# Patient Record
Sex: Female | Born: 1935 | Race: White | Hispanic: No | Marital: Married | Smoking: Former smoker
Health system: Southern US, Community
[De-identification: ages and names within clinical notes are randomized; demographics above are authoritative.]

## PROBLEM LIST (undated history)

## (undated) DIAGNOSIS — E079 Disorder of thyroid, unspecified: Secondary | ICD-10-CM

## (undated) DIAGNOSIS — E785 Hyperlipidemia, unspecified: Secondary | ICD-10-CM

## (undated) DIAGNOSIS — F0280 Dementia in other diseases classified elsewhere without behavioral disturbance: Secondary | ICD-10-CM

## (undated) DIAGNOSIS — I10 Essential (primary) hypertension: Secondary | ICD-10-CM

## (undated) DIAGNOSIS — F0390 Unspecified dementia without behavioral disturbance: Secondary | ICD-10-CM

## (undated) DIAGNOSIS — C801 Malignant (primary) neoplasm, unspecified: Secondary | ICD-10-CM

## (undated) DIAGNOSIS — E46 Unspecified protein-calorie malnutrition: Secondary | ICD-10-CM

## (undated) DIAGNOSIS — G309 Alzheimer's disease, unspecified: Secondary | ICD-10-CM

## (undated) HISTORY — DX: Essential (primary) hypertension: I10

## (undated) HISTORY — DX: Malignant (primary) neoplasm, unspecified: C80.1

## (undated) HISTORY — DX: Unspecified dementia, unspecified severity, without behavioral disturbance, psychotic disturbance, mood disturbance, and anxiety: F03.90

---

## 2011-01-20 HISTORY — PX: MASTECTOMY: SHX3

## 2011-01-20 HISTORY — PX: LUNG REMOVAL, PARTIAL: SHX233

## 2012-06-15 HISTORY — PX: OTHER SURGICAL HISTORY: SHX169

## 2014-08-27 ENCOUNTER — Telehealth: Payer: Self-pay | Admitting: Hematology

## 2014-08-27 NOTE — Telephone Encounter (Signed)
New patient appt-s/w patient dtr [name] and gave np appt for 08/09 @ 1:30 w/Dr. Irene Limbo.

## 2014-08-28 ENCOUNTER — Ambulatory Visit: Payer: Medicare HMO

## 2014-08-28 ENCOUNTER — Encounter: Payer: Self-pay | Admitting: Hematology

## 2014-08-28 ENCOUNTER — Telehealth: Payer: Self-pay | Admitting: Hematology

## 2014-08-28 ENCOUNTER — Ambulatory Visit (HOSPITAL_BASED_OUTPATIENT_CLINIC_OR_DEPARTMENT_OTHER): Payer: Medicare HMO | Admitting: Hematology

## 2014-08-28 VITALS — BP 153/90 | HR 66 | Temp 98.1°F | Resp 18 | Ht 69.0 in | Wt 109.3 lb

## 2014-08-28 DIAGNOSIS — C7800 Secondary malignant neoplasm of unspecified lung: Secondary | ICD-10-CM | POA: Diagnosis not present

## 2014-08-28 DIAGNOSIS — C50911 Malignant neoplasm of unspecified site of right female breast: Secondary | ICD-10-CM

## 2014-08-28 DIAGNOSIS — D0502 Lobular carcinoma in situ of left breast: Secondary | ICD-10-CM

## 2014-08-28 DIAGNOSIS — D0501 Lobular carcinoma in situ of right breast: Secondary | ICD-10-CM

## 2014-08-28 DIAGNOSIS — C50912 Malignant neoplasm of unspecified site of left female breast: Secondary | ICD-10-CM | POA: Insufficient documentation

## 2014-08-28 DIAGNOSIS — C439 Malignant melanoma of skin, unspecified: Secondary | ICD-10-CM | POA: Insufficient documentation

## 2014-08-28 DIAGNOSIS — C799 Secondary malignant neoplasm of unspecified site: Secondary | ICD-10-CM

## 2014-08-28 NOTE — Telephone Encounter (Signed)
Pt confirmed labs/ov per 08/09 POF, gave pt avs and calendar.Cherylann Banas, sent msg to MD about chemo treatment plan and lab orders

## 2014-08-28 NOTE — Progress Notes (Signed)
[facility]  [phone] Fax:([phone]) [phone]  Clinic New Consult Note   Patient Care Team: Derinda Late, MD as PCP - General (Family Medicine) 08/28/2014  CHIEF COMPLAINTS/PURPOSE OF CONSULTATION:  Metastatic melanoma, history of bilateral breast cancer  Oncology History   Metastatic melanoma   Staging form: Melanoma of the Skin, AJCC 7th Edition     Clinical: Stage IV ([place], NX, M1b) - Unsigned          Metastatic melanoma   11/03/2011 Imaging PET CT scan showed hypermetabolic nodule in the lingular, 3 cm, consistent with known metastatic disease, hypermetabolic focus in the medial right thigh, without visible lesion on CT. Mild activity in anterior lateral chest wall favoring postsurgical    2013 Initial Diagnosis Metastatic melanoma   10/21/2011 Initial Biopsy Left lung mass biopsy, tumor is present suggests melanoma.   12/02/2011 Surgery Left alone mass which resection, metastatic melanoma, parenchymal margin negative. Lingular mass, negative margins. [place] staining consistent with melanoma.   01/15/2012 Miscellaneous BRAF and c-kit mutation and lysis were negative   06/29/2012 -  Chemotherapy Yervoy, followed by [facility]ock, and currently on Opdivo    11/21/2013 Surgery Resection of metastatic melanoma in right lateral back and right upper arm, past was consistent with melanoma. Margins were negative.   05/17/2014 Imaging Brain MRI showed diffuse cerebral atrophy with superimposed small vessel ischemia disease stable from prior exam. No evidence of intracranial metastasis.   05/17/2014 Imaging CT of the chest, abdomen and pelvis showed no findings to suggest metastatic disease. Decreased in size of perifascial fat of the right flank, previous right pelvic node no longer seen.    Breast cancer, right breast   09/26/1999 Initial Diagnosis Right breast lumpectomy showed infiltrating lobular carcinoma, grade 2, T2N1, surgical margins were negative. Laboratory  carcinoma in situ.   09/26/1999 Receptors her2 ER 70% positive, PR 10% positive, HER-2 negative.   2001 -  Adjuvant Chemotherapy Adjuvant Adriamycin, Cytoxan and Taxol.   2001 -  Radiation Therapy Adjuvant breast radiation   2002 - 2007 Anti-estrogen oral therapy Tamoxifen for one year, followed by Arimidex which was later on switched to Aromasin due to side effects, for total 5 years.    Breast cancer, left breast   05/28/2011 Initial Diagnosis Breast cancer, left breast   05/28/2011 Initial Biopsy Left breast at 12:00 position biopsy showed ductal carcinoma in situ high-grade, left breast 3 clock position biopsy showed infiltrating lobular carcinoma.   05/28/2011 Receptors her2 ER positive, PR positive, HER-2 negative. Oncotype recurrence score 13   06/19/2011 Surgery Right breast exertion, no cancer. Sentinel lymph node negative. Left breast exertion showed focal invasive lobular carcinoma 8 mm maximal dimension. Grade 1, DCIS present. Total 41 is negative.   HISTORY OF PRESENTING ILLNESS:  Alexa Hardin 81 y.o. female is here to transfer her oncological care to [place] due to the recent relocation. She is accompanied by her husband and daughter to the clinic today.  Please see the summary for her oncological issue above. In summary, she had a stage II of right breast lobular carcinoma in 2001, and stage I left breast lobular carcinoma in 2013, and currently on adjuvant Femara. She was diagnosed with metastatic melanoma to lung, subcutaneous and the lymph nodes in October 2013, status post surgical resection of the lung metastases, and to subcutaneous lesion. She has been on immunotherapy since then, including YervoyX4, followed by [facility], and currently on [name] Sep 2015. She has been tolerating treatment very well, with only mild  fatigue, no significant other side effects.  She used to live with her husband in [place], was recently moved to [place]: To be with her daughter and her  family. Her family members have noticed worsening memory loss, some cognitive dysfunction, and some personality change, such as anger control, in the past several months. One day a few weeks ago, she was found wandering in the parking lot outside of her hotel room at 4:00 in the morning. She needs supervision for her daily activities, such as showering, taking medication. Patient denies any of the above issues, but her conversation was disorganized, and had significant short-term memory issue when I tested her.  She has low to moderate appetite ,  on Megace, eats small portion of female, she lost about 20 lbs in the past two years, No significant weight loss lately. She denies any significant pain, dyspnea, abdominal discomfort, or change of her bowel habits. She is able to ambulatory independently without difficulty. She is not very physically active, but able to tolerate daily routine activity.   MEDICAL HISTORY:  Past Medical History  Diagnosis Date  . Hypertension     SURGICAL HISTORY: Past Surgical History  Procedure Laterality Date  . Lung removal, partial Left 2013    SOCIAL HISTORY: History   Social History  . Marital Status: Married    Spouse Name: N/A  . Number of Children: N/A  . Years of Education: N/A   Occupational History  . Not on file.   Social History Main Topics  . Smoking status: Former Smoker -- 1.50 packs/day for 30 years    Quit date: 01/20/1983  . Smokeless tobacco: Not on file  . Alcohol Use: 0.6 oz/week    1 Glasses of wine per week     Comment: every a few days   . Drug Use: No  . Sexual Activity: Not on file   Other Topics Concern  . Not on file   Social History Narrative  . No narrative on file    FAMILY HISTORY: Family History  Problem Relation Age of Onset  . Cancer Father     lung cancer    ALLERGIES:  is allergic to quinolones.  MEDICATIONS:  Current Outpatient Prescriptions  Medication Sig Dispense Refill  . aspirin 81 MG tablet  Take 81 mg by mouth daily.    Marland Kitchen atenolol (TENORMIN) 50 MG tablet Take 50 mg by mouth daily.    . Cholecalciferol (VITAMIN D3) 1000 UNITS CAPS Take 1 capsule by mouth daily.    Marland Kitchen letrozole (FEMARA) 2.5 MG tablet Take 2.5 mg by mouth daily.    Marland Kitchen levothyroxine (SYNTHROID, LEVOTHROID) 50 MCG tablet Take 50 mcg by mouth daily before breakfast.    . losartan (COZAAR) 100 MG tablet Take 100 mg by mouth daily.    . megestrol (MEGACE ES) 625 MG/5ML suspension Take 625 mg by mouth daily.    Marland Kitchen NIFEdipine (PROCARDIA-XL/ADALAT-CC/NIFEDICAL-XL) 30 MG 24 hr tablet Take 30 mg by mouth daily.    . simvastatin (ZOCOR) 40 MG tablet Take 40 mg by mouth daily.     No current facility-administered medications for this visit.    REVIEW OF SYSTEMS:   Constitutional: Denies fevers, chills or abnormal night sweats Eyes: Denies blurriness of vision, double vision or watery eyes Ears, nose, mouth, throat, and face: Denies mucositis or sore throat Respiratory: Denies cough, dyspnea or wheezes Cardiovascular: Denies palpitation, chest discomfort or lower extremity swelling Gastrointestinal:  Denies nausea, heartburn or change in bowel  habits Skin: Denies abnormal skin rashes Lymphatics: Denies new lymphadenopathy or easy bruising Neurological:Denies numbness, tingling or new weaknesses Behavioral/Psych: Mood is stable, no new changes  All other systems were reviewed with the patient and are negative.  PHYSICAL EXAMINATION: ECOG PERFORMANCE STATUS: 2 - Symptomatic, <50% confined to bed  Filed Vitals:   08/28/14 1539  BP: 153/90  Pulse: 66  Temp: 98.1 F (36.7 C)  Resp: 18   Filed Weights   08/28/14 1539  Weight: 109 lb 4.8 oz (49.578 kg)    GENERAL:alert, no distress and comfortable SKIN: skin color, texture, turgor are normal, no rashes or significant lesions EYES: normal, conjunctiva are pink and non-injected, sclera clear OROPHARYNX:no exudate, no erythema and lips, buccal mucosa, and tongue normal    NECK: supple, thyroid normal size, non-tender, without nodularity LYMPH:  no palpable lymphadenopathy in the cervical, axillary or inguinal LUNGS: clear to auscultation and percussion with normal breathing effort HEART: regular rate & rhythm and no murmurs and no lower extremity edema ABDOMEN:abdomen soft, non-tender and normal bowel sounds Musculoskeletal:no cyanosis of digits and no clubbing  PSYCH: alert & oriented x 3 with fluent speech NEURO: no focal motor/sensory deficits  LABORATORY DATA:  I have reviewed the data as listed  I have reviewed her outside lab from 08/07/2014 CMP within normal limits except BUN 31 TSH normal lipase and LDH normal CBC WBC 6.9, hemoglobin 11.6, platelet 221K  RADIOGRAPHIC STUDIES: I have personally reviewed the radiological images as listed and agreed with the findings in the report. No results found.  ASSESSMENT & PLAN: 81 year old Caucasian female  1. Metastatic melanoma to lung, abdominal wall, subcutaneous soft tissue and lymph nodes -We reviewed her melanoma is incurable at this stage, and the goal of therapy is disease control and prolong her life  -she has been doing very well on immunotherapy. Her last CT scan in April 2016 showed no evidence of disease -I recommend to continue to follow him every 2 weeks, we'll restart next week in our infusion center -The potential side effects, especially immune related reactions, will discuss with her family members again, they voiced good understanding -Giving the underlying dementia and advanced age, she would not be a good candidate for chemotherapy if her disease progress -I'll check her lab CBC, CMP before each treatment, monitor TSH monthly -Repeat CT scans in a month, and every 4-6 months afterwards if she is clinically stable  2. History of bilateral breast lobular carcinoma -She is still on adjuvant Femara, we'll continue, a total 5 years -She is status post bilateral mastectomy, no need  mammogram -I'll follow-up clinically   3. Dementia -She has never been diagnosed, but her clinical symptoms are suggestive of moderate dementia, her last brain MRI showed brain atrophy and ischemic change -I suggest her to get a neurologist referral from her primary care physician  3. Hypertension -Continue follow-up with primary care physician  Plan -lab and restart Nivo in one week -I will see her back in 3 weeks  -lab before each treatment through her port   Orders Placed This Encounter  Procedures  . TSH    Standing Status: Standing     Number of Occurrences: 30     Standing Expiration Date: 08/27/2017  . Comprehensive metabolic panel    Standing Status: Standing     Number of Occurrences: 30     Standing Expiration Date: 08/27/2017  . CBC with Differential/Platelet    Standing Status: Standing     Number of Occurrences:  30     Standing Expiration Date: 08/27/2017    All questions were answered. The patient knows to call the clinic with any problems, questions or concerns. I spent 55 minutes counseling the patient face to face. The total time spent in the appointment was 60 minutes and more than 50% was on counseling.     Truitt Merle, MD 08/28/2014 5:35 PM

## 2014-08-30 ENCOUNTER — Other Ambulatory Visit: Payer: Self-pay | Admitting: *Deleted

## 2014-08-30 ENCOUNTER — Telehealth: Payer: Self-pay | Admitting: *Deleted

## 2014-08-30 ENCOUNTER — Telehealth: Payer: Self-pay | Admitting: Hematology

## 2014-08-30 DIAGNOSIS — C439 Malignant melanoma of skin, unspecified: Secondary | ICD-10-CM

## 2014-08-30 DIAGNOSIS — C799 Secondary malignant neoplasm of unspecified site: Secondary | ICD-10-CM

## 2014-08-30 NOTE — Telephone Encounter (Signed)
Appointments made and daughter aware

## 2014-08-30 NOTE — Telephone Encounter (Signed)
Spoke with daughter [name] and gave her appts for pt to have chest xray done at 1130 am, lab and chemo at 1215 pm on Mon 09/03/14.   Spoke with husband Deidre Ala and gave him same information.  Both [name] and Deidre Ala voiced understanding. [facility]   Phone      [phone].

## 2014-09-03 ENCOUNTER — Other Ambulatory Visit: Payer: Self-pay | Admitting: *Deleted

## 2014-09-03 ENCOUNTER — Ambulatory Visit (HOSPITAL_BASED_OUTPATIENT_CLINIC_OR_DEPARTMENT_OTHER): Payer: Medicare HMO

## 2014-09-03 ENCOUNTER — Ambulatory Visit (HOSPITAL_COMMUNITY)
Admission: RE | Admit: 2014-09-03 | Discharge: 2014-09-03 | Disposition: A | Discharge status: Home / Self Care | Payer: Medicare HMO | Source: Ambulatory Visit | Attending: Hematology | Admitting: Hematology

## 2014-09-03 ENCOUNTER — Ambulatory Visit: Payer: Medicare HMO

## 2014-09-03 ENCOUNTER — Other Ambulatory Visit (HOSPITAL_BASED_OUTPATIENT_CLINIC_OR_DEPARTMENT_OTHER): Payer: Medicare HMO

## 2014-09-03 VITALS — BP 154/70 | HR 56 | Temp 97.5°F | Resp 20

## 2014-09-03 DIAGNOSIS — Z79899 Other long term (current) drug therapy: Secondary | ICD-10-CM

## 2014-09-03 DIAGNOSIS — C50911 Malignant neoplasm of unspecified site of right female breast: Secondary | ICD-10-CM

## 2014-09-03 DIAGNOSIS — C50919 Malignant neoplasm of unspecified site of unspecified female breast: Secondary | ICD-10-CM

## 2014-09-03 DIAGNOSIS — C3490 Malignant neoplasm of unspecified part of unspecified bronchus or lung: Secondary | ICD-10-CM

## 2014-09-03 DIAGNOSIS — C50912 Malignant neoplasm of unspecified site of left female breast: Secondary | ICD-10-CM

## 2014-09-03 DIAGNOSIS — C439 Malignant melanoma of skin, unspecified: Secondary | ICD-10-CM

## 2014-09-03 DIAGNOSIS — I517 Cardiomegaly: Secondary | ICD-10-CM | POA: Diagnosis not present

## 2014-09-03 DIAGNOSIS — C799 Secondary malignant neoplasm of unspecified site: Secondary | ICD-10-CM

## 2014-09-03 DIAGNOSIS — Z5112 Encounter for antineoplastic immunotherapy: Secondary | ICD-10-CM | POA: Diagnosis not present

## 2014-09-03 DIAGNOSIS — R918 Other nonspecific abnormal finding of lung field: Secondary | ICD-10-CM | POA: Diagnosis not present

## 2014-09-03 LAB — COMPREHENSIVE METABOLIC PANEL (CC13)
ALBUMIN: 3.4 g/dL — AB (ref 3.5–5.0)
ALT: 11 U/L (ref 0–55)
AST: 17 U/L (ref 5–34)
Alkaline Phosphatase: 41 U/L (ref 40–150)
Anion Gap: 8 mEq/L (ref 3–11)
BILIRUBIN TOTAL: 1.04 mg/dL (ref 0.20–1.20)
BUN: 22.3 mg/dL (ref 7.0–26.0)
CO2: 22 mEq/L (ref 22–29)
Calcium: 9 mg/dL (ref 8.4–10.4)
Chloride: 109 mEq/L (ref 98–109)
Creatinine: 1 mg/dL (ref 0.6–1.1)
EGFR: 53 mL/min/{1.73_m2} — AB (ref >90)
GLUCOSE: 170 mg/dL — AB (ref 70–140)
Potassium: 3.8 mEq/L (ref 3.5–5.1)
SODIUM: 139 meq/L (ref 136–145)
TOTAL PROTEIN: 6.5 g/dL (ref 6.4–8.3)

## 2014-09-03 LAB — CBC WITH DIFFERENTIAL/PLATELET
BASO%: 1 % (ref 0.0–2.0)
Basophils Absolute: 0.1 10*3/uL (ref 0.0–0.1)
EOS%: 0.2 % (ref 0.0–7.0)
Eosinophils Absolute: 0 10*3/uL (ref 0.0–0.5)
HEMATOCRIT: 38.2 % (ref 34.8–46.6)
HGB: 12.5 g/dL (ref 11.6–15.9)
LYMPH#: 1.4 10*3/uL (ref 0.9–3.3)
LYMPH%: 14.6 % (ref 14.0–49.7)
MCH: 26.8 pg (ref 25.1–34.0)
MCHC: 32.7 g/dL (ref 31.5–36.0)
MCV: 81.9 fL (ref 79.5–101.0)
MONO#: 0.7 10*3/uL (ref 0.1–0.9)
MONO%: 7.6 % (ref 0.0–14.0)
NEUT#: 7.3 10*3/uL — ABNORMAL HIGH (ref 1.5–6.5)
NEUT%: 76.6 % (ref 38.4–76.8)
Platelets: 260 10*3/uL (ref 145–400)
RBC: 4.66 10*6/uL (ref 3.70–5.45)
RDW: 16.2 % — ABNORMAL HIGH (ref 11.2–14.5)
WBC: 9.6 10*3/uL (ref 3.9–10.3)

## 2014-09-03 LAB — TSH CHCC: TSH: 5.43 m[IU]/L — AB (ref 0.308–3.960)

## 2014-09-03 MED ORDER — HEPARIN SOD (PORK) LOCK FLUSH 100 UNIT/ML IV SOLN
500.0000 [IU] | Freq: Once | INTRAVENOUS | Status: AC | PRN
  Administered 2014-09-03: 500 [IU]
  Filled 2014-09-03: qty 5

## 2014-09-03 MED ORDER — SODIUM CHLORIDE 0.9 % IJ SOLN
10.0000 mL | INTRAMUSCULAR | Status: DC | PRN
  Administered 2014-09-03: 10 mL via INTRAVENOUS
  Filled 2014-09-03: qty 10

## 2014-09-03 MED ORDER — MEGESTROL ACETATE 625 MG/5ML PO SUSP
625.0000 mg | Freq: Every day | ORAL | Status: AC
Start: 2014-09-03 — End: ?

## 2014-09-03 MED ORDER — SODIUM CHLORIDE 0.9 % IJ SOLN
10.0000 mL | INTRAMUSCULAR | Status: DC | PRN
  Administered 2014-09-03: 10 mL
  Filled 2014-09-03: qty 10

## 2014-09-03 MED ORDER — NIVOLUMAB CHEMO INJECTION 100 MG/10ML: 3.0000 mg/kg | Freq: Once | INTRAVENOUS | Status: DC

## 2014-09-03 MED ORDER — HEPARIN SOD (PORK) LOCK FLUSH 100 UNIT/ML IV SOLN
500.0000 [IU] | Freq: Once | INTRAVENOUS | Status: DC
  Filled 2014-09-03: qty 5

## 2014-09-03 MED ORDER — SODIUM CHLORIDE 0.9 % IV SOLN
3.2000 mg/kg | Freq: Once | INTRAVENOUS | Status: AC
  Administered 2014-09-03: 160 mg via INTRAVENOUS
  Filled 2014-09-03: qty 16

## 2014-09-03 MED ORDER — SODIUM CHLORIDE 0.9 % IV SOLN
Freq: Once | INTRAVENOUS | Status: AC
  Administered 2014-09-03: 13:00:00 via INTRAVENOUS

## 2014-09-03 MED ORDER — LETROZOLE 2.5 MG PO TABS: 2.5000 mg | ORAL_TABLET | Freq: Every day | ORAL | Status: DC

## 2014-09-03 NOTE — Patient Instructions (Signed)
[facility] Discharge Instructions for Patients Receiving Chemotherapy  Today you received the following chemotherapy agents:  [name].  To help prevent nausea and vomiting after your treatment, we encourage you to take your nausea medication as directed.   If you develop nausea and vomiting that is not controlled by your nausea medication, call the clinic.   BELOW ARE SYMPTOMS THAT SHOULD BE REPORTED IMMEDIATELY:  *FEVER GREATER THAN 100.5 F  *CHILLS WITH OR WITHOUT FEVER  NAUSEA AND VOMITING THAT IS NOT CONTROLLED WITH YOUR NAUSEA MEDICATION  *UNUSUAL SHORTNESS OF BREATH  *UNUSUAL BRUISING OR BLEEDING  TENDERNESS IN MOUTH AND THROAT WITH OR WITHOUT PRESENCE OF ULCERS  *URINARY PROBLEMS  *BOWEL PROBLEMS  UNUSUAL RASH Items with * indicate a potential emergency and should be followed up as soon as possible.  Feel free to call the clinic you have any questions or concerns. The clinic phone number is [phone].  Please show the CHEMO ALERT CARD at check-in to the Emergency Department and triage nurse.   

## 2014-09-03 NOTE — Patient Instructions (Signed)

## 2014-09-04 ENCOUNTER — Encounter: Payer: Self-pay | Admitting: Hematology and Oncology

## 2014-09-17 ENCOUNTER — Ambulatory Visit: Payer: Medicare HMO

## 2014-09-18 ENCOUNTER — Other Ambulatory Visit: Payer: Self-pay | Admitting: *Deleted

## 2014-09-18 ENCOUNTER — Ambulatory Visit: Payer: Medicare HMO

## 2014-09-18 ENCOUNTER — Telehealth: Payer: Self-pay | Admitting: Hematology

## 2014-09-18 ENCOUNTER — Encounter: Payer: Self-pay | Admitting: Hematology

## 2014-09-18 ENCOUNTER — Ambulatory Visit (HOSPITAL_BASED_OUTPATIENT_CLINIC_OR_DEPARTMENT_OTHER): Payer: Medicare HMO

## 2014-09-18 ENCOUNTER — Other Ambulatory Visit (HOSPITAL_BASED_OUTPATIENT_CLINIC_OR_DEPARTMENT_OTHER): Payer: Medicare HMO

## 2014-09-18 ENCOUNTER — Ambulatory Visit (HOSPITAL_BASED_OUTPATIENT_CLINIC_OR_DEPARTMENT_OTHER): Payer: Medicare HMO | Admitting: Hematology

## 2014-09-18 ENCOUNTER — Telehealth: Payer: Self-pay | Admitting: *Deleted

## 2014-09-18 VITALS — BP 122/68 | HR 60 | Resp 18 | Ht 69.0 in | Wt 111.0 lb

## 2014-09-18 DIAGNOSIS — C439 Malignant melanoma of skin, unspecified: Secondary | ICD-10-CM

## 2014-09-18 DIAGNOSIS — C50912 Malignant neoplasm of unspecified site of left female breast: Secondary | ICD-10-CM

## 2014-09-18 DIAGNOSIS — D0502 Lobular carcinoma in situ of left breast: Secondary | ICD-10-CM

## 2014-09-18 DIAGNOSIS — C50911 Malignant neoplasm of unspecified site of right female breast: Secondary | ICD-10-CM

## 2014-09-18 DIAGNOSIS — E039 Hypothyroidism, unspecified: Secondary | ICD-10-CM

## 2014-09-18 DIAGNOSIS — Z79811 Long term (current) use of aromatase inhibitors: Secondary | ICD-10-CM

## 2014-09-18 DIAGNOSIS — C799 Secondary malignant neoplasm of unspecified site: Secondary | ICD-10-CM

## 2014-09-18 DIAGNOSIS — C3490 Malignant neoplasm of unspecified part of unspecified bronchus or lung: Secondary | ICD-10-CM | POA: Diagnosis not present

## 2014-09-18 DIAGNOSIS — D0501 Lobular carcinoma in situ of right breast: Secondary | ICD-10-CM | POA: Diagnosis not present

## 2014-09-18 DIAGNOSIS — Z95828 Presence of other vascular implants and grafts: Secondary | ICD-10-CM

## 2014-09-18 DIAGNOSIS — Z5112 Encounter for antineoplastic immunotherapy: Secondary | ICD-10-CM | POA: Diagnosis not present

## 2014-09-18 LAB — COMPREHENSIVE METABOLIC PANEL (CC13)
ALT: 12 U/L (ref 0–55)
ANION GAP: 8 meq/L (ref 3–11)
AST: 19 U/L (ref 5–34)
Albumin: 3.5 g/dL (ref 3.5–5.0)
Alkaline Phosphatase: 38 U/L — ABNORMAL LOW (ref 40–150)
BILIRUBIN TOTAL: 0.86 mg/dL (ref 0.20–1.20)
BUN: 29.7 mg/dL — ABNORMAL HIGH (ref 7.0–26.0)
CALCIUM: 9 mg/dL (ref 8.4–10.4)
CHLORIDE: 109 meq/L (ref 98–109)
CO2: 24 meq/L (ref 22–29)
Creatinine: 1.3 mg/dL — ABNORMAL HIGH (ref 0.6–1.1)
EGFR: 41 mL/min/{1.73_m2} — AB (ref >90)
Glucose: 104 mg/dl (ref 70–140)
Potassium: 4.3 mEq/L (ref 3.5–5.1)
Sodium: 141 mEq/L (ref 136–145)
Total Protein: 6.6 g/dL (ref 6.4–8.3)

## 2014-09-18 LAB — CBC WITH DIFFERENTIAL/PLATELET
BASO%: 0.6 % (ref 0.0–2.0)
BASOS ABS: 0.1 10*3/uL (ref 0.0–0.1)
EOS ABS: 0 10*3/uL (ref 0.0–0.5)
EOS%: 0.1 % (ref 0.0–7.0)
HEMATOCRIT: 37.7 % (ref 34.8–46.6)
HEMOGLOBIN: 12.4 g/dL (ref 11.6–15.9)
LYMPH#: 1.6 10*3/uL (ref 0.9–3.3)
LYMPH%: 16.9 % (ref 14.0–49.7)
MCH: 26.9 pg (ref 25.1–34.0)
MCHC: 32.8 g/dL (ref 31.5–36.0)
MCV: 81.9 fL (ref 79.5–101.0)
MONO#: 0.8 10*3/uL (ref 0.1–0.9)
MONO%: 8.5 % (ref 0.0–14.0)
NEUT%: 73.9 % (ref 38.4–76.8)
NEUTROS ABS: 7.1 10*3/uL — AB (ref 1.5–6.5)
Platelets: 286 10*3/uL (ref 145–400)
RBC: 4.6 10*6/uL (ref 3.70–5.45)
RDW: 16.6 % — AB (ref 11.2–14.5)
WBC: 9.6 10*3/uL (ref 3.9–10.3)

## 2014-09-18 MED ORDER — SODIUM CHLORIDE 0.9 % IJ SOLN
10.0000 mL | INTRAMUSCULAR | Status: DC | PRN
  Administered 2014-09-18: 10 mL
  Filled 2014-09-18: qty 10

## 2014-09-18 MED ORDER — SODIUM CHLORIDE 0.9 % IJ SOLN
10.0000 mL | INTRAMUSCULAR | Status: DC | PRN
  Administered 2014-09-18: 10 mL via INTRAVENOUS
  Filled 2014-09-18: qty 10

## 2014-09-18 MED ORDER — HEPARIN SOD (PORK) LOCK FLUSH 100 UNIT/ML IV SOLN
500.0000 [IU] | Freq: Once | INTRAVENOUS | Status: AC | PRN
  Administered 2014-09-18: 500 [IU]
  Filled 2014-09-18: qty 5

## 2014-09-18 MED ORDER — SODIUM CHLORIDE 0.9 % IV SOLN
Freq: Once | INTRAVENOUS | Status: AC
  Administered 2014-09-18: 14:00:00 via INTRAVENOUS

## 2014-09-18 MED ORDER — SODIUM CHLORIDE 0.9 % IV SOLN
160.0000 mg | Freq: Once | INTRAVENOUS | Status: AC
  Administered 2014-09-18: 160 mg via INTRAVENOUS
  Filled 2014-09-18: qty 16

## 2014-09-18 NOTE — Patient Instructions (Signed)

## 2014-09-18 NOTE — Progress Notes (Signed)
Pt's last TSH 8/15 was elevated; per Dr. Burr Medico, okay to tx today; Dr. Burr Medico to discuss thyroid issue with pt.

## 2014-09-18 NOTE — Telephone Encounter (Signed)
Added appts...the patient will get printout in chemo

## 2014-09-18 NOTE — Telephone Encounter (Signed)
Per staff message and POF I have scheduled appts. Advised scheduler of appts. JMW  

## 2014-09-18 NOTE — Telephone Encounter (Signed)
per pt spouse to sch flush-sent MW email to sch coodinate trmt w/MD sch-adv pt will call after reply

## 2014-09-18 NOTE — Patient Instructions (Signed)
[facility] Discharge Instructions for Patients Receiving Chemotherapy  Today you received the following chemotherapy agents: [name]  To help prevent nausea and vomiting after your treatment, we encourage you to take your nausea medication as prescribed by your physician.    If you develop nausea and vomiting that is not controlled by your nausea medication, call the clinic.   BELOW ARE SYMPTOMS THAT SHOULD BE REPORTED IMMEDIATELY:  *FEVER GREATER THAN 100.5 F  *CHILLS WITH OR WITHOUT FEVER  NAUSEA AND VOMITING THAT IS NOT CONTROLLED WITH YOUR NAUSEA MEDICATION  *UNUSUAL SHORTNESS OF BREATH  *UNUSUAL BRUISING OR BLEEDING  TENDERNESS IN MOUTH AND THROAT WITH OR WITHOUT PRESENCE OF ULCERS  *URINARY PROBLEMS  *BOWEL PROBLEMS  UNUSUAL RASH Items with * indicate a potential emergency and should be followed up as soon as possible.  Feel free to call the clinic you have any questions or concerns. The clinic phone number is [phone].  Please show the CHEMO ALERT CARD at check-in to the Emergency Department and triage nurse.   

## 2014-09-18 NOTE — Progress Notes (Signed)
[place]  Telephone:([phone]) [phone] [phone]  Clinic follow up Note   Patient Care Team: Derinda Late, MD as PCP - General (Family Medicine) 09/18/2014  CHIEF COMPLAINTS/PURPOSE OF CONSULTATION:  Follow up metastatic melanoma  Oncology History   Metastatic melanoma   Staging form: Melanoma of the Skin, AJCC 7th Edition     Clinical: Stage IV ([place], NX, M1b) - Unsigned          Metastatic melanoma   11/03/2011 Imaging PET CT scan showed hypermetabolic nodule in the lingular, 3 cm, consistent with known metastatic disease, hypermetabolic focus in the medial right thigh, without visible lesion on CT. Mild activity in anterior lateral chest wall favoring postsurgical    2013 Initial Diagnosis Metastatic melanoma   10/21/2011 Initial Biopsy Left lung mass biopsy, tumor is present suggests melanoma.   12/02/2011 Surgery Left alone mass which resection, metastatic melanoma, parenchymal margin negative. Lingular mass, negative margins. [place] staining consistent with melanoma.   01/15/2012 Miscellaneous BRAF and c-kit mutation and lysis were negative   06/29/2012 -  Chemotherapy Yervoy, followed by [facility], and currently on Opdivo    11/21/2013 Surgery Resection of metastatic melanoma in right lateral back and right upper arm, past was consistent with melanoma. Margins were negative.   05/17/2014 Imaging Brain MRI showed diffuse cerebral atrophy with superimposed small vessel ischemia disease stable from prior exam. No evidence of intracranial metastasis.   05/17/2014 Imaging CT of the chest, abdomen and pelvis showed no findings to suggest metastatic disease. Decreased in size of perifascial fat of the right flank, previous right pelvic node no longer seen.    Breast cancer, right breast   09/26/1999 Initial Diagnosis Right breast lumpectomy showed infiltrating lobular carcinoma, grade 2, T2N1, surgical margins were negative. Laboratory carcinoma in situ.   09/26/1999 Receptors her2 ER 70% positive, PR 10% positive, HER-2 negative.   2001 -  Adjuvant Chemotherapy Adjuvant Adriamycin, Cytoxan and Taxol.   2001 -  Radiation Therapy Adjuvant breast radiation   2002 - 2007 Anti-estrogen oral therapy Tamoxifen for one year, followed by Arimidex which was later on switched to Aromasin due to side effects, for total 5 years.    Breast cancer, left breast   05/28/2011 Initial Diagnosis Breast cancer, left breast   05/28/2011 Initial Biopsy Left breast at 12:00 position biopsy showed ductal carcinoma in situ high-grade, left breast 3 clock position biopsy showed infiltrating lobular carcinoma.   05/28/2011 Receptors her2 ER positive, PR positive, HER-2 negative. Oncotype recurrence score 13   06/19/2011 Surgery Right breast exertion, no cancer. Sentinel lymph node negative. Left breast exertion showed focal invasive lobular carcinoma 8 mm maximal dimension. Grade 1, DCIS present. Total 41 is negative.   HISTORY OF PRESENTING ILLNESS:  Alexa Hardin 81 y.o. female is here to transfer her oncological care to [place] due to the recent relocation. She is accompanied by her husband and daughter to the clinic today.  Please see the summary for her oncological issue above. In summary, she had a stage II of right breast lobular carcinoma in 2001, and stage I left breast lobular carcinoma in 2013, and currently on adjuvant Femara. She was diagnosed with metastatic melanoma to lung, subcutaneous and the lymph nodes in October 2013, status post surgical resection of the lung metastases, and to subcutaneous lesion. She has been on immunotherapy since then, including YervoyX4, followed by [facility], and currently on [name] Sep 2015. She has been tolerating treatment very well, with only mild fatigue, no significant other  side effects.  She used to live with her husband in [place], was recently moved to [place] nose: To be with her daughter and her family. Her family  members have noticed worsening memory loss, some cognitive dysfunction, and some personality change, such as anger control, in the past several months. One day a few weeks ago, she was found wandering in the parking lot outside of her hotel room at 4:00 in the morning. She needs supervision for her daily activities, such as showering, taking medication. Patient denies any of the above issues, but her conversation was disorganized, and had significant short-term memory issue when I tested her.  She has low to moderate appetite ,  on Megace, eats small portion of female, she lost about 20 lbs in the past two years, No significant weight loss lately. She denies any significant pain, dyspnea, abdominal discomfort, or change of her bowel habits. She is able to ambulatory independently without difficulty. She is not very physically active, but able to tolerate daily routine activity.   INTERIM HISTORY  Avyn returns for follow-up and second dose Nivo in our cancer center. She is accompanied by her husband. She is doing well overall. She has no noticeable side effects from the Nivo infusion.  She denies any pain, he eats well.  Her memory issues and cognitive dysfunction is stable.  MEDICAL HISTORY:  Past Medical History  Diagnosis Date  . Hypertension     SURGICAL HISTORY: Past Surgical History  Procedure Laterality Date  . Lung removal, partial Left 2013  . Mastectomy Bilateral 2013  . Abdominal wall mass resection  06/15/2012    SOCIAL HISTORY: Social History   Social History  . Marital Status: Married    Spouse Name: N/A  . Number of Children: N/A  . Years of Education: N/A   Occupational History  . Not on file.   Social History Main Topics  . Smoking status: Former Smoker -- 1.50 packs/day for 30 years    Quit date: 01/20/1983  . Smokeless tobacco: Not on file  . Alcohol Use: 0.6 oz/week    1 Glasses of wine per week     Comment: every a few days   . Drug Use: No  . Sexual  Activity: Not on file   Other Topics Concern  . Not on file   Social History Narrative    FAMILY HISTORY: Family History  Problem Relation Age of Onset  . Cancer Father     lung cancer    ALLERGIES:  is allergic to quinolones.  MEDICATIONS:  Current Outpatient Prescriptions  Medication Sig Dispense Refill  . aspirin 81 MG tablet Take 81 mg by mouth daily.    Marland Kitchen atenolol (TENORMIN) 50 MG tablet Take 50 mg by mouth daily.    . Cholecalciferol (VITAMIN D3) 1000 UNITS CAPS Take 1 capsule by mouth daily.    Marland Kitchen letrozole (FEMARA) 2.5 MG tablet Take 1 tablet (2.5 mg total) by mouth daily. 30 tablet 5  . levothyroxine (SYNTHROID, LEVOTHROID) 50 MCG tablet Take 50 mcg by mouth daily before breakfast.    . losartan (COZAAR) 100 MG tablet Take 100 mg by mouth daily.    . megestrol (MEGACE ES) 625 MG/5ML suspension Take 5 mLs (625 mg total) by mouth daily. 150 mL 3  . NIFEdipine (PROCARDIA-XL/ADALAT-CC/NIFEDICAL-XL) 30 MG 24 hr tablet Take 30 mg by mouth daily.    . simvastatin (ZOCOR) 40 MG tablet Take 40 mg by mouth daily.     No current  facility-administered medications for this visit.   Facility-Administered Medications Ordered in Other Visits  Medication Dose Route Frequency Provider Last Rate Last Dose  . sodium chloride 0.9 % injection 10 mL  10 mL Intracatheter PRN Truitt Merle, MD   10 mL at 09/18/14 1507    REVIEW OF SYSTEMS:   Constitutional: Denies fevers, chills or abnormal night sweats Eyes: Denies blurriness of vision, double vision or watery eyes Ears, nose, mouth, throat, and face: Denies mucositis or sore throat Respiratory: Denies cough, dyspnea or wheezes Cardiovascular: Denies palpitation, chest discomfort or lower extremity swelling Gastrointestinal:  Denies nausea, heartburn or change in bowel habits Skin: Denies abnormal skin rashes Lymphatics: Denies new lymphadenopathy or easy bruising Neurological:Denies numbness, tingling or new weaknesses Behavioral/Psych:  Mood is stable, no new changes  All other systems were reviewed with the patient and are negative.  PHYSICAL EXAMINATION: ECOG PERFORMANCE STATUS: 2 - Symptomatic, <50% confined to bed  Filed Vitals:   09/18/14 1247  BP: 122/68  Pulse: 60  Resp: 18   Filed Weights   09/18/14 1247  Weight: 111 lb (50.349 kg)    GENERAL:alert, no distress and comfortable SKIN: skin color, texture, turgor are normal, no rashes or significant lesions EYES: normal, conjunctiva are pink and non-injected, sclera clear OROPHARYNX:no exudate, no erythema and lips, buccal mucosa, and tongue normal  NECK: supple, thyroid normal size, non-tender, without nodularity LYMPH:  no palpable lymphadenopathy in the cervical, axillary or inguinal LUNGS: clear to auscultation and percussion with normal breathing effort HEART: regular rate & rhythm and no murmurs and no lower extremity edema ABDOMEN:abdomen soft, non-tender and normal bowel sounds Musculoskeletal:no cyanosis of digits and no clubbing  PSYCH: alert & oriented x 3 with fluent speech NEURO: no focal motor/sensory deficits  LABORATORY DATA:  I have reviewed the data as listed  I have reviewed her outside lab from 08/07/2014 CMP within normal limits except BUN 31 TSH normal lipase and LDH normal CBC WBC 6.9, hemoglobin 11.6, platelet 221K  RADIOGRAPHIC STUDIES: I have personally reviewed the radiological images as listed and agreed with the findings in the report. Dg Chest 2 View  09/03/2014   CLINICAL DATA:  Metastatic melanoma.  EXAM: CHEST  2 VIEW  COMPARISON:  None.  FINDINGS: Port-A-Cath noted with lead tip at the cavoatrial junction. Mild cardiomegaly normal pulmonary vascularity. Left mid lung and right apical pleural parenchymal thickening is noted most consistent with scarring. Underlying infiltrate and/or mass lesion cannot be excluded. Chest CT can be obtained for further evaluation as clinically indicated. No prominent pleural effusion. No  pneumothorax. No acute bony abnormality  IMPRESSION: 1. Port-A-Cath in good anatomic position . 2. Left mid lung and right apical pleural parenchymal thickening. These changes may be related to scarring. Mass lesion and or infiltrate cannot be excluded. Chest CT can be obtained for further evaluation as clinically indicated. 3. Cardiomegaly.  No pulmonary venous congestion.   Electronically Signed   By: Marcello Moores  Register   On: 09/03/2014 12:05    ASSESSMENT & PLAN: 80 year old Caucasian female  1. Metastatic melanoma to lung, abdominal wall, subcutaneous soft tissue and lymph nodes -We reviewed her melanoma is incurable at this stage, and the goal of therapy is disease control and prolong her life  -she has been doing very well on immunotherapy. Her last CT scan in April 2016 showed no evidence of disease -I recommend to continue to follow him every 2 weeks, we'll restart next week in our infusion center -The potential side effects,  especially immune related reactions, will discuss with her family members again, they voiced good understanding -Giving the underlying dementia and advanced age, she would not be a good candidate for chemotherapy if her disease progress - laboratory reviewed, adequate for treatment, we'll continue Nivo - her TSH is slightly elevated, she has presented his existing hypothyroidism , we'll check her free T3 and T4  2. History of bilateral breast lobular carcinoma -She is still on adjuvant Femara, we'll continue, a total 5 years -She is status post bilateral mastectomy, no need mammogram -I'll follow-up clinically   3. Dementia -She has never been diagnosed, but her clinical symptoms are suggestive of moderate dementia, her last brain MRI showed brain atrophy and ischemic change -I suggest her to get a neurologist referral from her primary care physician,  Her husband is reluctant for her to see a neurologist.  3. Hypertension -Continue follow-up with primary care  physician  Plan -continue Nivo every 2 weeks, treatment today -PET in 3 weeks -I will see her back in 4 weeks    Orders Placed This Encounter  Procedures  . T3, free    Standing Status: Future     Number of Occurrences: 1     Standing Expiration Date: 09/18/2015  . T4, free    Standing Status: Future     Number of Occurrences: 1     Standing Expiration Date: 09/18/2015    All questions were answered. The patient knows to call the clinic with any problems, questions or concerns. I spent 25 minutes counseling the patient face to face. The total time spent in the appointment was 30 minutes and more than 50% was on counseling.     Truitt Merle, MD 09/18/2014 5:56 PM

## 2014-09-18 NOTE — Telephone Encounter (Signed)
per reply from MW they can draw labs/flush in chemo for 9/12-cld Alexa Hardin(spouse and left message ato advise of time & date of 9/12 appt.

## 2014-09-19 LAB — T4, FREE: Free T4: 1.54 ng/dL (ref 0.80–1.80)

## 2014-09-19 LAB — T3, FREE: T3, Free: 2 pg/mL — ABNORMAL LOW (ref 2.3–4.2)

## 2014-10-01 ENCOUNTER — Other Ambulatory Visit (HOSPITAL_BASED_OUTPATIENT_CLINIC_OR_DEPARTMENT_OTHER): Payer: Medicare HMO

## 2014-10-01 ENCOUNTER — Ambulatory Visit (HOSPITAL_BASED_OUTPATIENT_CLINIC_OR_DEPARTMENT_OTHER): Payer: Medicare HMO

## 2014-10-01 ENCOUNTER — Other Ambulatory Visit: Payer: Medicare HMO

## 2014-10-01 VITALS — BP 123/87 | HR 63 | Temp 98.2°F | Resp 20

## 2014-10-01 DIAGNOSIS — C439 Malignant melanoma of skin, unspecified: Secondary | ICD-10-CM

## 2014-10-01 DIAGNOSIS — Z5112 Encounter for antineoplastic immunotherapy: Secondary | ICD-10-CM | POA: Diagnosis not present

## 2014-10-01 DIAGNOSIS — C50911 Malignant neoplasm of unspecified site of right female breast: Secondary | ICD-10-CM

## 2014-10-01 DIAGNOSIS — C799 Secondary malignant neoplasm of unspecified site: Secondary | ICD-10-CM

## 2014-10-01 DIAGNOSIS — C3490 Malignant neoplasm of unspecified part of unspecified bronchus or lung: Secondary | ICD-10-CM

## 2014-10-01 DIAGNOSIS — C50912 Malignant neoplasm of unspecified site of left female breast: Secondary | ICD-10-CM

## 2014-10-01 DIAGNOSIS — Z79899 Other long term (current) drug therapy: Secondary | ICD-10-CM

## 2014-10-01 LAB — CBC WITH DIFFERENTIAL/PLATELET
BASO%: 0.3 % (ref 0.0–2.0)
Basophils Absolute: 0 10*3/uL (ref 0.0–0.1)
EOS%: 0.1 % (ref 0.0–7.0)
Eosinophils Absolute: 0 10*3/uL (ref 0.0–0.5)
HCT: 37 % (ref 34.8–46.6)
HGB: 12.1 g/dL (ref 11.6–15.9)
LYMPH%: 22 % (ref 14.0–49.7)
MCH: 26.9 pg (ref 25.1–34.0)
MCHC: 32.7 g/dL (ref 31.5–36.0)
MCV: 82.4 fL (ref 79.5–101.0)
MONO#: 0.6 10*3/uL (ref 0.1–0.9)
MONO%: 8.2 % (ref 0.0–14.0)
NEUT%: 69.4 % (ref 38.4–76.8)
NEUTROS ABS: 5.4 10*3/uL (ref 1.5–6.5)
PLATELETS: 235 10*3/uL (ref 145–400)
RBC: 4.49 10*6/uL (ref 3.70–5.45)
RDW: 15.7 % — ABNORMAL HIGH (ref 11.2–14.5)
WBC: 7.8 10*3/uL (ref 3.9–10.3)
lymph#: 1.7 10*3/uL (ref 0.9–3.3)

## 2014-10-01 LAB — COMPREHENSIVE METABOLIC PANEL (CC13)
ALBUMIN: 3.6 g/dL (ref 3.5–5.0)
ALK PHOS: 42 U/L (ref 40–150)
ALT: 15 U/L (ref 0–55)
AST: 21 U/L (ref 5–34)
Anion Gap: 7 mEq/L (ref 3–11)
BILIRUBIN TOTAL: 0.85 mg/dL (ref 0.20–1.20)
BUN: 27.2 mg/dL — AB (ref 7.0–26.0)
CALCIUM: 9.2 mg/dL (ref 8.4–10.4)
CO2: 25 mEq/L (ref 22–29)
Chloride: 110 mEq/L — ABNORMAL HIGH (ref 98–109)
Creatinine: 0.9 mg/dL (ref 0.6–1.1)
EGFR: 63 mL/min/{1.73_m2} — AB (ref >90)
GLUCOSE: 87 mg/dL (ref 70–140)
POTASSIUM: 3.7 meq/L (ref 3.5–5.1)
SODIUM: 142 meq/L (ref 136–145)
TOTAL PROTEIN: 6.7 g/dL (ref 6.4–8.3)

## 2014-10-01 LAB — TSH CHCC: TSH: 3.979 m(IU)/L — ABNORMAL HIGH (ref 0.308–3.960)

## 2014-10-01 MED ORDER — SODIUM CHLORIDE 0.9 % IV SOLN
Freq: Once | INTRAVENOUS | Status: AC
  Administered 2014-10-01: 13:00:00 via INTRAVENOUS

## 2014-10-01 MED ORDER — SODIUM CHLORIDE 0.9 % IJ SOLN
10.0000 mL | INTRAMUSCULAR | Status: DC | PRN
  Administered 2014-10-01: 10 mL
  Filled 2014-10-01: qty 10

## 2014-10-01 MED ORDER — HEPARIN SOD (PORK) LOCK FLUSH 100 UNIT/ML IV SOLN
500.0000 [IU] | Freq: Once | INTRAVENOUS | Status: AC | PRN
  Administered 2014-10-01: 500 [IU]
  Filled 2014-10-01: qty 5

## 2014-10-01 MED ORDER — NIVOLUMAB CHEMO INJECTION 100 MG/10ML
3.2000 mg/kg | Freq: Once | INTRAVENOUS | Status: AC
  Administered 2014-10-01: 160 mg via INTRAVENOUS
  Filled 2014-10-01: qty 16

## 2014-10-01 NOTE — Patient Instructions (Signed)
[facility] Discharge Instructions for Patients Receiving Chemotherapy  Today you received the following chemotherapy agents:  [name].  To help prevent nausea and vomiting after your treatment, we encourage you to take your nausea medication as directed.   If you develop nausea and vomiting that is not controlled by your nausea medication, call the clinic.   BELOW ARE SYMPTOMS THAT SHOULD BE REPORTED IMMEDIATELY:  *FEVER GREATER THAN 100.5 F  *CHILLS WITH OR WITHOUT FEVER  NAUSEA AND VOMITING THAT IS NOT CONTROLLED WITH YOUR NAUSEA MEDICATION  *UNUSUAL SHORTNESS OF BREATH  *UNUSUAL BRUISING OR BLEEDING  TENDERNESS IN MOUTH AND THROAT WITH OR WITHOUT PRESENCE OF ULCERS  *URINARY PROBLEMS  *BOWEL PROBLEMS  UNUSUAL RASH Items with * indicate a potential emergency and should be followed up as soon as possible.  Feel free to call the clinic you have any questions or concerns. The clinic phone number is [phone].  Please show the CHEMO ALERT CARD at check-in to the Emergency Department and triage nurse.   

## 2014-10-15 ENCOUNTER — Ambulatory Visit (HOSPITAL_BASED_OUTPATIENT_CLINIC_OR_DEPARTMENT_OTHER): Payer: Medicare HMO | Admitting: Hematology

## 2014-10-15 ENCOUNTER — Other Ambulatory Visit: Payer: Medicare HMO

## 2014-10-15 ENCOUNTER — Ambulatory Visit (HOSPITAL_BASED_OUTPATIENT_CLINIC_OR_DEPARTMENT_OTHER): Payer: Medicare HMO

## 2014-10-15 ENCOUNTER — Other Ambulatory Visit (HOSPITAL_BASED_OUTPATIENT_CLINIC_OR_DEPARTMENT_OTHER): Payer: Medicare HMO

## 2014-10-15 ENCOUNTER — Ambulatory Visit: Payer: Medicare HMO | Admitting: Hematology

## 2014-10-15 ENCOUNTER — Ambulatory Visit: Payer: Medicare HMO

## 2014-10-15 ENCOUNTER — Encounter: Payer: Self-pay | Admitting: Hematology

## 2014-10-15 VITALS — BP 139/54 | HR 77 | Temp 97.8°F | Resp 16 | Ht 69.0 in | Wt 109.0 lb

## 2014-10-15 DIAGNOSIS — C799 Secondary malignant neoplasm of unspecified site: Secondary | ICD-10-CM

## 2014-10-15 DIAGNOSIS — C50911 Malignant neoplasm of unspecified site of right female breast: Secondary | ICD-10-CM

## 2014-10-15 DIAGNOSIS — C439 Malignant melanoma of skin, unspecified: Secondary | ICD-10-CM

## 2014-10-15 DIAGNOSIS — C50919 Malignant neoplasm of unspecified site of unspecified female breast: Secondary | ICD-10-CM

## 2014-10-15 DIAGNOSIS — C50912 Malignant neoplasm of unspecified site of left female breast: Secondary | ICD-10-CM

## 2014-10-15 DIAGNOSIS — Z5112 Encounter for antineoplastic immunotherapy: Secondary | ICD-10-CM

## 2014-10-15 DIAGNOSIS — C3490 Malignant neoplasm of unspecified part of unspecified bronchus or lung: Secondary | ICD-10-CM

## 2014-10-15 LAB — CBC WITH DIFFERENTIAL/PLATELET
BASO%: 0.9 % (ref 0.0–2.0)
BASOS ABS: 0.1 10*3/uL (ref 0.0–0.1)
EOS%: 1.2 % (ref 0.0–7.0)
Eosinophils Absolute: 0.1 10*3/uL (ref 0.0–0.5)
HEMATOCRIT: 34.6 % — AB (ref 34.8–46.6)
HEMOGLOBIN: 11.5 g/dL — AB (ref 11.6–15.9)
LYMPH#: 0.7 10*3/uL — AB (ref 0.9–3.3)
LYMPH%: 12.2 % — ABNORMAL LOW (ref 14.0–49.7)
MCH: 27.2 pg (ref 25.1–34.0)
MCHC: 33.3 g/dL (ref 31.5–36.0)
MCV: 81.6 fL (ref 79.5–101.0)
MONO#: 0.7 10*3/uL (ref 0.1–0.9)
MONO%: 12.1 % (ref 0.0–14.0)
NEUT%: 73.6 % (ref 38.4–76.8)
NEUTROS ABS: 4.2 10*3/uL (ref 1.5–6.5)
Platelets: 228 10*3/uL (ref 145–400)
RBC: 4.24 10*6/uL (ref 3.70–5.45)
RDW: 16.1 % — AB (ref 11.2–14.5)
WBC: 5.8 10*3/uL (ref 3.9–10.3)

## 2014-10-15 LAB — COMPREHENSIVE METABOLIC PANEL (CC13)
ALT: 16 U/L (ref 0–55)
ANION GAP: 8 meq/L (ref 3–11)
AST: 22 U/L (ref 5–34)
Albumin: 3.4 g/dL — ABNORMAL LOW (ref 3.5–5.0)
Alkaline Phosphatase: 57 U/L (ref 40–150)
BILIRUBIN TOTAL: 0.62 mg/dL (ref 0.20–1.20)
BUN: 18.7 mg/dL (ref 7.0–26.0)
CHLORIDE: 107 meq/L (ref 98–109)
CO2: 26 meq/L (ref 22–29)
Calcium: 9.4 mg/dL (ref 8.4–10.4)
Creatinine: 1 mg/dL (ref 0.6–1.1)
EGFR: 56 mL/min/{1.73_m2} — AB (ref >90)
Glucose: 106 mg/dl (ref 70–140)
Potassium: 3.9 mEq/L (ref 3.5–5.1)
Sodium: 141 mEq/L (ref 136–145)
TOTAL PROTEIN: 6.9 g/dL (ref 6.4–8.3)

## 2014-10-15 MED ORDER — HEPARIN SOD (PORK) LOCK FLUSH 100 UNIT/ML IV SOLN
500.0000 [IU] | Freq: Once | INTRAVENOUS | Status: AC | PRN
  Administered 2014-10-15: 500 [IU]
  Filled 2014-10-15: qty 5

## 2014-10-15 MED ORDER — SODIUM CHLORIDE 0.9 % IV SOLN
3.2000 mg/kg | Freq: Once | INTRAVENOUS | Status: AC
  Administered 2014-10-15: 160 mg via INTRAVENOUS
  Filled 2014-10-15: qty 16

## 2014-10-15 MED ORDER — SODIUM CHLORIDE 0.9 % IJ SOLN
10.0000 mL | INTRAMUSCULAR | Status: DC | PRN
  Administered 2014-10-15: 10 mL via INTRAVENOUS
  Filled 2014-10-15: qty 10

## 2014-10-15 MED ORDER — SODIUM CHLORIDE 0.9 % IV SOLN
Freq: Once | INTRAVENOUS | Status: AC
  Administered 2014-10-15: 14:00:00 via INTRAVENOUS

## 2014-10-15 MED ORDER — SODIUM CHLORIDE 0.9 % IJ SOLN
10.0000 mL | INTRAMUSCULAR | Status: DC | PRN
  Administered 2014-10-15: 10 mL
  Filled 2014-10-15: qty 10

## 2014-10-15 NOTE — Progress Notes (Signed)
[place]  [phone] Fax:([phone]) [phone]  Clinic follow up Note   Patient Care Team: Derinda Late, MD as PCP - General (Family Medicine) 10/15/2014  CHIEF COMPLAINTS/PURPOSE OF CONSULTATION:  Follow up metastatic melanoma  Oncology History   Metastatic melanoma   Staging form: Melanoma of the Skin, AJCC 7th Edition     Clinical: Stage IV ([place], NX, M1b) - Unsigned          Metastatic melanoma   11/03/2011 Imaging PET CT scan showed hypermetabolic nodule in the lingular, 3 cm, consistent with known metastatic disease, hypermetabolic focus in the medial right thigh, without visible lesion on CT. Mild activity in anterior lateral chest wall favoring postsurgical    2013 Initial Diagnosis Metastatic melanoma   10/21/2011 Initial Biopsy Left lung mass biopsy, tumor is present suggests melanoma.   12/02/2011 Surgery Left alone mass which resection, metastatic melanoma, parenchymal margin negative. Lingular mass, negative margins. [place] staining consistent with melanoma.   01/15/2012 Miscellaneous BRAF and c-kit mutation and lysis were negative   06/29/2012 -  Chemotherapy Yervoy, followed by [facility]ery[facility] Flock, and currently on Opdivo    11/21/2013 Surgery Resection of metastatic melanoma in right lateral back and right upper arm, past was consistent with melanoma. Margins were negative.   05/17/2014 Imaging Brain MRI showed diffuse cerebral atrophy with superimposed small vessel ischemia disease stable from prior exam. No evidence of intracranial metastasis.   05/17/2014 Imaging CT of the chest, abdomen and pelvis showed no findings to suggest metastatic disease. Decreased in size of perifascial fat of the right flank, previous right pelvic node no longer seen.    Breast cancer, right breast   09/26/1999 Initial Diagnosis Right breast lumpectomy showed infiltrating lobular carcinoma, grade 2, T2N1, surgical margins were negative. Laboratory carcinoma in situ.   09/26/1999 Receptors her2 ER 70% positive, PR 10% positive, HER-2 negative.   2001 -  Adjuvant Chemotherapy Adjuvant Adriamycin, Cytoxan and Taxol.   2001 -  Radiation Therapy Adjuvant breast radiation   2002 - 2007 Anti-estrogen oral therapy Tamoxifen for one year, followed by Arimidex which was later on switched to Aromasin due to side effects, for total 5 years.    Breast cancer, left breast   05/28/2011 Initial Diagnosis Breast cancer, left breast   05/28/2011 Initial Biopsy Left breast at 12:00 position biopsy showed ductal carcinoma in situ high-grade, left breast 3 clock position biopsy showed infiltrating lobular carcinoma.   05/28/2011 Receptors her2 ER positive, PR positive, HER-2 negative. Oncotype recurrence score 13   06/19/2011 Surgery Right breast exertion, no cancer. Sentinel lymph node negative. Left breast exertion showed focal invasive lobular carcinoma 8 mm maximal dimension. Grade 1, DCIS present. Total 41 is negative.   HISTORY OF PRESENTING ILLNESS:  Alexa Hardin 81 y.o. female is here to transfer her oncological care to [place] due to the recent relocation. She is accompanied by her husband and daughter to the clinic today.  Please see the summary for her oncological issue above. In summary, she had a stage II of right breast lobular carcinoma in 2001, and stage I left breast lobular carcinoma in 2013, and currently on adjuvant Femara. She was diagnosed with metastatic melanoma to lung, subcutaneous and the lymph nodes in October 2013, status post surgical resection of the lung metastases, and to subcutaneous lesion. She has been on immunotherapy since then, including YervoyX4, followed by [facility], and currently on [name] Sep 2015. She has been tolerating treatment very well, with only mild fatigue, no significant other  side effects.  She used to live with her husband in [place], was recently moved to [place] nose: To be with her daughter and her family. Her family  members have noticed worsening memory loss, some cognitive dysfunction, and some personality change, such as anger control, in the past several months. One day a few weeks ago, she was found wandering in the parking lot outside of her hotel room at 4:00 in the morning. She needs supervision for her daily activities, such as showering, taking medication. Patient denies any of the above issues, but her conversation was disorganized, and had significant short-term memory issue when I tested her.  She has low to moderate appetite ,  on Megace, eats small portion of female, she lost about 20 lbs in the past two years, No significant weight loss lately. She denies any significant pain, dyspnea, abdominal discomfort, or change of her bowel habits. She is able to ambulatory independently without difficulty. She is not very physically active, but able to tolerate daily routine activity.   INTERIM HISTORY  Alexa Hardin returns for follow-up and new portal bed for treatment. She is doing well overall, she denies any pain, nausea, or other CNS symptoms. She had mild skin rash on her legs after previous nivolumab treatment, resolved on his own. She has a fair appetite and energy level. She does complain about feeling cold, which is  Chronic in nature. No other new complaints.  MEDICAL HISTORY:  Past Medical History  Diagnosis Date  . Hypertension     SURGICAL HISTORY: Past Surgical History  Procedure Laterality Date  . Lung removal, partial Left 2013  . Mastectomy Bilateral 2013  . Abdominal wall mass resection  06/15/2012    SOCIAL HISTORY: Social History   Social History  . Marital Status: Married    Spouse Name: N/A  . Number of Children: N/A  . Years of Education: N/A   Occupational History  . Not on file.   Social History Main Topics  . Smoking status: Former Smoker -- 1.50 packs/day for 30 years    Quit date: 01/20/1983  . Smokeless tobacco: Not on file  . Alcohol Use: 0.6 oz/week    1 Glasses  of wine per week     Comment: every a few days   . Drug Use: No  . Sexual Activity: Not on file   Other Topics Concern  . Not on file   Social History Narrative    FAMILY HISTORY: Family History  Problem Relation Age of Onset  . Cancer Father     lung cancer    ALLERGIES:  is allergic to quinolones.  MEDICATIONS:  Current Outpatient Prescriptions  Medication Sig Dispense Refill  . aspirin 81 MG tablet Take 81 mg by mouth daily.    Marland Kitchen atenolol (TENORMIN) 50 MG tablet Take 50 mg by mouth daily.    . Cholecalciferol (VITAMIN D3) 1000 UNITS CAPS Take 1 capsule by mouth daily.    Marland Kitchen letrozole (FEMARA) 2.5 MG tablet Take 1 tablet (2.5 mg total) by mouth daily. 30 tablet 5  . levothyroxine (SYNTHROID, LEVOTHROID) 50 MCG tablet Take 50 mcg by mouth daily before breakfast.    . losartan (COZAAR) 100 MG tablet Take 100 mg by mouth daily.    Marland Kitchen NIFEdipine (PROCARDIA-XL/ADALAT-CC/NIFEDICAL-XL) 30 MG 24 hr tablet Take 30 mg by mouth daily.    . simvastatin (ZOCOR) 40 MG tablet Take 40 mg by mouth daily.    . megestrol (MEGACE ES) 625 MG/5ML suspension Take 5  mLs (625 mg total) by mouth daily. (Patient not taking: Reported on 10/15/2014) 150 mL 3   No current facility-administered medications for this visit.   Facility-Administered Medications Ordered in Other Visits  Medication Dose Route Frequency Amaru Burroughs Last Rate Last Dose  . sodium chloride 0.9 % injection 10 mL  10 mL Intravenous PRN Truitt Merle, MD   10 mL at 10/15/14 1132    REVIEW OF SYSTEMS:   Constitutional: Denies fevers, chills or abnormal night sweats Eyes: Denies blurriness of vision, double vision or watery eyes Ears, nose, mouth, throat, and face: Denies mucositis or sore throat Respiratory: Denies cough, dyspnea or wheezes Cardiovascular: Denies palpitation, chest discomfort or lower extremity swelling Gastrointestinal:  Denies nausea, heartburn or change in bowel habits Skin: Denies abnormal skin rashes Lymphatics:  Denies new lymphadenopathy or easy bruising Neurological:Denies numbness, tingling or new weaknesses Behavioral/Psych: Mood is stable, no new changes  All other systems were reviewed with the patient and are negative.  PHYSICAL EXAMINATION: ECOG PERFORMANCE STATUS: 2 - Symptomatic, <50% confined to bed  Filed Vitals:   10/15/14 1205  BP: 139/54  Pulse: 77  Temp: 97.8 F (36.6 C)  Resp: 16   Filed Weights   10/15/14 1205  Weight: 109 lb (49.442 kg)    GENERAL:alert, no distress and comfortable SKIN: skin color, texture, turgor are normal, no rashes or significant lesions EYES: normal, conjunctiva are pink and non-injected, sclera clear OROPHARYNX:no exudate, no erythema and lips, buccal mucosa, and tongue normal  NECK: supple, thyroid normal size, non-tender, without nodularity LYMPH:  no palpable lymphadenopathy in the cervical, axillary or inguinal LUNGS: clear to auscultation and percussion with normal breathing effort HEART: regular rate & rhythm and no murmurs and no lower extremity edema ABDOMEN:abdomen soft, non-tender and normal bowel sounds Musculoskeletal:no cyanosis of digits and no clubbing  PSYCH: alert & oriented x 3 with fluent speech NEURO: no focal motor/sensory deficits  LABORATORY DATA:  I have reviewed the data as listed  I have reviewed her outside lab from 08/07/2014 CMP within normal limits except BUN 31 TSH normal lipase and LDH normal CBC WBC 6.9, hemoglobin 11.6, platelet 221K  RADIOGRAPHIC STUDIES: I have personally reviewed the radiological images as listed and agreed with the findings in the report. No results found.  ASSESSMENT & PLAN: 81 year old Caucasian female  1. Metastatic melanoma to lung, abdominal wall, subcutaneous soft tissue and lymph nodes -We reviewed her melanoma is incurable at this stage, and the goal of therapy is disease control and prolong her life  -she has been doing very well on immunotherapy. Her last CT scan in  April 2016 showed no evidence of disease -I recommend to continue to follow him every 2 weeks, we'll restart next week in our infusion center -The potential side effects, especially immune related reactions, will discuss with her family members again, they voiced good understanding -Giving the underlying dementia and advanced age, she would not be a good candidate for chemotherapy if her disease progress - laboratory reviewed, adequate for treatment, we'll continue Nivo -FDA has recently changed the nivolumab dosing in melanoma, from 3 mg/kg to 240 mg flat dose. However, she only weights 50 kg, and I'll will remain her current dose at 3 mg/kg for now.  Burnis Medin repeat PET scan in 1 month. Her last restaging scan was CT scan in April 2016.   2. History of bilateral breast lobular carcinoma -She is still on adjuvant Femara, we'll continue, a total 5 years -She is status post  bilateral mastectomy, no need mammogram -I'll follow-up clinically   3. Dementia -She has never been diagnosed, but her clinical symptoms are suggestive of moderate dementia, her last brain MRI showed brain atrophy and ischemic change -I suggest her to get a neurologist referral from her primary care physician,  Her husband is reluctant for her to see a neurologist.  4. Hypertension -Continue follow-up with primary care physician Dr. Sandi Mariscal   5. Hypothyroidism  -Her TSH is slightly elevated, she has presented his existing hypothyroidism, her free T3 was slightly low, free T4 was normal. I given a copy of the lab results, she is scheduled to see Dr. Sandi Mariscal soom and will discuss to see if she needs to adjust her Synthroid dose   Plan -continue Nivo every 2 weeks, treatment today -PET in 3 weeks -I will see her back in 4 weeks  -I suggest her to get flu shot at her primary care physician's office.   All questions were answered. The patient knows to call the clinic with any problems, questions or concerns. I spent  25 minutes counseling the patient face to face. The total time spent in the appointment was 30 minutes and more than 50% was on counseling.     Truitt Merle, MD 10/15/2014 1:59 PM

## 2014-10-15 NOTE — Patient Instructions (Signed)

## 2014-10-16 ENCOUNTER — Telehealth: Payer: Self-pay | Admitting: Hematology

## 2014-10-16 NOTE — Telephone Encounter (Signed)
Spoke with patients daughter and she is aware of the appointment added on 10/24 and new times

## 2014-10-29 ENCOUNTER — Other Ambulatory Visit (HOSPITAL_BASED_OUTPATIENT_CLINIC_OR_DEPARTMENT_OTHER): Payer: Medicare HMO

## 2014-10-29 ENCOUNTER — Ambulatory Visit: Payer: Medicare HMO

## 2014-10-29 ENCOUNTER — Ambulatory Visit (HOSPITAL_BASED_OUTPATIENT_CLINIC_OR_DEPARTMENT_OTHER): Payer: Medicare HMO

## 2014-10-29 ENCOUNTER — Other Ambulatory Visit: Payer: Medicare HMO

## 2014-10-29 DIAGNOSIS — Z79899 Other long term (current) drug therapy: Secondary | ICD-10-CM

## 2014-10-29 DIAGNOSIS — Z5112 Encounter for antineoplastic immunotherapy: Secondary | ICD-10-CM

## 2014-10-29 DIAGNOSIS — C50912 Malignant neoplasm of unspecified site of left female breast: Secondary | ICD-10-CM

## 2014-10-29 DIAGNOSIS — C799 Secondary malignant neoplasm of unspecified site: Secondary | ICD-10-CM

## 2014-10-29 DIAGNOSIS — C439 Malignant melanoma of skin, unspecified: Secondary | ICD-10-CM

## 2014-10-29 DIAGNOSIS — C50911 Malignant neoplasm of unspecified site of right female breast: Secondary | ICD-10-CM

## 2014-10-29 DIAGNOSIS — C3490 Malignant neoplasm of unspecified part of unspecified bronchus or lung: Secondary | ICD-10-CM

## 2014-10-29 LAB — COMPREHENSIVE METABOLIC PANEL (CC13)
ALT: 12 U/L (ref 0–55)
AST: 21 U/L (ref 5–34)
Albumin: 3.5 g/dL (ref 3.5–5.0)
Alkaline Phosphatase: 55 U/L (ref 40–150)
Anion Gap: 9 mEq/L (ref 3–11)
BUN: 20.2 mg/dL (ref 7.0–26.0)
CO2: 25 meq/L (ref 22–29)
Calcium: 9.4 mg/dL (ref 8.4–10.4)
Chloride: 108 mEq/L (ref 98–109)
Creatinine: 0.9 mg/dL (ref 0.6–1.1)
EGFR: 59 mL/min/{1.73_m2} — AB (ref >90)
GLUCOSE: 89 mg/dL (ref 70–140)
POTASSIUM: 3.9 meq/L (ref 3.5–5.1)
SODIUM: 142 meq/L (ref 136–145)
Total Bilirubin: 0.73 mg/dL (ref 0.20–1.20)
Total Protein: 7 g/dL (ref 6.4–8.3)

## 2014-10-29 LAB — CBC WITH DIFFERENTIAL/PLATELET
BASO%: 1 % (ref 0.0–2.0)
BASOS ABS: 0.1 10*3/uL (ref 0.0–0.1)
EOS%: 0.4 % (ref 0.0–7.0)
Eosinophils Absolute: 0 10*3/uL (ref 0.0–0.5)
HEMATOCRIT: 37.8 % (ref 34.8–46.6)
HEMOGLOBIN: 12.5 g/dL (ref 11.6–15.9)
LYMPH#: 1.6 10*3/uL (ref 0.9–3.3)
LYMPH%: 14.2 % (ref 14.0–49.7)
MCH: 27 pg (ref 25.1–34.0)
MCHC: 32.9 g/dL (ref 31.5–36.0)
MCV: 81.9 fL (ref 79.5–101.0)
MONO#: 1.1 10*3/uL — AB (ref 0.1–0.9)
MONO%: 9.8 % (ref 0.0–14.0)
NEUT#: 8.4 10*3/uL — ABNORMAL HIGH (ref 1.5–6.5)
NEUT%: 74.6 % (ref 38.4–76.8)
PLATELETS: 329 10*3/uL (ref 145–400)
RBC: 4.61 10*6/uL (ref 3.70–5.45)
RDW: 15.7 % — ABNORMAL HIGH (ref 11.2–14.5)
WBC: 11.3 10*3/uL — ABNORMAL HIGH (ref 3.9–10.3)

## 2014-10-29 LAB — TSH CHCC: TSH: 3.387 m[IU]/L (ref 0.308–3.960)

## 2014-10-29 MED ORDER — SODIUM CHLORIDE 0.9 % IJ SOLN
10.0000 mL | INTRAMUSCULAR | Status: DC | PRN
  Administered 2014-10-29: 10 mL
  Filled 2014-10-29: qty 10

## 2014-10-29 MED ORDER — SODIUM CHLORIDE 0.9 % IV SOLN: 3.0000 mg/kg | Freq: Once | INTRAVENOUS | Status: DC

## 2014-10-29 MED ORDER — SODIUM CHLORIDE 0.9 % IV SOLN
Freq: Once | INTRAVENOUS | Status: AC
Start: 2014-10-29 — End: 2014-10-29
  Administered 2014-10-29: 11:00:00 via INTRAVENOUS

## 2014-10-29 MED ORDER — HEPARIN SOD (PORK) LOCK FLUSH 100 UNIT/ML IV SOLN
500.0000 [IU] | Freq: Once | INTRAVENOUS | Status: AC | PRN
  Administered 2014-10-29: 500 [IU]
  Filled 2014-10-29: qty 5

## 2014-10-29 MED ORDER — SODIUM CHLORIDE 0.9 % IV SOLN
3.2000 mg/kg | Freq: Once | INTRAVENOUS | Status: AC
  Administered 2014-10-29: 160 mg via INTRAVENOUS
  Filled 2014-10-29: qty 16

## 2014-10-29 NOTE — Patient Instructions (Addendum)
[facility] Discharge Instructions for Patients Receiving Chemotherapy  Today you received the following chemotherapy agents [name]  To help prevent nausea and vomiting after your treatment, we encourage you to take your nausea medication (none ordered) Call MD office if needed. If you develop nausea and vomiting that is not controlled by your nausea medication, call the clinic.   BELOW ARE SYMPTOMS THAT SHOULD BE REPORTED IMMEDIATELY:  *FEVER GREATER THAN 100.5 F  *CHILLS WITH OR WITHOUT FEVER  NAUSEA AND VOMITING THAT IS NOT CONTROLLED WITH YOUR NAUSEA MEDICATION  *UNUSUAL SHORTNESS OF BREATH  *UNUSUAL BRUISING OR BLEEDING  TENDERNESS IN MOUTH AND THROAT WITH OR WITHOUT PRESENCE OF ULCERS  *URINARY PROBLEMS  *BOWEL PROBLEMS  UNUSUAL RASH Items with * indicate a potential emergency and should be followed up as soon as possible.  Feel free to call the clinic you have any questions or concerns. The clinic phone number is [phone].  Please show the [facility] at check-in to the Emergency Department and triage nurse.

## 2014-11-06 ENCOUNTER — Ambulatory Visit (HOSPITAL_COMMUNITY): Admission: RE | Admit: 2014-11-06 | Payer: Medicare HMO | Source: Ambulatory Visit

## 2014-11-12 ENCOUNTER — Ambulatory Visit: Payer: Medicare HMO

## 2014-11-12 ENCOUNTER — Telehealth: Payer: Self-pay | Admitting: Hematology

## 2014-11-12 ENCOUNTER — Other Ambulatory Visit: Payer: Medicare HMO

## 2014-11-12 ENCOUNTER — Encounter: Payer: Self-pay | Admitting: Hematology

## 2014-11-12 ENCOUNTER — Ambulatory Visit (HOSPITAL_BASED_OUTPATIENT_CLINIC_OR_DEPARTMENT_OTHER): Payer: Medicare HMO

## 2014-11-12 ENCOUNTER — Ambulatory Visit (HOSPITAL_BASED_OUTPATIENT_CLINIC_OR_DEPARTMENT_OTHER): Payer: Medicare HMO | Admitting: Hematology

## 2014-11-12 ENCOUNTER — Other Ambulatory Visit (HOSPITAL_BASED_OUTPATIENT_CLINIC_OR_DEPARTMENT_OTHER): Payer: Medicare HMO

## 2014-11-12 ENCOUNTER — Telehealth: Payer: Self-pay | Admitting: *Deleted

## 2014-11-12 VITALS — BP 174/59 | HR 55 | Temp 97.1°F | Resp 18

## 2014-11-12 VITALS — BP 176/77 | HR 59 | Temp 97.6°F | Resp 18 | Ht 69.0 in | Wt 111.8 lb

## 2014-11-12 DIAGNOSIS — C3490 Malignant neoplasm of unspecified part of unspecified bronchus or lung: Secondary | ICD-10-CM

## 2014-11-12 DIAGNOSIS — C799 Secondary malignant neoplasm of unspecified site: Secondary | ICD-10-CM

## 2014-11-12 DIAGNOSIS — C50912 Malignant neoplasm of unspecified site of left female breast: Secondary | ICD-10-CM

## 2014-11-12 DIAGNOSIS — C50911 Malignant neoplasm of unspecified site of right female breast: Secondary | ICD-10-CM

## 2014-11-12 DIAGNOSIS — C439 Malignant melanoma of skin, unspecified: Secondary | ICD-10-CM

## 2014-11-12 DIAGNOSIS — D0501 Lobular carcinoma in situ of right breast: Secondary | ICD-10-CM

## 2014-11-12 DIAGNOSIS — D0502 Lobular carcinoma in situ of left breast: Secondary | ICD-10-CM | POA: Diagnosis not present

## 2014-11-12 DIAGNOSIS — Z79811 Long term (current) use of aromatase inhibitors: Secondary | ICD-10-CM | POA: Diagnosis not present

## 2014-11-12 DIAGNOSIS — Z5112 Encounter for antineoplastic immunotherapy: Secondary | ICD-10-CM

## 2014-11-12 LAB — COMPREHENSIVE METABOLIC PANEL (CC13)
ALT: 11 U/L (ref 0–55)
ANION GAP: 6 meq/L (ref 3–11)
AST: 18 U/L (ref 5–34)
Albumin: 3.3 g/dL — ABNORMAL LOW (ref 3.5–5.0)
Alkaline Phosphatase: 38 U/L — ABNORMAL LOW (ref 40–150)
BUN: 19.6 mg/dL (ref 7.0–26.0)
CALCIUM: 9.2 mg/dL (ref 8.4–10.4)
CHLORIDE: 110 meq/L — AB (ref 98–109)
CO2: 26 meq/L (ref 22–29)
CREATININE: 0.8 mg/dL (ref 0.6–1.1)
EGFR: 66 mL/min/{1.73_m2} — AB (ref >90)
Glucose: 70 mg/dl (ref 70–140)
POTASSIUM: 3.8 meq/L (ref 3.5–5.1)
Sodium: 141 mEq/L (ref 136–145)
Total Bilirubin: 0.93 mg/dL (ref 0.20–1.20)
Total Protein: 6.5 g/dL (ref 6.4–8.3)

## 2014-11-12 LAB — CBC WITH DIFFERENTIAL/PLATELET
BASO%: 1.2 % (ref 0.0–2.0)
BASOS ABS: 0.1 10*3/uL (ref 0.0–0.1)
EOS ABS: 0.1 10*3/uL (ref 0.0–0.5)
EOS%: 0.8 % (ref 0.0–7.0)
HEMATOCRIT: 36.7 % (ref 34.8–46.6)
HEMOGLOBIN: 12 g/dL (ref 11.6–15.9)
LYMPH#: 1.4 10*3/uL (ref 0.9–3.3)
LYMPH%: 14.9 % (ref 14.0–49.7)
MCH: 27 pg (ref 25.1–34.0)
MCHC: 32.6 g/dL (ref 31.5–36.0)
MCV: 82.9 fL (ref 79.5–101.0)
MONO#: 0.9 10*3/uL (ref 0.1–0.9)
MONO%: 9.8 % (ref 0.0–14.0)
NEUT%: 73.3 % (ref 38.4–76.8)
NEUTROS ABS: 7 10*3/uL — AB (ref 1.5–6.5)
PLATELETS: 276 10*3/uL (ref 145–400)
RBC: 4.43 10*6/uL (ref 3.70–5.45)
RDW: 15.8 % — AB (ref 11.2–14.5)
WBC: 9.5 10*3/uL (ref 3.9–10.3)

## 2014-11-12 MED ORDER — HEPARIN SOD (PORK) LOCK FLUSH 100 UNIT/ML IV SOLN
500.0000 [IU] | Freq: Once | INTRAVENOUS | Status: DC
  Administered 2014-11-12: 500 [IU]
  Filled 2014-11-12: qty 5

## 2014-11-12 MED ORDER — SODIUM CHLORIDE 0.9 % IJ SOLN
10.0000 mL | INTRAMUSCULAR | Status: DC | PRN
  Administered 2014-11-12: 10 mL
  Filled 2014-11-12: qty 10

## 2014-11-12 MED ORDER — SODIUM CHLORIDE 0.9 % IV SOLN
Freq: Once | INTRAVENOUS | Status: AC
  Administered 2014-11-12: 14:00:00 via INTRAVENOUS

## 2014-11-12 MED ORDER — SODIUM CHLORIDE 0.9 % IV SOLN
160.0000 mg | Freq: Once | INTRAVENOUS | Status: AC
  Administered 2014-11-12: 160 mg via INTRAVENOUS
  Filled 2014-11-12: qty 16

## 2014-11-12 MED ORDER — SODIUM CHLORIDE 0.9 % IJ SOLN
10.0000 mL | Freq: Once | INTRAMUSCULAR | Status: AC
  Administered 2014-11-12: 10 mL
  Filled 2014-11-12: qty 10

## 2014-11-12 MED ORDER — HEPARIN SOD (PORK) LOCK FLUSH 100 UNIT/ML IV SOLN
500.0000 [IU] | Freq: Once | INTRAVENOUS | Status: DC | PRN
  Filled 2014-11-12: qty 5

## 2014-11-12 NOTE — Telephone Encounter (Signed)
Per staff message and POF I have scheduled appts. Advised scheduler of appts. JMW  

## 2014-11-12 NOTE — Progress Notes (Signed)
[place]  [phone] [phone]  Clinic follow up Note   Patient Care Team: Derinda Late, MD as PCP - General (Family Medicine) 11/12/2014  CHIEF COMPLAINTS/PURPOSE OF CONSULTATION:  Follow up metastatic melanoma  Oncology History   Metastatic melanoma   Staging form: Melanoma of the Skin, AJCC 7th Edition     Clinical: Stage IV ([place], NX, M1b) - Unsigned          Metastatic melanoma ([place])   11/03/2011 Imaging PET CT scan showed hypermetabolic nodule in the lingular, 3 cm, consistent with known metastatic disease, hypermetabolic focus in the medial right thigh, without visible lesion on CT. Mild activity in anterior lateral chest wall favoring postsurgical    2013 Initial Diagnosis Metastatic melanoma   10/21/2011 Initial Biopsy Left lung mass biopsy, tumor is present suggests melanoma.   12/02/2011 Surgery Left alone mass which resection, metastatic melanoma, parenchymal margin negative. Lingular mass, negative margins. [place] staining consistent with melanoma.   01/15/2012 Miscellaneous BRAF and c-kit mutation and lysis were negative   06/29/2012 -  Chemotherapy Yervoy, followed by Beryle Flock, and currently on Opdivo    11/21/2013 Surgery Resection of metastatic melanoma in right lateral back and right upper arm, past was consistent with melanoma. Margins were negative.   05/17/2014 Imaging Brain MRI showed diffuse cerebral atrophy with superimposed small vessel ischemia disease stable from prior exam. No evidence of intracranial metastasis.   05/17/2014 Imaging CT of the chest, abdomen and pelvis showed no findings to suggest metastatic disease. Decreased in size of perifascial fat of the right flank, previous right pelvic node no longer seen.    Relapse/Recurrence     Chemotherapy     Breast cancer, right breast ([place])   09/26/1999 Initial Diagnosis Right breast lumpectomy showed infiltrating lobular carcinoma, grade 2, T2N1, surgical margins  were negative. Laboratory carcinoma in situ.   09/26/1999 Receptors her2 ER 70% positive, PR 10% positive, HER-2 negative.   2001 -  Adjuvant Chemotherapy Adjuvant Adriamycin, Cytoxan and Taxol.   2001 -  Radiation Therapy Adjuvant breast radiation   2002 - 2007 Anti-estrogen oral therapy Tamoxifen for one year, followed by Arimidex which was later on switched to Aromasin due to side effects, for total 5 years.    Breast cancer, left breast ([place])   05/28/2011 Initial Diagnosis Breast cancer, left breast   05/28/2011 Initial Biopsy Left breast at 12:00 position biopsy showed ductal carcinoma in situ high-grade, left breast 3 clock position biopsy showed infiltrating lobular carcinoma.   05/28/2011 Receptors her2 ER positive, PR positive, HER-2 negative. Oncotype recurrence score 13   06/19/2011 Surgery Right breast exertion, no cancer. Sentinel lymph node negative. Left breast exertion showed focal invasive lobular carcinoma 8 mm maximal dimension. Grade 1, DCIS present. Total 41 is negative.   HISTORY OF PRESENTING ILLNESS:  Alexa Hardin 81 y.o. female is here to transfer her oncological care to [place] due to the recent relocation. She is accompanied by her husband and daughter to the clinic today.  Please see the summary for her oncological issue above. In summary, she had a stage II of right breast lobular carcinoma in 2001, and stage I left breast lobular carcinoma in 2013, and currently on adjuvant Femara. She was diagnosed with metastatic melanoma to lung, subcutaneous and the lymph nodes in October 2013, status post surgical resection of the lung metastases, and to subcutaneous lesion. She has been on immunotherapy since then, including YervoyX4, followed by [facility] and currently on [name] Sep 2015.  She has been tolerating treatment very well, with only mild fatigue, no significant other side effects.  She used to live with her husband in [place], was recently moved to [place]:  To be with her daughter and her family. Her family members have noticed worsening memory loss, some cognitive dysfunction, and some personality change, such as anger control, in the past several months. One day a few weeks ago, she was found wandering in the parking lot outside of her hotel room at 4:00 in the morning. She needs supervision for her daily activities, such as showering, taking medication. Patient denies any of the above issues, but her conversation was disorganized, and had significant short-term memory issue when I tested her.  She has low to moderate appetite ,  on Megace, eats small portion of female, she lost about 20 lbs in the past two years, No significant weight loss lately. She denies any significant pain, dyspnea, abdominal discomfort, or change of her bowel habits. She is able to ambulatory independently without difficulty. She is not very physically active, but able to tolerate daily routine activity.   INTERIM HISTORY  Particia returns for follow-up. She is accompanied to the clinic by her husband. She denies any pain, nausea, or other complaints. She has decent appetite and eating well. Per her husband, she is doing well overall, except her dementia and memory issue, and she needs 24-hour supervision. She is able to do some of her ADLs.    MEDICAL HISTORY:  Past Medical History  Diagnosis Date  . Hypertension     SURGICAL HISTORY: Past Surgical History  Procedure Laterality Date  . Lung removal, partial Left 2013  . Mastectomy Bilateral 2013  . Abdominal wall mass resection  06/15/2012    SOCIAL HISTORY: Social History   Social History  . Marital Status: Married    Spouse Name: N/A  . Number of Children: N/A  . Years of Education: N/A   Occupational History  . Not on file.   Social History Main Topics  . Smoking status: Former Smoker -- 1.50 packs/day for 30 years    Quit date: 01/20/1983  . Smokeless tobacco: Not on file  . Alcohol Use: 0.6 oz/week     1 Glasses of wine per week     Comment: every a few days   . Drug Use: No  . Sexual Activity: Not on file   Other Topics Concern  . Not on file   Social History Narrative    FAMILY HISTORY: Family History  Problem Relation Age of Onset  . Cancer Father     lung cancer    ALLERGIES:  is allergic to quinolones.  MEDICATIONS:  Current Outpatient Prescriptions  Medication Sig Dispense Refill  . aspirin 81 MG tablet Take 81 mg by mouth daily.    Marland Kitchen atenolol (TENORMIN) 50 MG tablet Take 50 mg by mouth daily.    . Cholecalciferol (VITAMIN D3) 1000 UNITS CAPS Take 1 capsule by mouth daily.    Marland Kitchen letrozole (FEMARA) 2.5 MG tablet Take 1 tablet (2.5 mg total) by mouth daily. 30 tablet 5  . levothyroxine (SYNTHROID, LEVOTHROID) 50 MCG tablet Take 50 mcg by mouth daily before breakfast.    . losartan (COZAAR) 100 MG tablet Take 100 mg by mouth daily.    . megestrol (MEGACE ES) 625 MG/5ML suspension Take 5 mLs (625 mg total) by mouth daily. 150 mL 3  . simvastatin (ZOCOR) 40 MG tablet Take 40 mg by mouth daily.  No current facility-administered medications for this visit.    REVIEW OF SYSTEMS:   Constitutional: Denies fevers, chills or abnormal night sweats Eyes: Denies blurriness of vision, double vision or watery eyes Ears, nose, mouth, throat, and face: Denies mucositis or sore throat Respiratory: Denies cough, dyspnea or wheezes Cardiovascular: Denies palpitation, chest discomfort or lower extremity swelling Gastrointestinal:  Denies nausea, heartburn or change in bowel habits Skin: Denies abnormal skin rashes Lymphatics: Denies new lymphadenopathy or easy bruising Neurological:Denies numbness, tingling or new weaknesses Behavioral/Psych: Mood is stable, no new changes  All other systems were reviewed with the patient and are negative.  PHYSICAL EXAMINATION: ECOG PERFORMANCE STATUS: 2 - Symptomatic, <50% confined to bed  Filed Vitals:   11/12/14 1220  BP: 176/77  Pulse:  59  Temp: 97.6 F (36.4 C)  Resp: 18   Filed Weights   11/12/14 1220  Weight: 111 lb 12.8 oz (50.712 kg)    GENERAL:alert, no distress and comfortable SKIN: skin color, texture, turgor are normal, no rashes or significant lesions EYES: normal, conjunctiva are pink and non-injected, sclera clear OROPHARYNX:no exudate, no erythema and lips, buccal mucosa, and tongue normal  NECK: supple, thyroid normal size, non-tender, without nodularity LYMPH:  no palpable lymphadenopathy in the cervical, axillary or inguinal LUNGS: clear to auscultation and percussion with normal breathing effort HEART: regular rate & rhythm and no murmurs and no lower extremity edema ABDOMEN:abdomen soft, non-tender and normal bowel sounds Musculoskeletal:no cyanosis of digits and no clubbing  PSYCH: alert & oriented x 3 with fluent speech NEURO: no focal motor/sensory deficits  LABORATORY DATA:  I have reviewed the data as listed  I have reviewed her outside lab from 08/07/2014 CMP within normal limits except BUN 31 TSH normal lipase and LDH normal CBC WBC 6.9, hemoglobin 11.6, platelet 221K  RADIOGRAPHIC STUDIES: I have personally reviewed the radiological images as listed and agreed with the findings in the report. No results found.  ASSESSMENT & PLAN: 81 year old Caucasian female  1. Metastatic melanoma to lung, abdominal wall, subcutaneous soft tissue and lymph nodes -We reviewed her melanoma is incurable at this stage, and the goal of therapy is disease control and prolong her life  -she has been doing very well on immunotherapy. Her last CT scan in April 2016 showed no evidence of disease -I recommend to continue Nivo, she is tolerating well  -The potential side effects, especially immune related reactions, was discuss with her family members again, they voiced good understanding -Giving the underlying dementia and advanced age, she would not be a good candidate for chemotherapy if her disease  progress - laboratory reviewed, adequate for treatment, we'll continue Nivo -FDA has recently changed the nivolumab dosing in melanoma, from 3 mg/kg to 240 mg flat dose. However, she only weights 50 kg, and I'll will remain her current dose at 3 mg/kg for now.  -Her restaging PET scan is scheduled for later this week.  2. History of bilateral breast lobular carcinoma -She is still on adjuvant Femara, we'll continue, a total 5 years -She is status post bilateral mastectomy, no need mammogram -I'll follow-up clinically   3. Dementia -She has never been diagnosed, but her clinical symptoms are suggestive of moderate dementia, her last brain MRI showed brain atrophy and ischemic change -she is scheduled to see a neurologist soon   4. Hypertension -Continue follow-up with primary care physician Dr. Sandi Mariscal   5. Hypothyroidism  -Her TSH has been normal lately -she will follow up with PCP  Plan -continue Nivo every 2 weeks, treatment today -PET later this week -I will see her back in 2 weeks   All questions were answered. The patient knows to call the clinic with any problems, questions or concerns. I spent 20 minutes counseling the patient face to face. The total time spent in the appointment was 20 minutes and more than 50% was on counseling.     Truitt Merle, MD 11/12/2014 10:51 PM

## 2014-11-12 NOTE — Telephone Encounter (Signed)
per pof to sch pt appt-gave pt copy of avs-sent MW email to sch trmt-willc all pt after reply

## 2014-11-12 NOTE — Patient Instructions (Signed)
[facility] Discharge Instructions for Patients Receiving Chemotherapy  Today you received the following chemotherapy agents [name].  To help prevent nausea and vomiting after your treatment, we encourage you to take your nausea medication as prescribed.   If you develop nausea and vomiting that is not controlled by your nausea medication, call the clinic.   BELOW ARE SYMPTOMS THAT SHOULD BE REPORTED IMMEDIATELY:  *FEVER GREATER THAN 100.5 F  *CHILLS WITH OR WITHOUT FEVER  NAUSEA AND VOMITING THAT IS NOT CONTROLLED WITH YOUR NAUSEA MEDICATION  *UNUSUAL SHORTNESS OF BREATH  *UNUSUAL BRUISING OR BLEEDING  TENDERNESS IN MOUTH AND THROAT WITH OR WITHOUT PRESENCE OF ULCERS  *URINARY PROBLEMS  *BOWEL PROBLEMS  UNUSUAL RASH Items with * indicate a potential emergency and should be followed up as soon as possible.  Feel free to call the clinic you have any questions or concerns. The clinic phone number is [phone].  Please show the CHEMO ALERT CARD at check-in to the Emergency Department and triage nurse.   

## 2014-11-12 NOTE — Telephone Encounter (Signed)
per pof to sch pt appt-sent MW email to sch trmt-pt to get updated copy of avs b4 leaving trrnt room

## 2014-11-13 ENCOUNTER — Ambulatory Visit (INDEPENDENT_AMBULATORY_CARE_PROVIDER_SITE_OTHER): Payer: Medicare HMO | Admitting: Neurology

## 2014-11-13 ENCOUNTER — Telehealth: Payer: Self-pay | Admitting: Hematology

## 2014-11-13 ENCOUNTER — Encounter: Payer: Self-pay | Admitting: Neurology

## 2014-11-13 VITALS — BP 192/95 | HR 64 | Ht 69.0 in | Wt 112.6 lb

## 2014-11-13 DIAGNOSIS — F03918 Unspecified dementia, unspecified severity, with other behavioral disturbance: Secondary | ICD-10-CM

## 2014-11-13 DIAGNOSIS — F0391 Unspecified dementia with behavioral disturbance: Secondary | ICD-10-CM | POA: Diagnosis not present

## 2014-11-13 DIAGNOSIS — G301 Alzheimer's disease with late onset: Secondary | ICD-10-CM | POA: Diagnosis not present

## 2014-11-13 DIAGNOSIS — J441 Chronic obstructive pulmonary disease with (acute) exacerbation: Secondary | ICD-10-CM

## 2014-11-13 DIAGNOSIS — F0281 Dementia in other diseases classified elsewhere with behavioral disturbance: Secondary | ICD-10-CM | POA: Diagnosis not present

## 2014-11-13 MED ORDER — DONEPEZIL HCL 5 MG PO TABS: 5.0000 mg | ORAL_TABLET | Freq: Every day | ORAL | Status: DC

## 2014-11-13 MED ORDER — DONEPEZIL HCL 10 MG PO TABS: 10.0000 mg | ORAL_TABLET | Freq: Every day | ORAL | Status: DC

## 2014-11-13 NOTE — Progress Notes (Addendum)
[facility]    Provider:  Dr Jaynee Eagles Referring Provider: Derinda Late, MD Primary Care Physician:  Marylene Land, MD  CC:  Memory loss  HPI:  Alexa Hardin is a 81 y.o. female here as a referral from Dr. Sandi Mariscal for memory loss. PMHx HTN, hypothyroidism, COPD, hyperlipidemia and dementia.  She was diagnosed as having metastatic malignant melanoma in 2013 initially in the right lung resected in 2013. In 2015 she had a resection of the metastatic melanoma of the right lateral back and upper arm with negative margins. She also has a history of breast cancer, infiltrating lobular carcinoma of the right breast and had a lumpectomy in 2001 status post chemotherapy and breast radiation. In 2013 she developed ductal carcinoma of the left breast and had bilateral mastectomies performed in 2013. She is here with her husband and daughter who provide all information. Patient and husband recently moved from [place] to be near daughter. The memory changes started at least 2 years ago or longer. The daughter has noticed a more significant decline in the last year. Even the last several months they have noticed fairly rapid decrease of functionality especially in executive function and short-term memory. Daughter first noticed memory changes in 2013 and since then it has very much declined. It started as forgetting peoples names and not remembering the grandchildrens' names, mixing up the names. Started with recent memories and then progressed to forgetting family birthdays. Patient became less social about a year ago. The activities of daily living started are impaired. Difficulty cooking, putting plastic on the cooktop. Difficulty following directions and putting tasks together. She had lost weight. She is having difficulty with showering and getting dressed. She is not cooking, not cleaning. She gets frustrated. Patient has some anger issues, some frustration, episodic agitation. Never  violent. She got out of the car in one incident, she wouldn't get back into the car and the police were dispatched. Then she later rode home with husband without a problem. There is a lot of frustration. No falls at home. No hallucinations. There are delusions. Sleeping is better, she is on a sleep regimen and wandering in the middle of the night has resolved. They would like OT evaluations, PT and speech therapy.   Reviewed notes, labs and imaging from outside physicians, which showed: Per notes from Dr. 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 She is on aspirin 81 mg daily for stroke prevention. She was diagnosed as having metastatic malignant melanoma in 2013 initially in the right lung resected in 2013. In 2015 she had a resection of the metastatic melanoma of the right lateral back and upper arm with negative margins. The brain MRI April did not show any metastatic lesions. She's been receiving immunotherapy since the diagnosis was made with opdivo 153.6 mg every 2 weeks. She also has a history of breast cancer, infiltrating lobular carcinoma of the right breast and had a lumpectomy in 2001 status post chemotherapy and breast radiation. In 2013 she developed ductal carcinoma of the left breast and had bilateral mastectomies performed in 2013. She remains on Letrozole  2.5 mg daily. Last EKG showed a right bundle branch block, right axis deviation, nonspecific T-wave changes. She has hypothyroidism and hypertension. She also has COPD and hyperlipidemia.  Review of Systems: Patient complains of symptoms per HPI as well as the following symptoms: Weight loss, fatigue, feeling cold, rash, anxiety, memory loss, confusion, change  in appetite.. Pertinent negatives per HPI. All others negative.   Social History   Social History  . Marital Status: Married    Spouse Name: Deidre Ala  . Number of Children: 1  . Years of Education: 16    Occupational History  . 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 Smoking status: Former Smoker -- 1.50 packs/day for 30 years    Quit date: 01/20/1983  . Smokeless tobacco: Not on file  . Alcohol Use: 0.6 oz/week    1 Glasses of wine per week     Comment: every a few days   . Drug Use: No  . Sexual Activity: Not on file   Other Topics Concern  . Not on file   Social History Narrative   Lives at home with husband and daughter/son-in law   Caffeine use: 1 cup per day    Family History  Problem Relation Age of Onset  . Cancer Father     lung cancer  . Dementia Neg Hx     Past Medical History  Diagnosis Date  . Hypertension   . Cancer ([place])     Breast/melanoma  . Dementia     Past Surgical History  Procedure Laterality Date  . Lung removal, partial Left 2013  . Mastectomy Bilateral 2013  . Abdominal wall mass resection  06/15/2012    Current Outpatient Prescriptions  Medication Sig Dispense Refill  . aspirin 81 MG tablet Take 81 mg by mouth daily.    Marland Kitchen atenolol (TENORMIN) 50 MG tablet Take 50 mg by mouth daily.    . Cholecalciferol (VITAMIN D3) 1000 UNITS CAPS Take 1 capsule by mouth daily.    Marland Kitchen letrozole (FEMARA) 2.5 MG tablet Take 1 tablet (2.5 mg total) by mouth daily. 30 tablet 5  . levothyroxine (SYNTHROID, LEVOTHROID) 50 MCG tablet Take 50 mcg by mouth daily before breakfast.    . losartan (COZAAR) 100 MG tablet Take 100 mg by mouth daily.    . megestrol (MEGACE ES) 625 MG/5ML suspension Take 5 mLs (625 mg total) by mouth daily. 150 mL 3  . simvastatin (ZOCOR) 40 MG tablet Take 40 mg by mouth daily.    Marland Kitchen donepezil (ARICEPT) 10 MG tablet Take 1 tablet (10 mg total) by mouth at bedtime. 30 tablet 3  . donepezil (ARICEPT) 5 MG tablet Take 1 tablet (5 mg total) by mouth at bedtime. 30 tablet 0   No current facility-administered medications for this visit.    Allergies as of 11/13/2014 - Review Complete 11/12/2014  Allergen  Reaction Noted  . Quinolones Rash 08/28/2014    Vitals: BP 192/95 mmHg  Pulse 64  Ht '5\' 9"'$  (1.753 m)  Wt 112 lb 9.6 oz (51.075 kg)  BMI 16.62 kg/m2 Last Weight:  Wt Readings from Last 1 Encounters:  11/13/14 112 lb 9.6 oz (51.075 kg)   Last Height:   Ht Readings from Last 1 Encounters:  11/13/14 '5\' 9"'$  (1.753 m)    Physical exam: Exam: Gen: NAD, well groomed                     CV: RRR, no MRG. No Carotid Bruits. No peripheral edema, warm, nontender Eyes: Conjunctivae clear without exudates or hemorrhage  Neuro: Detailed Neurologic Exam  Speech:    Speech is fluent and spontaneous with impaired comprehension due to dementia Cognition: Montreal Cognitive Assessment  11/13/2014  Visuospatial/ Executive (0/5) 0  Naming (0/3) 1  Attention:  Read list of digits (0/2) 1  Attention: Read list of letters (0/1) 0  Attention: Serial 7 subtraction starting at 100 (0/3) 0  Language: Repeat phrase (0/2) 1  Language : Fluency (0/1) 0  Abstraction (0/2) 1  Delayed Recall (0/5) 0  Orientation (0/6) 0  Total 4  Adjusted Score (based on education) 5    Cranial Nerves:    The pupils are equal, round, and reactive to light. Attempted funduscopic exam could not visualize due to small pupils. Visual fields are full to threat bilaterally. Extraocular movements are intact. Trigeminal sensation is intact and the muscles of mastication are normal. The face is symmetric. The palate elevates in the midline. Hearing intact to voice. Voice is normal. Shoulder shrug is normal. The tongue has normal motion without fasciculations.   Coordination:    No dysmetria noted  Gait:    Not ataxic  Motor Observation:    No asymmetry, no atrophy, and no involuntary movements noted. Tone:    Normal muscle tone.    Posture:    Posture is normal. normal erect    Strength:    Strength is V/V in the upper and lower limbs.      Sensation: intact to LT     Reflex Exam:  DTR's:    Deep tendon  reflexes in the upper extremities are brisk bilaterally and in the lower extremities are normal. Toes: The right toe is upgoing.   Clonus:    Clonus is absent.   Assessment/Plan:   81 y.o. female here as a referral from Dr. Sandi Mariscal for memory loss. PMHx HTN, hypothyroidism, COPD, hyperlipidemia and dementia.  She was diagnosed as having metastatic malignant melanoma in 2013 initially in the right lung resected in 2013. In 2015 she had a resection of the metastatic melanoma of the right lateral back and upper arm with negative margins. She also has a history of breast cancer, infiltrating lobular carcinoma of the right breast and had a lumpectomy in 2001 status post chemotherapy and breast radiation. In 2013 she developed ductal carcinoma of the left breast and had bilateral mastectomies performed in 2013. Patient has advanced dementia with a [place] cognitive assessment score of 5/30. She has severe impairment in difficulties with even basic activities of daily living such as showering and dressing. Dementia is likely of the Alzheimer's type. Patient cannot leave the home without assistance. Would benefit from physical therapy and occupational therapy within the home as well as speech therapy. We will ask Alvis Lemmings to evaluate patient for home services. We'll request MRI CD of the brain so that I can evaluate it. We'll order B12 and folate panel. We'll have family back in 3 months.  Received mri report: diffuse cerebral atrophy with superimposed small-vessel ischemic disease, stable from prior exam 09/2013. Stable left parietal encephalomalacia.   CC: Dr. Glenna Durand, [facility] [address] [place], [place] [address]  Phone [phone] Fax [phone]

## 2014-11-13 NOTE — Telephone Encounter (Signed)
per pof to sch pt appt-cld * spoke to pt daughter [name] and adv of appt time & date-stated she would writen them down

## 2014-11-13 NOTE — Patient Instructions (Addendum)
As far as your medications are concerned, I would like to suggest: Start Aricept 5 mg a day In one month can start Aricept '10mg'$  daily At next appointment will start Namenda  As far as diagnostic testing: Lab, need MRI of the brain  Will request [facility] for in-home services  I would like to see you back in 3 months, sooner if we need to. Please call us with any interim questions, concerns, problems, updates or refill requests.   Please also call us for any test results so we can go over those with you on the phone.  My clinical assistant and will answer any of your questions and relay your messages to me and also relay most of my messages to you.   Our phone number is [phone]. We also have an after hours call service for urgent matters and there is a physician on-call for urgent questions. For any emergencies you know to call 911 or go to the nearest emergency room

## 2014-11-14 ENCOUNTER — Encounter: Payer: Self-pay | Admitting: Neurology

## 2014-11-14 ENCOUNTER — Telehealth: Payer: Self-pay | Admitting: Neurology

## 2014-11-14 DIAGNOSIS — F0280 Dementia in other diseases classified elsewhere without behavioral disturbance: Secondary | ICD-10-CM | POA: Insufficient documentation

## 2014-11-14 DIAGNOSIS — F0391 Unspecified dementia with behavioral disturbance: Secondary | ICD-10-CM | POA: Insufficient documentation

## 2014-11-14 DIAGNOSIS — J449 Chronic obstructive pulmonary disease, unspecified: Secondary | ICD-10-CM | POA: Insufficient documentation

## 2014-11-14 DIAGNOSIS — G309 Alzheimer's disease, unspecified: Secondary | ICD-10-CM

## 2014-11-14 DIAGNOSIS — F0281 Dementia in other diseases classified elsewhere with behavioral disturbance: Secondary | ICD-10-CM

## 2014-11-14 DIAGNOSIS — F03918 Unspecified dementia, unspecified severity, with other behavioral disturbance: Secondary | ICD-10-CM

## 2014-11-14 DIAGNOSIS — G301 Alzheimer's disease with late onset: Secondary | ICD-10-CM

## 2014-11-14 NOTE — Telephone Encounter (Signed)
Alexa Hardin I need [facility] to provide home services for this patient with advanced dementia. Can you contact them please? thanks

## 2014-11-15 NOTE — Telephone Encounter (Signed)
Sent message through EPIC to Miguel Dibble, RN at [facility] to let her know referral was placed.

## 2014-11-15 NOTE — Telephone Encounter (Signed)
Received message from Miguel Dibble, RN from [facility] care that they received referral message and are working on it.

## 2014-11-16 ENCOUNTER — Encounter (HOSPITAL_COMMUNITY)
Admission: RE | Admit: 2014-11-16 | Discharge: 2014-11-16 | Disposition: A | Discharge status: Home / Self Care | Payer: Medicare HMO | Source: Ambulatory Visit | Attending: Hematology | Admitting: Hematology

## 2014-11-16 DIAGNOSIS — C799 Secondary malignant neoplasm of unspecified site: Secondary | ICD-10-CM

## 2014-11-16 DIAGNOSIS — C439 Malignant melanoma of skin, unspecified: Secondary | ICD-10-CM

## 2014-11-16 LAB — GLUCOSE, CAPILLARY: Glucose-Capillary: 80 mg/dL (ref 65–99)

## 2014-11-16 MED ORDER — FLUDEOXYGLUCOSE F - 18 (FDG) INJECTION
6.4000 | Freq: Once | INTRAVENOUS | Status: DC | PRN
  Administered 2014-11-16: 6.4 via INTRAVENOUS
  Filled 2014-11-16: qty 6.4

## 2014-11-22 ENCOUNTER — Telehealth: Payer: Self-pay | Admitting: *Deleted

## 2014-11-22 NOTE — Telephone Encounter (Signed)
Request fax to [facility] requesting records.

## 2014-11-26 ENCOUNTER — Other Ambulatory Visit: Payer: Self-pay | Admitting: Neurology

## 2014-11-26 ENCOUNTER — Encounter: Payer: Self-pay | Admitting: Hematology

## 2014-11-26 ENCOUNTER — Ambulatory Visit (HOSPITAL_BASED_OUTPATIENT_CLINIC_OR_DEPARTMENT_OTHER): Payer: Medicare HMO | Admitting: Hematology

## 2014-11-26 ENCOUNTER — Ambulatory Visit: Payer: Medicare HMO

## 2014-11-26 ENCOUNTER — Ambulatory Visit (HOSPITAL_BASED_OUTPATIENT_CLINIC_OR_DEPARTMENT_OTHER): Payer: Medicare HMO

## 2014-11-26 ENCOUNTER — Other Ambulatory Visit (HOSPITAL_BASED_OUTPATIENT_CLINIC_OR_DEPARTMENT_OTHER): Payer: Medicare HMO | Admitting: Lab

## 2014-11-26 ENCOUNTER — Telehealth: Payer: Self-pay | Admitting: Lab

## 2014-11-26 ENCOUNTER — Telehealth: Payer: Self-pay | Admitting: Neurology

## 2014-11-26 VITALS — BP 147/64 | HR 73 | Temp 97.8°F | Resp 19 | Ht 69.0 in | Wt 113.6 lb

## 2014-11-26 DIAGNOSIS — C3490 Malignant neoplasm of unspecified part of unspecified bronchus or lung: Secondary | ICD-10-CM

## 2014-11-26 DIAGNOSIS — D0502 Lobular carcinoma in situ of left breast: Secondary | ICD-10-CM

## 2014-11-26 DIAGNOSIS — C50912 Malignant neoplasm of unspecified site of left female breast: Secondary | ICD-10-CM

## 2014-11-26 DIAGNOSIS — C7800 Secondary malignant neoplasm of unspecified lung: Secondary | ICD-10-CM | POA: Diagnosis not present

## 2014-11-26 DIAGNOSIS — C799 Secondary malignant neoplasm of unspecified site: Secondary | ICD-10-CM

## 2014-11-26 DIAGNOSIS — E039 Hypothyroidism, unspecified: Secondary | ICD-10-CM

## 2014-11-26 DIAGNOSIS — D0501 Lobular carcinoma in situ of right breast: Secondary | ICD-10-CM

## 2014-11-26 DIAGNOSIS — C439 Malignant melanoma of skin, unspecified: Secondary | ICD-10-CM

## 2014-11-26 DIAGNOSIS — Z5112 Encounter for antineoplastic immunotherapy: Secondary | ICD-10-CM

## 2014-11-26 DIAGNOSIS — E538 Deficiency of other specified B group vitamins: Secondary | ICD-10-CM

## 2014-11-26 DIAGNOSIS — C50911 Malignant neoplasm of unspecified site of right female breast: Secondary | ICD-10-CM

## 2014-11-26 DIAGNOSIS — Z79811 Long term (current) use of aromatase inhibitors: Secondary | ICD-10-CM

## 2014-11-26 LAB — COMPREHENSIVE METABOLIC PANEL (CC13)
ALBUMIN: 3.3 g/dL — AB (ref 3.5–5.0)
ALK PHOS: 44 U/L (ref 40–150)
ALT: 18 U/L (ref 0–55)
ANION GAP: 10 meq/L (ref 3–11)
AST: 21 U/L (ref 5–34)
BILIRUBIN TOTAL: 0.56 mg/dL (ref 0.20–1.20)
BUN: 31.8 mg/dL — ABNORMAL HIGH (ref 7.0–26.0)
CALCIUM: 9.2 mg/dL (ref 8.4–10.4)
CO2: 23 mEq/L (ref 22–29)
Chloride: 108 mEq/L (ref 98–109)
Creatinine: 1 mg/dL (ref 0.6–1.1)
EGFR: 52 mL/min/{1.73_m2} — AB (ref >90)
GLUCOSE: 120 mg/dL (ref 70–140)
POTASSIUM: 3.9 meq/L (ref 3.5–5.1)
Sodium: 140 mEq/L (ref 136–145)
TOTAL PROTEIN: 6.5 g/dL (ref 6.4–8.3)

## 2014-11-26 LAB — CBC WITH DIFFERENTIAL/PLATELET
BASO%: 1 % (ref 0.0–2.0)
Basophils Absolute: 0.1 10*3/uL (ref 0.0–0.1)
EOS%: 0.5 % (ref 0.0–7.0)
Eosinophils Absolute: 0.1 10*3/uL (ref 0.0–0.5)
HEMATOCRIT: 36.3 % (ref 34.8–46.6)
HEMOGLOBIN: 12 g/dL (ref 11.6–15.9)
LYMPH#: 1.4 10*3/uL (ref 0.9–3.3)
LYMPH%: 13 % — ABNORMAL LOW (ref 14.0–49.7)
MCH: 27.5 pg (ref 25.1–34.0)
MCHC: 33.2 g/dL (ref 31.5–36.0)
MCV: 83 fL (ref 79.5–101.0)
MONO#: 0.8 10*3/uL (ref 0.1–0.9)
MONO%: 7.7 % (ref 0.0–14.0)
NEUT#: 8.1 10*3/uL — ABNORMAL HIGH (ref 1.5–6.5)
NEUT%: 77.8 % — ABNORMAL HIGH (ref 38.4–76.8)
PLATELETS: 249 10*3/uL (ref 145–400)
RBC: 4.37 10*6/uL (ref 3.70–5.45)
RDW: 15.8 % — AB (ref 11.2–14.5)
WBC: 10.4 10*3/uL — ABNORMAL HIGH (ref 3.9–10.3)

## 2014-11-26 LAB — TSH CHCC: TSH: 4.448 m(IU)/L — ABNORMAL HIGH (ref 0.308–3.960)

## 2014-11-26 MED ORDER — SODIUM CHLORIDE 0.9 % IJ SOLN
10.0000 mL | INTRAMUSCULAR | Status: DC | PRN
  Administered 2014-11-26: 10 mL
  Filled 2014-11-26: qty 10

## 2014-11-26 MED ORDER — HEPARIN SOD (PORK) LOCK FLUSH 100 UNIT/ML IV SOLN
500.0000 [IU] | Freq: Once | INTRAVENOUS | Status: AC | PRN
  Administered 2014-11-26: 500 [IU]
  Filled 2014-11-26: qty 5

## 2014-11-26 MED ORDER — SODIUM CHLORIDE 0.9 % IJ SOLN
10.0000 mL | Freq: Once | INTRAMUSCULAR | Status: AC
  Administered 2014-11-26: 10 mL via INTRAVENOUS
  Filled 2014-11-26: qty 10

## 2014-11-26 MED ORDER — SODIUM CHLORIDE 0.9 % IV SOLN
160.0000 mg | Freq: Once | INTRAVENOUS | Status: AC
  Administered 2014-11-26: 160 mg via INTRAVENOUS
  Filled 2014-11-26: qty 16

## 2014-11-26 MED ORDER — SODIUM CHLORIDE 0.9 % IV SOLN
Freq: Once | INTRAVENOUS | Status: AC
  Administered 2014-11-26: 16:00:00 via INTRAVENOUS

## 2014-11-26 NOTE — Patient Instructions (Signed)

## 2014-11-26 NOTE — Progress Notes (Signed)
[place]  Telephone:(336) [phone] [phone]) [phone]  Clinic follow up Note   Patient Care Team: Alexa Late, MD as PCP - General (Family Medicine) 11/26/2014  CHIEF COMPLAINTS/PURPOSE OF CONSULTATION:  Follow up metastatic melanoma  Oncology History   Metastatic melanoma   Staging form: Melanoma of the Skin, AJCC 7th Edition     Clinical: Stage IV (TX, NX, M1b) - Unsigned          Metastatic melanoma ([place])   11/03/2011 Imaging PET CT scan showed hypermetabolic nodule in the lingular, 3 cm, consistent with known metastatic disease, hypermetabolic focus in the medial right thigh, without visible lesion on CT. Mild activity in anterior lateral chest wall favoring postsurgical    2013 Initial Diagnosis Metastatic melanoma   10/21/2011 Initial Biopsy Left lung mass biopsy, tumor is present suggests melanoma.   12/02/2011 Surgery Left alone mass which resection, metastatic melanoma, parenchymal margin negative. Lingular mass, negative margins. Nordheim staining consistent with melanoma.   01/15/2012 Miscellaneous BRAF and c-kit mutation and lysis were negative   06/29/2012 -  Chemotherapy Yervoy, followed by Beryle Flock, and currently on Opdivo    11/21/2013 Surgery Resection of metastatic melanoma in right lateral back and right upper arm, past was consistent with melanoma. Margins were negative.   05/17/2014 Imaging Brain MRI showed diffuse cerebral atrophy with superimposed small vessel ischemia disease stable from prior exam. No evidence of intracranial metastasis.   05/17/2014 Imaging CT of the chest, abdomen and pelvis showed no findings to suggest metastatic disease. Decreased in size of perifascial fat of the right flank, previous right pelvic node no longer seen.    Relapse/Recurrence     Chemotherapy     Breast cancer, right breast ([place])   09/26/1999 Initial Diagnosis Right breast lumpectomy showed infiltrating lobular carcinoma, grade 2, T2N1, surgical margins  were negative. Laboratory carcinoma in situ.   09/26/1999 Receptors her2 ER 70% positive, PR 10% positive, HER-2 negative.   2001 -  Adjuvant Chemotherapy Adjuvant Adriamycin, Cytoxan and Taxol.   2001 -  Radiation Therapy Adjuvant breast radiation   2002 - 2007 Anti-estrogen oral therapy Tamoxifen for one year, followed by Arimidex which was later on switched to Aromasin due to side effects, for total 5 years.    Breast cancer, left breast ([place])   05/28/2011 Initial Diagnosis Breast cancer, left breast   05/28/2011 Initial Biopsy Left breast at 12:00 position biopsy showed ductal carcinoma in situ high-grade, left breast 3 clock position biopsy showed infiltrating lobular carcinoma.   05/28/2011 Receptors her2 ER positive, PR positive, HER-2 negative. Oncotype recurrence score 13   06/19/2011 Surgery Right breast exertion, no cancer. Sentinel lymph node negative. Left breast exertion showed focal invasive lobular carcinoma 8 mm maximal dimension. Grade 1, DCIS present. Total 41 is negative.   HISTORY OF PRESENTING ILLNESS:  Alexa Hardin 81 y.o. female is here to transfer her oncological care to [place] due to the recent relocation. She is accompanied by her husband and daughter to the clinic today.  Please see the summary for her oncological issue above. In summary, she had a stage II of right breast lobular carcinoma in 2001, and stage I left breast lobular carcinoma in 2013, and currently on adjuvant Femara. She was diagnosed with metastatic melanoma to lung, subcutaneous and the lymph nodes in October 2013, status post surgical resection of the lung metastases, and to subcutaneous lesion. She has been on immunotherapy since then, including YervoyX4, followed by [facility], and currently on [name] Sep 2015.  She has been tolerating treatment very well, with only mild fatigue, no significant other side effects.  She used to live with her husband in [place], was recently moved to [place] nose:  To be with her daughter and her family. Her family members have noticed worsening memory loss, some cognitive dysfunction, and some personality change, such as anger control, in the past several months. One day a few weeks ago, she was found wandering in the parking lot outside of her hotel room at 4:00 in the morning. She needs supervision for her daily activities, such as showering, taking medication. Patient denies any of the above issues, but her conversation was disorganized, and had significant short-term memory issue when I tested her.  She has low to moderate appetite ,  on Megace, eats small portion of female, she lost about 20 lbs in the past two years, No significant weight loss lately. She denies any significant pain, dyspnea, abdominal discomfort, or change of her bowel habits. She is able to ambulatory independently without difficulty. She is not very physically active, but able to tolerate daily routine activity.   CURRENT THERAPY: [name] 16m/kg every 2 weeks, since 09/2013  IAlvordreturns for follow-up. She is accompanied to the clinic by her husband. She is clinically stable, she has mild fatigue for 3-4 days after nivo infusion, and recovers well. She denies any pain, dyspnea or other symptoms. She saw neurologist Dr. AJaynee Eaglesa few weeks ago for her dementia, and started Aricept 5 mg daily. She needs supervision and help for her most ADLs, she works independently, her appetite and energy level are moderate. No weight loss recently.  MEDICAL HISTORY:  Past Medical History  Diagnosis Date  . Hypertension   . [place] ([place]     Breast/melanoma  . Dementia     SURGICAL HISTORY: Past Surgical History  Procedure Laterality Date  . Lung removal, partial Left 2013  . Mastectomy Bilateral 2013  . Abdominal wall mass resection  06/15/2012    SOCIAL HISTORY: Social History   Social History  . Marital Status: Married    Spouse Name: Alexa Hardin . Number of Children: 1    . Years of Education: 16   Occupational History  . 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 Smoking status: Former Smoker -- 1.50 packs/day for 30 years    Quit date: 01/20/1983  . Smokeless tobacco: Not on file  . Alcohol Use: 0.6 oz/week    1 Glasses of wine per week     Comment: every a few days   . Drug Use: No  . Sexual Activity: Not on file   Other Topics Concern  . Not on file   Social History Narrative   Lives at home with husband and daughter/son-in law   Caffeine use: 1 cup per day    FAMILY HISTORY: Family History  Problem Relation Age of Onset  . Cancer Father     lung cancer  . Dementia Neg Hx     ALLERGIES:  is allergic to quinolones.  MEDICATIONS:  Current Outpatient Prescriptions  Medication Sig Dispense Refill  . aspirin 81 MG tablet Take 81 mg by mouth daily.    .Marland Kitchenatenolol (TENORMIN) 50 MG tablet Take 50 mg by mouth daily.    . Cholecalciferol (VITAMIN D3) 1000 UNITS CAPS Take 1 capsule by mouth daily.    .Marland Kitchendonepezil (ARICEPT) 10 MG tablet Take 1 tablet (10 mg total) by  mouth at bedtime. 30 tablet 3  . donepezil (ARICEPT) 5 MG tablet Take 1 tablet (5 mg total) by mouth at bedtime. 30 tablet 0  . letrozole (FEMARA) 2.5 MG tablet Take 1 tablet (2.5 mg total) by mouth daily. 30 tablet 5  . levothyroxine (SYNTHROID, LEVOTHROID) 50 MCG tablet Take 50 mcg by mouth daily before breakfast.    . losartan (COZAAR) 100 MG tablet Take 100 mg by mouth daily.    . megestrol (MEGACE ES) 625 MG/5ML suspension Take 5 mLs (625 mg total) by mouth daily. 150 mL 3  . simvastatin (ZOCOR) 40 MG tablet Take 40 mg by mouth daily.     No current facility-administered medications for this visit.    REVIEW OF SYSTEMS:   Constitutional: Denies fevers, chills or abnormal night sweats Eyes: Denies blurriness of vision, double vision or watery eyes Ears, nose, mouth, throat, and face: Denies mucositis or sore throat Respiratory: Denies cough,  dyspnea or wheezes Cardiovascular: Denies palpitation, chest discomfort or lower extremity swelling Gastrointestinal:  Denies nausea, heartburn or change in bowel habits Skin: Denies abnormal skin rashes Lymphatics: Denies new lymphadenopathy or easy bruising Neurological:Denies numbness, tingling or new weaknesses Behavioral/Psych: Mood is stable, no new changes  All other systems were reviewed with the patient and are negative.  PHYSICAL EXAMINATION: ECOG PERFORMANCE STATUS: 2 - Symptomatic, <50% confined to bed  Filed Vitals:   11/26/14 1451  BP: 147/64  Pulse: 73  Temp: 97.8 F (36.6 C)  Resp: 19   Filed Weights   11/26/14 1451  Weight: 113 lb 9.6 oz (51.529 kg)    GENERAL:alert, no distress and comfortable SKIN: skin color, texture, turgor are normal, no rashes or significant lesions EYES: normal, conjunctiva are pink and non-injected, sclera clear OROPHARYNX:no exudate, no erythema and lips, buccal mucosa, and tongue normal  NECK: supple, thyroid normal size, non-tender, without nodularity LYMPH:  no palpable lymphadenopathy in the cervical, axillary or inguinal LUNGS: clear to auscultation and percussion with normal breathing effort HEART: regular rate & rhythm and no murmurs and no lower extremity edema ABDOMEN:abdomen soft, non-tender and normal bowel sounds Musculoskeletal:no cyanosis of digits and no clubbing  PSYCH: alert & oriented x 3 with fluent speech NEURO: no focal motor/sensory deficits  LABORATORY DATA:  I have reviewed the data as listed  CBC Latest Ref Rng 11/26/2014 11/12/2014 10/29/2014  WBC 3.9 - 10.3 10e3/uL 10.4(H) 9.5 11.3(H)  Hemoglobin 11.6 - 15.9 g/dL 12.0 12.0 12.5  Hematocrit 34.8 - 46.6 % 36.3 36.7 37.8  Platelets 145 - 400 10e3/uL 249 276 329    CMP Latest Ref Rng 11/26/2014 11/12/2014 10/29/2014  Glucose 70 - 140 mg/dl 120 70 89  BUN 7.0 - 26.0 mg/dL 31.8(H) 19.6 20.2  Creatinine 0.6 - 1.1 mg/dL 1.0 0.8 0.9  Sodium 136 - 145 mEq/L  140 141 142  Potassium 3.5 - 5.1 mEq/L 3.9 3.8 3.9  CO2 22 - 29 mEq/L 23 26 25   Calcium 8.4 - 10.4 mg/dL 9.2 9.2 9.4  Total Protein 6.4 - 8.3 g/dL 6.5 6.5 7.0  Total Bilirubin 0.20 - 1.20 mg/dL 0.56 0.93 0.73  Alkaline Phos 40 - 150 U/L 44 38(L) 55  AST 5 - 34 U/L 21 18 21   ALT 0 - 55 U/L 18 11 12       RADIOGRAPHIC STUDIES: I have personally reviewed the radiological images as listed and agreed with the findings in the report. Nm Pet Image Restage (ps) Whole Body  11/16/2014  CLINICAL DATA:  Subsequent  treatment strategy for melanoma. Patient diagnosed with metastatic melanoma to lung, subcutaneous tissues, and lymph nodes in October 2013, with surgical resection of lung metastatic lesions and subcutaneous lesion. Immunotherapy since that time. History of stage I left breast lobular carcinoma in 2013, stage II right breast lobular carcinoma in 2001. Recent move from [place] to [place]. EXAM: NUCLEAR MEDICINE PET WHOLE BODY TECHNIQUE: 6.4 mCi F-18 FDG was injected intravenously. Full-ring PET imaging was performed from the vertex to the feet after the radiotracer. CT data was obtained and used for attenuation correction and anatomic localization. FASTING BLOOD GLUCOSE:  Value:  80 mg/dl COMPARISON:  Chest radiograph of 09/03/2014 FINDINGS: Head/Neck: Asymmetric white matter hypodensity in the high left parietal lobe accompanied by hypometabolic activity in this vicinity. An obvious mass lesion is not well seen. Very high activity along the vocal cords bilaterally, probably physiologic, maximum standard uptake value 17.3 Physiologic activity in the right trapezius muscle and along some of the right scalene muscles. Chest: No hypermetabolic mediastinal or hilar nodes. No suspicious pulmonary nodules on the CT scan. Right apical airspace opacity has only subtle associated activity, maximum 2.5 SUV. Scarring in the right middle lobe. Postoperative findings in the lingula. Mild cardiomegaly. High  activity in the lower thoracic cord is a common location for artifact, and does not have a CT correlate. Abdomen/Pelvis: Bowel activity appears possibly physiologic. Aortoiliac atherosclerotic vascular disease. Skeleton and Extremities: Physiologic increased activity in the right tibialis anterior muscle. Misregistration of activity in the feet with apparent high activity along the dorsal subcutaneous tissues of the right foot overlying the area of the calcaneocuboid articulation. Correlate with visual inspection of the skin in this region. Right hip prosthesis noted. IMPRESSION: 1. Asymmetric white matter hypodensity in the left parietal lobe accompanied by a hypometabolic activity. This may be from chronic microvascular white matter disease, but probably warrants brain MRI with and without contrast for further workup. 2. Physiologic activity in the glottis and several right-sided muscle groups, without CT findings to suspect metastatic disease. 3. Airspace opacity at the right lung apex, possibly from prior therapy. This only has slightly accentuated metabolic activity in a generalized fashion, and well reasonable to surveil is unlikely to represent active metastatic melanoma. 4. Cutaneous and subcutaneous activity along the dorsum of the right foot, probably incidental, correlate with visual inspection of this region. 5. Sigmoid diverticulosis. 6. Other imaging findings of potential clinical significance: Mild cardiomegaly. Right hip prosthesis. Aortoiliac atherosclerotic vascular disease. Electronically Signed   By: Alexa Hardin M.D.   On: 11/16/2014 16:07    ASSESSMENT & PLAN: 81 year old Caucasian female  1. Metastatic melanoma to lung, abdominal wall, subcutaneous soft tissue and lymph nodes -We reviewed her melanoma is incurable at this stage, and the goal of therapy is disease control and prolong her life  -she has been doing very well on immunotherapy. Her last CT scan in April 2016 showed no  evidence of disease -I recommend to continue Nivo, she is tolerating well  -The potential side effects, especially immune related reactions, was discuss with her family members again, they voiced good understanding -Giving the underlying dementia and advanced age, she would not be a good candidate for chemotherapy if her disease progress -FDA has recently changed the nivolumab dosing in melanoma, from 3 mg/kg to 240 mg flat dose. However, she only weights 50 kg, and I'll will remain her current dose at 3 mg/kg for now.  -Her restaging PET scan was reviewed with patient and her husband. No evidence  of active disease. -Will continue [name] every 2 weeks,   2. History of bilateral breast lobular carcinoma -She is still on adjuvant Femara, we'll continue, a total 5 years -She is status post bilateral mastectomy, no need mammogram -I'll follow-up clinically   3. Dementia -She has never been diagnosed, but her clinical symptoms are suggestive of moderate dementia, her last brain MRI showed brain atrophy and ischemic change -She recently started Aricept, will follow up with her neurologist  4. Hypertension -Continue follow-up with primary care physician Dr. Sandi Mariscal   5. Hypothyroidism  -Her TSH has been normal lately, will repeat every month when on Nivo  -she will follow up with PCP    Plan -continue Nivo every 2 weeks, treatment today -I will see her back in 4 weeks   All questions were answered. The patient knows to call the clinic with any problems, questions or concerns. I spent 20 minutes counseling the patient face to face. The total time spent in the appointment was 30 minutes and more than 50% was on counseling.     Truitt Merle, MD 11/26/2014 8:54 AM

## 2014-11-26 NOTE — Telephone Encounter (Signed)
Faxed lab orders per pt request to cancer center. Received fax confirmation.

## 2014-11-26 NOTE — Patient Instructions (Signed)
[facility] Discharge Instructions for Patients Receiving Chemotherapy  Today you received the following: [name]   To help prevent nausea and vomiting after your treatment, we encourage you to take your nausea medication as directed.   If you develop nausea and vomiting that is not controlled by your nausea medication, call the clinic.   BELOW ARE SYMPTOMS THAT SHOULD BE REPORTED IMMEDIATELY:  *FEVER GREATER THAN 100.5 F  *CHILLS WITH OR WITHOUT FEVER  NAUSEA AND VOMITING THAT IS NOT CONTROLLED WITH YOUR NAUSEA MEDICATION  *UNUSUAL SHORTNESS OF BREATH  *UNUSUAL BRUISING OR BLEEDING  TENDERNESS IN MOUTH AND THROAT WITH OR WITHOUT PRESENCE OF ULCERS  *URINARY PROBLEMS  *BOWEL PROBLEMS  UNUSUAL RASH Items with * indicate a potential emergency and should be followed up as soon as possible.  Feel free to call the clinic you have any questions or concerns. The clinic phone number is [phone].  Please show the CHEMO ALERT CARD at check-in to the Emergency Department and triage nurse.   

## 2014-11-26 NOTE — Telephone Encounter (Signed)
Husband Deidre Ala called, wife is currently at the [facility], states Dr. Jaynee Eagles wanted to have B12 and folate drawn at this visit, there is no order for this at the [facility], please fax order to [phone].

## 2014-12-10 ENCOUNTER — Other Ambulatory Visit: Payer: Self-pay | Admitting: Hematology

## 2014-12-10 ENCOUNTER — Ambulatory Visit (HOSPITAL_BASED_OUTPATIENT_CLINIC_OR_DEPARTMENT_OTHER): Payer: Medicare HMO

## 2014-12-10 ENCOUNTER — Ambulatory Visit: Payer: Medicare HMO

## 2014-12-10 ENCOUNTER — Other Ambulatory Visit (HOSPITAL_BASED_OUTPATIENT_CLINIC_OR_DEPARTMENT_OTHER): Payer: Medicare HMO

## 2014-12-10 VITALS — BP 125/73 | HR 70 | Temp 97.5°F | Resp 20

## 2014-12-10 DIAGNOSIS — C799 Secondary malignant neoplasm of unspecified site: Secondary | ICD-10-CM

## 2014-12-10 DIAGNOSIS — C50912 Malignant neoplasm of unspecified site of left female breast: Secondary | ICD-10-CM | POA: Diagnosis not present

## 2014-12-10 DIAGNOSIS — C3490 Malignant neoplasm of unspecified part of unspecified bronchus or lung: Secondary | ICD-10-CM

## 2014-12-10 DIAGNOSIS — Z5112 Encounter for antineoplastic immunotherapy: Secondary | ICD-10-CM

## 2014-12-10 DIAGNOSIS — C439 Malignant melanoma of skin, unspecified: Secondary | ICD-10-CM

## 2014-12-10 DIAGNOSIS — C50911 Malignant neoplasm of unspecified site of right female breast: Secondary | ICD-10-CM

## 2014-12-10 DIAGNOSIS — Z95828 Presence of other vascular implants and grafts: Secondary | ICD-10-CM

## 2014-12-10 LAB — CBC WITH DIFFERENTIAL/PLATELET
BASO%: 0.5 % (ref 0.0–2.0)
BASOS ABS: 0 10*3/uL (ref 0.0–0.1)
EOS%: 0.5 % (ref 0.0–7.0)
Eosinophils Absolute: 0 10*3/uL (ref 0.0–0.5)
HCT: 36.1 % (ref 34.8–46.6)
HGB: 11.8 g/dL (ref 11.6–15.9)
LYMPH%: 11.2 % — AB (ref 14.0–49.7)
MCH: 27.3 pg (ref 25.1–34.0)
MCHC: 32.8 g/dL (ref 31.5–36.0)
MCV: 83.2 fL (ref 79.5–101.0)
MONO#: 0.9 10*3/uL (ref 0.1–0.9)
MONO%: 9.1 % (ref 0.0–14.0)
NEUT#: 7.4 10*3/uL — ABNORMAL HIGH (ref 1.5–6.5)
NEUT%: 78.7 % — AB (ref 38.4–76.8)
Platelets: 247 10*3/uL (ref 145–400)
RBC: 4.34 10*6/uL (ref 3.70–5.45)
RDW: 15.3 % — ABNORMAL HIGH (ref 11.2–14.5)
WBC: 9.4 10*3/uL (ref 3.9–10.3)
lymph#: 1.1 10*3/uL (ref 0.9–3.3)

## 2014-12-10 LAB — COMPREHENSIVE METABOLIC PANEL (CC13)
ALT: 19 U/L (ref 0–55)
ANION GAP: 9 meq/L (ref 3–11)
AST: 22 U/L (ref 5–34)
Albumin: 3.5 g/dL (ref 3.5–5.0)
Alkaline Phosphatase: 43 U/L (ref 40–150)
BILIRUBIN TOTAL: 0.68 mg/dL (ref 0.20–1.20)
BUN: 24.9 mg/dL (ref 7.0–26.0)
CO2: 23 mEq/L (ref 22–29)
CREATININE: 1 mg/dL (ref 0.6–1.1)
Calcium: 9.2 mg/dL (ref 8.4–10.4)
Chloride: 107 mEq/L (ref 98–109)
EGFR: 54 mL/min/{1.73_m2} — ABNORMAL LOW (ref >90)
GLUCOSE: 102 mg/dL (ref 70–140)
Potassium: 3.9 mEq/L (ref 3.5–5.1)
Sodium: 139 mEq/L (ref 136–145)
TOTAL PROTEIN: 6.8 g/dL (ref 6.4–8.3)

## 2014-12-10 MED ORDER — SODIUM CHLORIDE 0.9 % IJ SOLN
10.0000 mL | INTRAMUSCULAR | Status: DC | PRN
  Administered 2014-12-10: 10 mL
  Filled 2014-12-10: qty 10

## 2014-12-10 MED ORDER — SODIUM CHLORIDE 0.9 % IJ SOLN
10.0000 mL | INTRAMUSCULAR | Status: DC | PRN
  Administered 2014-12-10: 10 mL via INTRAVENOUS
  Filled 2014-12-10: qty 10

## 2014-12-10 MED ORDER — SODIUM CHLORIDE 0.9 % IV SOLN
240.0000 mg | Freq: Once | INTRAVENOUS | Status: AC
  Administered 2014-12-10: 240 mg via INTRAVENOUS
  Filled 2014-12-10: qty 4

## 2014-12-10 MED ORDER — SODIUM CHLORIDE 0.9 % IV SOLN
Freq: Once | INTRAVENOUS | Status: AC
  Administered 2014-12-10: 14:00:00 via INTRAVENOUS

## 2014-12-10 MED ORDER — HEPARIN SOD (PORK) LOCK FLUSH 100 UNIT/ML IV SOLN
500.0000 [IU] | Freq: Once | INTRAVENOUS | Status: AC | PRN
  Administered 2014-12-10: 500 [IU]
  Filled 2014-12-10: qty 5

## 2014-12-10 NOTE — Patient Instructions (Signed)

## 2014-12-10 NOTE — Patient Instructions (Signed)
[facility] Discharge Instructions for Patients Receiving Chemotherapy  Today you received the following chemotherapy agents [name]  To help prevent nausea and vomiting after your treatment, we encourage you to take your nausea medication as needed   If you develop nausea and vomiting that is not controlled by your nausea medication, call the clinic.   BELOW ARE SYMPTOMS THAT SHOULD BE REPORTED IMMEDIATELY:  *FEVER GREATER THAN 100.5 F  *CHILLS WITH OR WITHOUT FEVER  NAUSEA AND VOMITING THAT IS NOT CONTROLLED WITH YOUR NAUSEA MEDICATION  *UNUSUAL SHORTNESS OF BREATH  *UNUSUAL BRUISING OR BLEEDING  TENDERNESS IN MOUTH AND THROAT WITH OR WITHOUT PRESENCE OF ULCERS  *URINARY PROBLEMS  *BOWEL PROBLEMS  UNUSUAL RASH Items with * indicate a potential emergency and should be followed up as soon as possible.  Feel free to call the clinic you have any questions or concerns. The clinic phone number is [phone].  Please show the CHEMO ALERT CARD at check-in to the Emergency Department and triage nurse.   

## 2014-12-12 ENCOUNTER — Telehealth: Payer: Self-pay | Admitting: *Deleted

## 2014-12-12 NOTE — Telephone Encounter (Signed)
Per staff message and POF I have scheduled appts. Advised scheduler of appts. JMW  

## 2014-12-24 ENCOUNTER — Other Ambulatory Visit: Payer: Self-pay | Admitting: *Deleted

## 2014-12-24 ENCOUNTER — Ambulatory Visit: Payer: Medicare HMO

## 2014-12-24 ENCOUNTER — Ambulatory Visit (HOSPITAL_BASED_OUTPATIENT_CLINIC_OR_DEPARTMENT_OTHER): Payer: Medicare HMO | Admitting: Hematology

## 2014-12-24 ENCOUNTER — Other Ambulatory Visit (HOSPITAL_BASED_OUTPATIENT_CLINIC_OR_DEPARTMENT_OTHER): Payer: Medicare HMO

## 2014-12-24 ENCOUNTER — Ambulatory Visit (HOSPITAL_BASED_OUTPATIENT_CLINIC_OR_DEPARTMENT_OTHER): Payer: Medicare HMO

## 2014-12-24 ENCOUNTER — Encounter: Payer: Self-pay | Admitting: Hematology

## 2014-12-24 ENCOUNTER — Telehealth: Payer: Self-pay | Admitting: Hematology

## 2014-12-24 VITALS — BP 134/55 | HR 63 | Temp 97.6°F | Resp 18

## 2014-12-24 VITALS — BP 134/55 | HR 63 | Temp 98.6°F | Resp 18 | Ht 69.0 in | Wt 117.6 lb

## 2014-12-24 DIAGNOSIS — C799 Secondary malignant neoplasm of unspecified site: Secondary | ICD-10-CM

## 2014-12-24 DIAGNOSIS — F0391 Unspecified dementia with behavioral disturbance: Secondary | ICD-10-CM

## 2014-12-24 DIAGNOSIS — C50911 Malignant neoplasm of unspecified site of right female breast: Secondary | ICD-10-CM

## 2014-12-24 DIAGNOSIS — C50912 Malignant neoplasm of unspecified site of left female breast: Secondary | ICD-10-CM

## 2014-12-24 DIAGNOSIS — Z5112 Encounter for antineoplastic immunotherapy: Secondary | ICD-10-CM | POA: Diagnosis not present

## 2014-12-24 DIAGNOSIS — E039 Hypothyroidism, unspecified: Secondary | ICD-10-CM

## 2014-12-24 DIAGNOSIS — C439 Malignant melanoma of skin, unspecified: Secondary | ICD-10-CM

## 2014-12-24 DIAGNOSIS — D0501 Lobular carcinoma in situ of right breast: Secondary | ICD-10-CM

## 2014-12-24 DIAGNOSIS — D0502 Lobular carcinoma in situ of left breast: Secondary | ICD-10-CM

## 2014-12-24 DIAGNOSIS — C3490 Malignant neoplasm of unspecified part of unspecified bronchus or lung: Secondary | ICD-10-CM

## 2014-12-24 DIAGNOSIS — F03918 Unspecified dementia, unspecified severity, with other behavioral disturbance: Secondary | ICD-10-CM

## 2014-12-24 DIAGNOSIS — Z79811 Long term (current) use of aromatase inhibitors: Secondary | ICD-10-CM

## 2014-12-24 LAB — COMPREHENSIVE METABOLIC PANEL
ALBUMIN: 3.3 g/dL — AB (ref 3.5–5.0)
ALK PHOS: 50 U/L (ref 40–150)
ALT: 12 U/L (ref 0–55)
ANION GAP: 12 meq/L — AB (ref 3–11)
AST: 20 U/L (ref 5–34)
BUN: 22.6 mg/dL (ref 7.0–26.0)
CALCIUM: 9.2 mg/dL (ref 8.4–10.4)
CO2: 22 mEq/L (ref 22–29)
CREATININE: 1.2 mg/dL — AB (ref 0.6–1.1)
Chloride: 106 mEq/L (ref 98–109)
EGFR: 45 mL/min/{1.73_m2} — ABNORMAL LOW (ref >90)
Glucose: 147 mg/dl — ABNORMAL HIGH (ref 70–140)
Sodium: 139 mEq/L (ref 136–145)
Total Bilirubin: 0.62 mg/dL (ref 0.20–1.20)
Total Protein: 6.8 g/dL (ref 6.4–8.3)

## 2014-12-24 LAB — CBC WITH DIFFERENTIAL/PLATELET
BASO%: 0.7 % (ref 0.0–2.0)
Basophils Absolute: 0.1 10*3/uL (ref 0.0–0.1)
EOS%: 1 % (ref 0.0–7.0)
Eosinophils Absolute: 0.1 10*3/uL (ref 0.0–0.5)
HEMATOCRIT: 34.4 % — AB (ref 34.8–46.6)
HGB: 11.4 g/dL — ABNORMAL LOW (ref 11.6–15.9)
LYMPH%: 12.7 % — AB (ref 14.0–49.7)
MCH: 27.3 pg (ref 25.1–34.0)
MCHC: 33 g/dL (ref 31.5–36.0)
MCV: 82.5 fL (ref 79.5–101.0)
MONO#: 0.8 10*3/uL (ref 0.1–0.9)
MONO%: 8.6 % (ref 0.0–14.0)
NEUT#: 7.1 10*3/uL — ABNORMAL HIGH (ref 1.5–6.5)
NEUT%: 77 % — AB (ref 38.4–76.8)
PLATELETS: 256 10*3/uL (ref 145–400)
RBC: 4.17 10*6/uL (ref 3.70–5.45)
RDW: 15.7 % — ABNORMAL HIGH (ref 11.2–14.5)
WBC: 9.2 10*3/uL (ref 3.9–10.3)
lymph#: 1.2 10*3/uL (ref 0.9–3.3)

## 2014-12-24 LAB — TSH: TSH: 3.934 m(IU)/L (ref 0.308–3.960)

## 2014-12-24 MED ORDER — SODIUM CHLORIDE 0.9 % IV SOLN
Freq: Once | INTRAVENOUS | Status: AC
  Administered 2014-12-24: 15:00:00 via INTRAVENOUS

## 2014-12-24 MED ORDER — POTASSIUM CHLORIDE CRYS ER 20 MEQ PO TBCR: EXTENDED_RELEASE_TABLET | ORAL | Status: DC

## 2014-12-24 MED ORDER — SODIUM CHLORIDE 0.9 % IV SOLN
240.0000 mg | Freq: Once | INTRAVENOUS | Status: AC
  Administered 2014-12-24: 240 mg via INTRAVENOUS
  Filled 2014-12-24: qty 20

## 2014-12-24 MED ORDER — HEPARIN SOD (PORK) LOCK FLUSH 100 UNIT/ML IV SOLN
500.0000 [IU] | Freq: Once | INTRAVENOUS | Status: AC | PRN
  Administered 2014-12-24: 500 [IU]
  Filled 2014-12-24: qty 5

## 2014-12-24 MED ORDER — SODIUM CHLORIDE 0.9 % IJ SOLN
10.0000 mL | Freq: Once | INTRAMUSCULAR | Status: AC
  Administered 2014-12-24: 10 mL
  Filled 2014-12-24: qty 10

## 2014-12-24 MED ORDER — SODIUM CHLORIDE 0.9 % IJ SOLN
10.0000 mL | INTRAMUSCULAR | Status: DC | PRN
  Administered 2014-12-24: 10 mL
  Filled 2014-12-24: qty 10

## 2014-12-24 MED ORDER — POTASSIUM CHLORIDE 20 MEQ PO PACK: PACK | ORAL | Status: DC

## 2014-12-24 NOTE — Progress Notes (Signed)
[facility]  [phone] Fax:([phone]  Clinic follow up Note   Patient Care Team: Derinda Late, MD as PCP - General (Family Medicine) 12/24/2014  CHIEF COMPLAINTS:  Follow up metastatic melanoma  Oncology History   Metastatic melanoma   Staging form: Melanoma of the Skin, AJCC 7th Edition     Clinical: Stage IV ([place], NX, M1b) - Unsigned          Metastatic melanoma ([place])   11/03/2011 Imaging PET CT scan showed hypermetabolic nodule in the lingular, 3 cm, consistent with known metastatic disease, hypermetabolic focus in the medial right thigh, without visible lesion on CT. Mild activity in anterior lateral chest wall favoring postsurgical    2013 Initial Diagnosis Metastatic melanoma   10/21/2011 Initial Biopsy Left lung mass biopsy, tumor is present suggests melanoma.   12/02/2011 Surgery Left alone mass which resection, metastatic melanoma, parenchymal margin negative. Lingular mass, negative margins. Wallis staining consistent with melanoma.   01/15/2012 Miscellaneous BRAF and c-kit mutation and lysis were negative   06/29/2012 -  Chemotherapy Yervoy, followed by Beryle Flock, and currently on Opdivo    11/21/2013 Surgery Resection of metastatic melanoma in right lateral back and right upper arm, past was consistent with melanoma. Margins were negative.   05/17/2014 Imaging Brain MRI showed diffuse cerebral atrophy with superimposed small vessel ischemia disease stable from prior exam. No evidence of intracranial metastasis.   05/17/2014 Imaging CT of the chest, abdomen and pelvis showed no findings to suggest metastatic disease. Decreased in size of perifascial fat of the right flank, previous right pelvic node no longer seen.    Relapse/Recurrence     Chemotherapy     Breast cancer, right breast ([place])   09/26/1999 Initial Diagnosis Right breast lumpectomy showed infiltrating lobular carcinoma, grade 2, T2N1, surgical margins were negative.  Laboratory carcinoma in situ.   09/26/1999 Receptors her2 ER 70% positive, PR 10% positive, HER-2 negative.   2001 -  Adjuvant Chemotherapy Adjuvant Adriamycin, Cytoxan and Taxol.   2001 -  Radiation Therapy Adjuvant breast radiation   2002 - 2007 Anti-estrogen oral therapy Tamoxifen for one year, followed by Arimidex which was later on switched to Aromasin due to side effects, for total 5 years.    Breast cancer, left breast ([place])   05/28/2011 Initial Diagnosis Breast cancer, left breast   05/28/2011 Initial Biopsy Left breast at 12:00 position biopsy showed ductal carcinoma in situ high-grade, left breast 3 clock position biopsy showed infiltrating lobular carcinoma.   05/28/2011 Receptors her2 ER positive, PR positive, HER-2 negative. Oncotype recurrence score 13   06/19/2011 Surgery Right breast exertion, no cancer. Sentinel lymph node negative. Left breast exertion showed focal invasive lobular carcinoma 8 mm maximal dimension. Grade 1, DCIS present. Total 41 is negative.   HISTORY OF PRESENTING ILLNESS:  Alexa Hardin 81 y.o. female is here to transfer her oncological care to [place] due to the recent relocation. She is accompanied by her husband and daughter to the clinic today.  Please see the summary for her oncological issue above. In summary, she had a stage II of right breast lobular carcinoma in 2001, and stage I left breast lobular carcinoma in 2013, and currently on adjuvant Femara. She was diagnosed with metastatic melanoma to lung, subcutaneous and the lymph nodes in October 2013, status post surgical resection of the lung metastases, and to subcutaneous lesion. She has been on immunotherapy since then, including [id number], followed by [facility], and currently on [name] Sep 2015. She has  been tolerating treatment very well, with only mild fatigue, no significant other side effects.  She used to live with her husband in [place], was recently moved to [place]: To be with her  daughter and her family. Her family members have noticed worsening memory loss, some cognitive dysfunction, and some personality change, such as anger control, in the past several months. One day a few weeks ago, she was found wandering in the parking lot outside of her hotel room at 4:00 in the morning. She needs supervision for her daily activities, such as showering, taking medication. Patient denies any of the above issues, but her conversation was disorganized, and had significant short-term memory issue when I tested her.  She has low to moderate appetite ,  on Megace, eats small portion of female, she lost about 20 lbs in the past two years, No significant weight loss lately. She denies any significant pain, dyspnea, abdominal discomfort, or change of her bowel habits. She is able to ambulatory independently without difficulty. She is not very physically active, but able to tolerate daily routine activity.   CURRENT THERAPY: [name] 6m/kg (changed to 2452mon 12/10/2014) every 2 weeks, [place] 09[place]2013  [place] She is accompanied by her husband. She is doing well overall. Mild fatigue, tolerating routine activity well. She denies any significant pain, nausea, abdominal discomfort or other complaints. She needs close supervision and some assistance for her ADLs, which has not changed much lately. No other new complaints  MEDICAL HISTORY:  Past Medical History  Diagnosis Date  . Hypertension   . Cancer ([place]    Breast/melanoma  . Dementia     SURGICAL HISTORY: Past Surgical History  Procedure Laterality Date  . Lung removal, partial Left 2013  . Mastectomy Bilateral 2013  . Abdominal wall mass resection  06/15/2012    SOCIAL HISTORY: Social History   Social History  . Marital Status: Married    Spouse Name: RaDeidre Ala. Number of Children: 1  . Years of Education: 16   Occupational History  . Retired [employer] years)    Social  History Main Topics  . Smoking status: Former Smoker -- 1.50 packs/day for 30 years    Quit date: 01/20/1983  . Smokeless tobacco: Not on file  . Alcohol Use: 0.6 oz/week    1 Glasses of wine per week     Comment: every a few days   . Drug Use: No  . Sexual Activity: Not on file   Other Topics Concern  . Not on file   Social History Narrative   Lives at home with husband and daughter/son-in law   Caffeine use: 1 cup per day    FAMILY HISTORY: Family History  Problem Relation Age of Onset  . Cancer Father     lung cancer  . Dementia Neg Hx     ALLERGIES:  is allergic to quinolones.  MEDICATIONS:  Current Outpatient Prescriptions  Medication Sig Dispense Refill  . aspirin 81 MG tablet Take 81 mg by mouth daily.    . Marland Kitchentenolol (TENORMIN) 50 MG tablet Take 50 mg by mouth daily.    . Cholecalciferol (VITAMIN D3) 1000 UNITS CAPS Take 1 capsule by mouth daily.    . Marland Kitchenonepezil (ARICEPT) 10 MG tablet Take 1 tablet (10 mg total) by mouth at bedtime. 30 tablet 3  . donepezil (ARICEPT) 5 MG tablet Take 1 tablet (5 mg total) by mouth at bedtime. 30 tablet  0  . letrozole (FEMARA) 2.5 MG tablet Take 1 tablet (2.5 mg total) by mouth daily. 30 tablet 5  . levothyroxine (SYNTHROID, LEVOTHROID) 50 MCG tablet Take 50 mcg by mouth daily before breakfast.    . losartan (COZAAR) 100 MG tablet Take 100 mg by mouth daily.    . megestrol (MEGACE ES) 625 MG/5ML suspension Take 5 mLs (625 mg total) by mouth daily. 150 mL 3  . potassium chloride SA (K-DUR,KLOR-CON) 20 MEQ tablet 20 meq orally daily x 5 days then daily until next visit. 40 tablet 0  . simvastatin (ZOCOR) 40 MG tablet Take 40 mg by mouth daily.     No current facility-administered medications for this visit.   Facility-Administered Medications Ordered in Other Visits  Medication Dose Route Frequency Provider Last Rate Last Dose  . sodium chloride 0.9 % injection 10 mL  10 mL Intracatheter PRN Truitt Merle, MD   10 mL at 12/24/14 1646     REVIEW OF SYSTEMS:   Constitutional: Denies fevers, chills or abnormal night sweats Eyes: Denies blurriness of vision, double vision or watery eyes Ears, nose, mouth, throat, and face: Denies mucositis or sore throat Respiratory: Denies cough, dyspnea or wheezes Cardiovascular: Denies palpitation, chest discomfort or lower extremity swelling Gastrointestinal:  Denies nausea, heartburn or change in bowel habits Skin: Denies abnormal skin rashes Lymphatics: Denies new lymphadenopathy or easy bruising Neurological:Denies numbness, tingling or new weaknesses Behavioral/Psych: Mood is stable, no new changes  All other systems were reviewed with the patient and are negative.  PHYSICAL EXAMINATION: ECOG PERFORMANCE STATUS: 2 - Symptomatic, <50% confined to bed  Filed Vitals:   12/24/14 1439  BP: 134/55  Pulse: 63  Temp: 98.6 F (37 C)  Resp: 18   Filed Weights   12/24/14 1439  Weight: 117 lb 9.6 oz (53.343 kg)    GENERAL:alert, no distress and comfortable SKIN: skin color, texture, turgor are normal, no rashes or significant lesions EYES: normal, conjunctiva are pink and non-injected, sclera clear OROPHARYNX:no exudate, no erythema and lips, buccal mucosa, and tongue normal  NECK: supple, thyroid normal size, non-tender, without nodularity LYMPH:  no palpable lymphadenopathy in the cervical, axillary or inguinal LUNGS: clear to auscultation and percussion with normal breathing effort HEART: regular rate & rhythm and no murmurs and no lower extremity edema ABDOMEN:abdomen soft, non-tender and normal bowel sounds Musculoskeletal:no cyanosis of digits and no clubbing  PSYCH: alert & oriented x 3 with fluent speech NEURO: no focal motor/sensory deficits Breasts: s/p bilateral mastectomy. Palpation of the chest wall and axilla revealed no obvious mass that I could appreciate.   LABORATORY DATA:  I have reviewed the data as listed  CBC Latest Ref Rng 12/24/2014 12/10/2014  11/26/2014  WBC 3.9 - 10.3 10e3/uL 9.2 9.4 10.4(H)  Hemoglobin 11.6 - 15.9 g/dL 11.4(L) 11.8 12.0  Hematocrit 34.8 - 46.6 % 34.4(L) 36.1 36.3  Platelets 145 - 400 10e3/uL 256 247 249    CMP Latest Ref Rng 12/24/2014 12/10/2014 11/26/2014  Glucose 70 - 140 mg/dl 147(H) 102 120  BUN 7.0 - 26.0 mg/dL 22.6 24.9 31.8(H)  Creatinine 0.6 - 1.1 mg/dL 1.2(H) 1.0 1.0  Sodium 136 - 145 mEq/L 139 139 140  Potassium 3.5 - 5.1 mEq/L 3.0 Repeated and Verified(LL) 3.9 3.9  CO2 22 - 29 mEq/L _0 Calcium 8.4 - 10.4 mg/dL 9.2 9.2 9.2  Total Protein 6.4 - 8.3 g/dL 6.8 6.8 6.5  Total Bilirubin 0.20 - 1.20 mg/dL 0.62 0.68 0.56  Alkaline  Phos 40 - 150 U/L 50 43 44  AST 5 - 34 U/L _0 ALT 0 - 55 U/L _1 RADIOGRAPHIC STUDIES: I have personally reviewed the radiological images as listed and agreed with the findings in the report.  PET/CT scan on 11/16/2014 IMPRESSION: 1. Asymmetric white matter hypodensity in the left parietal lobe accompanied by a hypometabolic activity. This may be from chronic microvascular white matter disease, but probably warrants brain MRI with and without contrast for further workup. 2. Physiologic activity in the glottis and several right-sided muscle groups, without CT findings to suspect metastatic disease. 3. Airspace opacity at the right lung apex, possibly from prior therapy. This only has slightly accentuated metabolic activity in a generalized fashion, and well reasonable to surveil is unlikely to represent active metastatic melanoma. 4. Cutaneous and subcutaneous activity along the dorsum of the right foot, probably incidental, correlate with visual inspection of this region. 5. Sigmoid diverticulosis. 6. Other imaging findings of potential clinical significance: Mild cardiomegaly. Right hip prosthesis. Aortoiliac atherosclerotic vascular disease.  ASSESSMENT & PLAN: 81 year old Caucasian female  1. Metastatic melanoma to lung, abdominal  wall, subcutaneous soft tissue and lymph nodes -We reviewed her melanoma is incurable at this stage, and the goal of therapy is disease control and prolong her life  -she has been doing very well on immunotherapy. Her last CT scan in April 2016 showed no evidence of disease -I recommend to continue Nivo, she is tolerating well  -The potential side effects, especially immune related reactions, was discuss with her family members again, they voiced good understanding -Giving the underlying dementia and advanced age, she would not be a good candidate for chemotherapy if her disease progress -FDA has recently changed the nivolumab dosing in melanoma, from 3 mg/kg to 240 mg flat dose. And I have changed her dose accordingly. -Her restaging PET scan from 11/15/2014 was previously rewed with patient and her husband. No evidence of active disease. -Will continue [name] every 2 weeks,  repeat PET scan in April 2017 if she continues doing well clinically.  2. History of bilateral breast lobular carcinoma -She is still on adjuvant Femara, we'll continue, a total 5 years -She is status post bilateral mastectomy, no need mammogram -I'll follow-up clinically   3. Dementia -She has never been diagnosed, but her clinical symptoms are suggestive of moderate dementia, her last brain MRI showed brain atrophy and ischemic change -She recently started Aricept, will follow up with her neurologist -Her neurologist want her to have B12 and folate level checked, I have ordered to be done here at her next visit today   4. Hypertension -Continue follow-up with primary care physician Dr. Sandi Mariscal   5. Hypothyroidism  -Her TSH has been normal lately, will repeat every month when on Nivo  -she will follow up with PCP    Plan -continue Nivo every 2 weeks, treatment today -I will see her back in 4 weeks  -Avid on B12 and folate level on her next blood draw, per her neurologist Dr. Jaynee Eagles   All questions were  answered. The patient knows to call the clinic with any problems, questions or concerns. I spent 20 minutes counseling the patient face to face. The total time spent in the appointment was 30 minutes and more than 50% was on counseling.     Truitt Merle, MD 12/24/2014 5:26 PM

## 2014-12-24 NOTE — Telephone Encounter (Signed)
Appointments complete per 12/5 pof. Patient to get avs report and appointments in inf area.

## 2014-12-24 NOTE — Patient Instructions (Signed)
[facility] Discharge Instructions for Patients Receiving Chemotherapy  Today you received the following chemotherapy agents [name]  To help prevent nausea and vomiting after your treatment, we encourage you to take your nausea medication as needed   If you develop nausea and vomiting that is not controlled by your nausea medication, call the clinic.   BELOW ARE SYMPTOMS THAT SHOULD BE REPORTED IMMEDIATELY:  *FEVER GREATER THAN 100.5 F  *CHILLS WITH OR WITHOUT FEVER  NAUSEA AND VOMITING THAT IS NOT CONTROLLED WITH YOUR NAUSEA MEDICATION  *UNUSUAL SHORTNESS OF BREATH  *UNUSUAL BRUISING OR BLEEDING  TENDERNESS IN MOUTH AND THROAT WITH OR WITHOUT PRESENCE OF ULCERS  *URINARY PROBLEMS  *BOWEL PROBLEMS  UNUSUAL RASH Items with * indicate a potential emergency and should be followed up as soon as possible.  Feel free to call the clinic you have any questions or concerns. The clinic phone number is [phone].  Please show the CHEMO ALERT CARD at check-in to the Emergency Department and triage nurse.   

## 2014-12-26 ENCOUNTER — Other Ambulatory Visit: Payer: Self-pay | Admitting: *Deleted

## 2014-12-26 MED ORDER — POTASSIUM CHLORIDE CRYS ER 20 MEQ PO TBCR: EXTENDED_RELEASE_TABLET | ORAL | Status: DC

## 2014-12-27 ENCOUNTER — Encounter: Payer: Self-pay | Admitting: Hematology

## 2015-01-07 ENCOUNTER — Ambulatory Visit (HOSPITAL_BASED_OUTPATIENT_CLINIC_OR_DEPARTMENT_OTHER): Payer: Medicare HMO

## 2015-01-07 ENCOUNTER — Ambulatory Visit: Payer: Medicare HMO

## 2015-01-07 ENCOUNTER — Other Ambulatory Visit (HOSPITAL_BASED_OUTPATIENT_CLINIC_OR_DEPARTMENT_OTHER): Payer: Medicare HMO

## 2015-01-07 VITALS — BP 123/52 | HR 64 | Resp 18

## 2015-01-07 DIAGNOSIS — C439 Malignant melanoma of skin, unspecified: Secondary | ICD-10-CM | POA: Diagnosis not present

## 2015-01-07 DIAGNOSIS — C50911 Malignant neoplasm of unspecified site of right female breast: Secondary | ICD-10-CM

## 2015-01-07 DIAGNOSIS — F03918 Unspecified dementia, unspecified severity, with other behavioral disturbance: Secondary | ICD-10-CM

## 2015-01-07 DIAGNOSIS — F0391 Unspecified dementia with behavioral disturbance: Secondary | ICD-10-CM

## 2015-01-07 DIAGNOSIS — Z5112 Encounter for antineoplastic immunotherapy: Secondary | ICD-10-CM

## 2015-01-07 DIAGNOSIS — C799 Secondary malignant neoplasm of unspecified site: Secondary | ICD-10-CM

## 2015-01-07 DIAGNOSIS — Z95828 Presence of other vascular implants and grafts: Secondary | ICD-10-CM

## 2015-01-07 DIAGNOSIS — C50912 Malignant neoplasm of unspecified site of left female breast: Secondary | ICD-10-CM

## 2015-01-07 LAB — COMPREHENSIVE METABOLIC PANEL
ALBUMIN: 3.6 g/dL (ref 3.5–5.0)
ALK PHOS: 49 U/L (ref 40–150)
ALT: 13 U/L (ref 0–55)
AST: 20 U/L (ref 5–34)
Anion Gap: 9 mEq/L (ref 3–11)
BILIRUBIN TOTAL: 1.01 mg/dL (ref 0.20–1.20)
BUN: 26 mg/dL (ref 7.0–26.0)
CALCIUM: 9.3 mg/dL (ref 8.4–10.4)
CO2: 24 mEq/L (ref 22–29)
CREATININE: 1.2 mg/dL — AB (ref 0.6–1.1)
Chloride: 105 mEq/L (ref 98–109)
EGFR: 41 mL/min/{1.73_m2} — ABNORMAL LOW (ref >90)
Glucose: 128 mg/dl (ref 70–140)
POTASSIUM: 4 meq/L (ref 3.5–5.1)
Sodium: 138 mEq/L (ref 136–145)
Total Protein: 7.3 g/dL (ref 6.4–8.3)

## 2015-01-07 LAB — CBC WITH DIFFERENTIAL/PLATELET
BASO%: 1 % (ref 0.0–2.0)
Basophils Absolute: 0.1 10*3/uL (ref 0.0–0.1)
EOS ABS: 0.1 10*3/uL (ref 0.0–0.5)
EOS%: 0.8 % (ref 0.0–7.0)
HCT: 39.1 % (ref 34.8–46.6)
HGB: 12.7 g/dL (ref 11.6–15.9)
LYMPH%: 14.3 % (ref 14.0–49.7)
MCH: 26.7 pg (ref 25.1–34.0)
MCHC: 32.5 g/dL (ref 31.5–36.0)
MCV: 82 fL (ref 79.5–101.0)
MONO#: 1.2 10*3/uL — AB (ref 0.1–0.9)
MONO%: 11.4 % (ref 0.0–14.0)
NEUT%: 72.5 % (ref 38.4–76.8)
NEUTROS ABS: 7.6 10*3/uL — AB (ref 1.5–6.5)
PLATELETS: 308 10*3/uL (ref 145–400)
RBC: 4.77 10*6/uL (ref 3.70–5.45)
RDW: 15.4 % — ABNORMAL HIGH (ref 11.2–14.5)
WBC: 10.5 10*3/uL — AB (ref 3.9–10.3)
lymph#: 1.5 10*3/uL (ref 0.9–3.3)

## 2015-01-07 MED ORDER — SODIUM CHLORIDE 0.9 % IV SOLN
Freq: Once | INTRAVENOUS | Status: AC
  Administered 2015-01-07: 13:00:00 via INTRAVENOUS

## 2015-01-07 MED ORDER — SODIUM CHLORIDE 0.9 % IJ SOLN
10.0000 mL | INTRAMUSCULAR | Status: DC | PRN
  Administered 2015-01-07: 10 mL via INTRAVENOUS
  Filled 2015-01-07: qty 10

## 2015-01-07 MED ORDER — HEPARIN SOD (PORK) LOCK FLUSH 100 UNIT/ML IV SOLN
500.0000 [IU] | Freq: Once | INTRAVENOUS | Status: AC | PRN
  Administered 2015-01-07: 500 [IU]
  Filled 2015-01-07: qty 5

## 2015-01-07 MED ORDER — SODIUM CHLORIDE 0.9 % IJ SOLN
10.0000 mL | INTRAMUSCULAR | Status: DC | PRN
  Administered 2015-01-07: 10 mL
  Filled 2015-01-07: qty 10

## 2015-01-07 MED ORDER — SODIUM CHLORIDE 0.9 % IV SOLN
240.0000 mg | Freq: Once | INTRAVENOUS | Status: AC
  Administered 2015-01-07: 240 mg via INTRAVENOUS
  Filled 2015-01-07: qty 20

## 2015-01-07 NOTE — Patient Instructions (Signed)
[facility] Discharge Instructions for Patients  Today you received the following: [name]   To help prevent nausea and vomiting after your treatment, we encourage you to take your nausea medication as directed.    If you develop nausea and vomiting that is not controlled by your nausea medication, call the clinic.   BELOW ARE SYMPTOMS THAT SHOULD BE REPORTED IMMEDIATELY:  *FEVER GREATER THAN 100.5 F  *CHILLS WITH OR WITHOUT FEVER  NAUSEA AND VOMITING THAT IS NOT CONTROLLED WITH YOUR NAUSEA MEDICATION  *UNUSUAL SHORTNESS OF BREATH  *UNUSUAL BRUISING OR BLEEDING  TENDERNESS IN MOUTH AND THROAT WITH OR WITHOUT PRESENCE OF ULCERS  *URINARY PROBLEMS  *BOWEL PROBLEMS  UNUSUAL RASH Items with * indicate a potential emergency and should be followed up as soon as possible.  Feel free to call the clinic you have any questions or concerns. The clinic phone number is [phone].  Please show the CHEMO ALERT CARD at check-in to the Emergency Department and triage nurse.   

## 2015-01-07 NOTE — Patient Instructions (Signed)

## 2015-01-10 LAB — VITAMIN B12: Vitamin B-12: 355 pg/mL (ref 211–911)

## 2015-01-10 LAB — METHYLMALONIC ACID, SERUM: Methylmalonic Acid, Quant: 194 nmol/L (ref 87–318)

## 2015-01-10 LAB — FOLATE: Folate: 12.2 ng/mL

## 2015-01-21 ENCOUNTER — Encounter: Payer: Self-pay | Admitting: Hematology

## 2015-01-21 NOTE — Progress Notes (Signed)
[place]  [phone] Fax:([phone]) [phone]  Clinic follow up Note   Patient Care Team: Derinda Late, MD as PCP - General (Family Medicine) 01/22/2015  CHIEF COMPLAINTS:  Follow up metastatic melanoma  Oncology History   Metastatic melanoma   Staging form: Melanoma of the Skin, AJCC 7th Edition     Clinical: Stage IV ([place], NX, M1b) - Unsigned          Malignant melanoma of unknown origin ([place])   11/03/2011 Imaging PET CT scan showed hypermetabolic nodule in the lingular, 3 cm, consistent with known metastatic disease, hypermetabolic focus in the medial right thigh, without visible lesion on CT. Mild activity in anterior lateral chest wall favoring postsurgical    2013 Initial Diagnosis Metastatic melanoma   10/21/2011 Initial Biopsy Left lung mass biopsy, tumor is present suggests melanoma.   12/02/2011 Surgery Left alone mass which resection, metastatic melanoma, parenchymal margin negative. Lingular mass, negative margins. Crescent staining consistent with melanoma.   01/15/2012 Miscellaneous BRAF and c-kit mutation and lysis were negative   06/29/2012 -  Chemotherapy [place], followed by Beryle Flock, and currently on Opdivo    11/21/2013 Surgery Resection of metastatic melanoma in right lateral back and right upper arm, past was consistent with melanoma. Margins were negative.   05/17/2014 Imaging Brain MRI showed diffuse cerebral atrophy with superimposed small vessel ischemia disease stable from prior exam. No evidence of intracranial metastasis.   05/17/2014 Imaging CT of the chest, abdomen and pelvis showed no findings to suggest metastatic disease. Decreased in size of perifascial fat of the right flank, previous right pelvic node no longer seen.    Relapse/Recurrence     Chemotherapy     Breast cancer, right breast ([place])   09/26/1999 Initial Diagnosis Right breast lumpectomy showed infiltrating lobular carcinoma, grade 2, T2N1, surgical margins were  negative. Laboratory carcinoma in situ.   09/26/1999 Receptors her2 ER 70% positive, PR 10% positive, HER-2 negative.   2001 -  Adjuvant Chemotherapy Adjuvant Adriamycin, Cytoxan and Taxol.   2001 -  Radiation Therapy Adjuvant breast radiation   2002 - 2007 Anti-estrogen oral therapy Tamoxifen for one year, followed by Arimidex which was later on switched to Aromasin due to side effects, for total 5 years.    Breast cancer, left breast ([place])   05/28/2011 Initial Diagnosis Breast cancer, left breast   05/28/2011 Initial Biopsy Left breast at 12:00 position biopsy showed ductal carcinoma in situ high-grade, left breast 3 clock position biopsy showed infiltrating lobular carcinoma.   05/28/2011 Receptors her2 ER positive, PR positive, HER-2 negative. Oncotype recurrence score 13   06/19/2011 Surgery Right breast exertion, no cancer. Sentinel lymph node negative. Left breast exertion showed focal invasive lobular carcinoma 8 mm maximal dimension. Grade 1, DCIS present. Total 41 is negative.   HISTORY OF PRESENTING ILLNESS:  Alexa Hardin 82 y.o. female is here to transfer her oncological care to [place] due to the recent relocation. She is accompanied by her husband and daughter to the clinic today.  Please see the summary for her oncological issue above. In summary, she had a stage II of right breast lobular carcinoma in 2001, and stage I left breast lobular carcinoma in 2013, and currently on adjuvant Femara. She was diagnosed with metastatic melanoma to lung, subcutaneous and the lymph nodes in October 2013, status post surgical resection of the lung metastases, and to subcutaneous lesion. She has been on immunotherapy since then, including [id number], followed by [facility], and currently on [name] Sep  2015. She has been tolerating treatment very well, with only mild fatigue, no significant other side effects.  She used to live with her husband in [place], was recently moved to [place] nose: To be  with her daughter and her family. Her family members have noticed worsening memory loss, some cognitive dysfunction, and some personality change, such as anger control, in the past several months. One day a few weeks ago, she was found wandering in the parking lot outside of her hotel room at 4:00 in the morning. She needs supervision for her daily activities, such as showering, taking medication. Patient denies any of the above issues, but her conversation was disorganized, and had significant short-term memory issue when I tested her.  She has low to moderate appetite ,  on Megace, eats small portion of female, she lost about 20 lbs in the past two years, No significant weight loss lately. She denies any significant pain, dyspnea, abdominal discomfort, or change of her bowel habits. She is able to ambulatory independently without difficulty. She is not very physically active, but able to tolerate daily routine activity.   CURRENT THERAPY: [name] 71m/kg (changed to 2464mon 12/10/2014) every 2 weeks, since 09/2013  [place] She is doing well overall. She denies any pain, dyspnea, abdominal discomfort. She has good appetite and eats well, she is on megace.  Her energy level is also decent. No significant change recently. Her main issues do the short-term memory loss,  She needs supervision for some ADLs.  MEDICAL HISTORY:  Past Medical History  Diagnosis Date  . Hypertension   . [place]    Breast/melanoma  . Dementia     SURGICAL HISTORY: Past Surgical History  Procedure Laterality Date  . Lung removal, partial Left 2013  . Mastectomy Bilateral 2013  . Abdominal wall mass resection  06/15/2012    SOCIAL HISTORY: Social History   Social History  . Marital Status: Married    Spouse Name: RaDeidre Ala. Number of Children: 1  . Years of Education: 16   Occupational History  . Retired [employer] years)    Social History Main Topics    . Smoking status: Former Smoker -- 1.50 packs/day for 30 years    Quit date: 01/20/1983  . Smokeless tobacco: Not on file  . Alcohol Use: 0.6 oz/week    1 Glasses of wine per week     Comment: every a few days   . Drug Use: No  . Sexual Activity: Not on file   Other Topics Concern  . Not on file   Social History Narrative   Lives at home with husband and daughter/son-in law   Caffeine use: 1 cup per day    FAMILY HISTORY: Family History  Problem Relation Age of Onset  . Cancer Father     lung cancer  . Dementia Neg Hx     ALLERGIES:  is allergic to quinolones.  MEDICATIONS:  Current Outpatient Prescriptions  Medication Sig Dispense Refill  . aspirin 81 MG tablet Take 81 mg by mouth daily.    . Marland Kitchentenolol (TENORMIN) 50 MG tablet Take 50 mg by mouth daily.    . Cholecalciferol (VITAMIN D3) 1000 UNITS CAPS Take 1 capsule by mouth daily.    . Marland Kitchenonepezil (ARICEPT) 10 MG tablet Take 1 tablet (10 mg total) by mouth at bedtime. 30 tablet 3  . letrozole (FEMARA) 2.5 MG tablet Take 1 tablet (2.5 mg total) by mouth  daily. 30 tablet 5  . levothyroxine (SYNTHROID, LEVOTHROID) 50 MCG tablet Take 50 mcg by mouth daily before breakfast.    . losartan (COZAAR) 100 MG tablet Take 100 mg by mouth daily.    . megestrol (MEGACE ES) 625 MG/5ML suspension Take 5 mLs (625 mg total) by mouth daily. 150 mL 3  . NIFEDICAL XL 30 MG 24 hr tablet TK 1 T PO D  1  . potassium chloride SA (K-DUR,KLOR-CON) 20 MEQ tablet 20 meq orally bid x 5 days then daily until next visit. 40 tablet 0  . simvastatin (ZOCOR) 40 MG tablet Take 40 mg by mouth daily.     No current facility-administered medications for this visit.   Facility-Administered Medications Ordered in Other Visits  Medication Dose Route Frequency Provider Last Rate Last Dose  . sodium chloride 0.9 % injection 10 mL  10 mL Intravenous PRN Truitt Merle, MD   10 mL at 01/22/15 1308    REVIEW OF SYSTEMS:   Constitutional: Denies fevers, chills or  abnormal night sweats Eyes: Denies blurriness of vision, double vision or watery eyes Ears, nose, mouth, throat, and face: Denies mucositis or sore throat Respiratory: Denies cough, dyspnea or wheezes Cardiovascular: Denies palpitation, chest discomfort or lower extremity swelling Gastrointestinal:  Denies nausea, heartburn or change in bowel habits Skin: Denies abnormal skin rashes Lymphatics: Denies new lymphadenopathy or easy bruising Neurological:Denies numbness, tingling or new weaknesses Behavioral/Psych: Mood is stable, no new changes  All other systems were reviewed with the patient and are negative.  PHYSICAL EXAMINATION: ECOG PERFORMANCE STATUS: 2 - Symptomatic, <50% confined to bed  Filed Vitals:   01/22/15 1358  BP: 121/59  Pulse: 61  Resp: 18   Filed Weights   01/22/15 1358  Weight: 120 lb 4.8 oz (54.568 kg)    GENERAL:alert, no distress and comfortable SKIN: skin color, texture, turgor are normal, no rashes or significant lesions EYES: normal, conjunctiva are pink and non-injected, sclera clear OROPHARYNX:no exudate, no erythema and lips, buccal mucosa, and tongue normal  NECK: supple, thyroid normal size, non-tender, without nodularity LYMPH:  no palpable lymphadenopathy in the cervical, axillary or inguinal LUNGS: clear to auscultation and percussion with normal breathing effort HEART: regular rate & rhythm and no murmurs and no lower extremity edema ABDOMEN:abdomen soft, non-tender and normal bowel sounds Musculoskeletal:no cyanosis of digits and no clubbing  PSYCH: alert & oriented x 3 with fluent speech NEURO: no focal motor/sensory deficits Breasts: s/p bilateral mastectomy. Palpation of the chest wall and axilla revealed no obvious mass that I could appreciate.   LABORATORY DATA:  I have reviewed the data as listed  CBC Latest Ref Rng 01/22/2015 01/07/2015 12/24/2014  WBC 3.9 - 10.3 10e3/uL 9.4 10.5(H) 9.2  Hemoglobin 11.6 - 15.9 g/dL 12.0 12.7 11.4(L)    Hematocrit 34.8 - 46.6 % 37.6 39.1 34.4(L)  Platelets 145 - 400 10e3/uL 257 308 256    CMP Latest Ref Rng 01/22/2015 01/07/2015 12/24/2014  Glucose 70 - 140 mg/dl 75 128 147(H)  BUN 7.0 - 26.0 mg/dL 26.3(H) 26.0 22.6  Creatinine 0.6 - 1.1 mg/dL 1.1 1.2(H) 1.2(H)  Sodium 136 - 145 mEq/L 140 138 139  Potassium 3.5 - 5.1 mEq/L 4.0 4.0 3.0 Repeated and Verified(LL)  CO2 22 - 29 mEq/L 26 24 22   Calcium 8.4 - 10.4 mg/dL 9.3 9.3 9.2  Total Protein 6.4 - 8.3 g/dL 6.9 7.3 6.8  Total Bilirubin 0.20 - 1.20 mg/dL 0.66 1.01 0.62  Alkaline Phos 40 - 150 U/L  40 49 50  AST 5 - 34 U/L 19 20 20   ALT 0 - 55 U/L 13 13 12       RADIOGRAPHIC STUDIES: I have personally reviewed the radiological images as listed and agreed with the findings in the report.  PET/CT scan on 11/16/2014 IMPRESSION: 1. Asymmetric white matter hypodensity in the left parietal lobe accompanied by a hypometabolic activity. This may be from chronic microvascular white matter disease, but probably warrants brain MRI with and without contrast for further workup. 2. Physiologic activity in the glottis and several right-sided muscle groups, without CT findings to suspect metastatic disease. 3. Airspace opacity at the right lung apex, possibly from prior therapy. This only has slightly accentuated metabolic activity in a generalized fashion, and well reasonable to surveil is unlikely to represent active metastatic melanoma. 4. Cutaneous and subcutaneous activity along the dorsum of the right foot, probably incidental, correlate with visual inspection of this region. 5. Sigmoid diverticulosis. 6. Other imaging findings of potential clinical significance: Mild cardiomegaly. Right hip prosthesis. Aortoiliac atherosclerotic vascular disease.  ASSESSMENT & PLAN: 83 year old Caucasian female  1. Metastatic melanoma to lung, abdominal wall, subcutaneous soft tissue and lymph nodes -We reviewed her melanoma is incurable at this  stage, and the goal of therapy is disease control and prolong her life  -she has been doing very well on immunotherapy. Her last CT scan in April 2016 showed no evidence of disease -I recommend to continue Nivo, she is tolerating well  -Giving the underlying dementia and advanced age, she would not be a good candidate for chemotherapy if her disease progress -Her restaging PET scan from 11/15/2014 was previously rewed with patient and her husband. No evidence of active disease. - lab results reviewed with patient and her husband. -Will continue [name] every 2 weeks,  repeat PET scan in April 2017 if she continues doing well clinically.  2. History of bilateral breast lobular carcinoma -She is still on adjuvant Femara, we'll continue, a total 5 years -She is status post bilateral mastectomy, no need mammogram -I'll follow-up clinically - The bone density scan in July 2017   3. Dementia -She has never been diagnosed, but her clinical symptoms are suggestive of moderate dementia, her last brain MRI showed brain atrophy and ischemic change -She recently started Aricept, will follow up with her neurologist -her B12 and folate level are normal   4. Hypertension -Continue follow-up with primary care physician Dr. Sandi Mariscal   5. Hypothyroidism  -Her TSH has been normal lately, will repeat every month when on Nivo  -she will follow up with PCP    Plan - lab reviewed, adequate for treatment, continue Nivo every 2 weeks, treatment today -I will see her back in 4 weeks   All questions were answered. The patient knows to call the clinic with any problems, questions or concerns. I spent 15 minutes counseling the patient face to face. The total time spent in the appointment was 20 minutes and more than 50% was on counseling.     Truitt Merle, MD 01/22/2015 2:22 PM

## 2015-01-22 ENCOUNTER — Ambulatory Visit (HOSPITAL_BASED_OUTPATIENT_CLINIC_OR_DEPARTMENT_OTHER): Payer: Medicare HMO

## 2015-01-22 ENCOUNTER — Other Ambulatory Visit (HOSPITAL_BASED_OUTPATIENT_CLINIC_OR_DEPARTMENT_OTHER): Payer: Medicare HMO

## 2015-01-22 ENCOUNTER — Telehealth: Payer: Self-pay | Admitting: Hematology

## 2015-01-22 ENCOUNTER — Ambulatory Visit (HOSPITAL_BASED_OUTPATIENT_CLINIC_OR_DEPARTMENT_OTHER): Payer: Medicare HMO | Admitting: Hematology

## 2015-01-22 ENCOUNTER — Ambulatory Visit: Payer: Medicare HMO

## 2015-01-22 ENCOUNTER — Telehealth: Payer: Self-pay | Admitting: *Deleted

## 2015-01-22 VITALS — BP 121/59 | HR 61 | Resp 18 | Ht 69.0 in | Wt 120.3 lb

## 2015-01-22 DIAGNOSIS — E039 Hypothyroidism, unspecified: Secondary | ICD-10-CM | POA: Diagnosis not present

## 2015-01-22 DIAGNOSIS — C439 Malignant melanoma of skin, unspecified: Secondary | ICD-10-CM

## 2015-01-22 DIAGNOSIS — D0501 Lobular carcinoma in situ of right breast: Secondary | ICD-10-CM

## 2015-01-22 DIAGNOSIS — C3490 Malignant neoplasm of unspecified part of unspecified bronchus or lung: Secondary | ICD-10-CM

## 2015-01-22 DIAGNOSIS — Z95828 Presence of other vascular implants and grafts: Secondary | ICD-10-CM

## 2015-01-22 DIAGNOSIS — C50911 Malignant neoplasm of unspecified site of right female breast: Secondary | ICD-10-CM

## 2015-01-22 DIAGNOSIS — Z5112 Encounter for antineoplastic immunotherapy: Secondary | ICD-10-CM | POA: Diagnosis not present

## 2015-01-22 DIAGNOSIS — C50912 Malignant neoplasm of unspecified site of left female breast: Secondary | ICD-10-CM

## 2015-01-22 DIAGNOSIS — F0391 Unspecified dementia with behavioral disturbance: Secondary | ICD-10-CM | POA: Diagnosis not present

## 2015-01-22 DIAGNOSIS — C799 Secondary malignant neoplasm of unspecified site: Secondary | ICD-10-CM

## 2015-01-22 DIAGNOSIS — Z79811 Long term (current) use of aromatase inhibitors: Secondary | ICD-10-CM

## 2015-01-22 DIAGNOSIS — D0502 Lobular carcinoma in situ of left breast: Secondary | ICD-10-CM | POA: Diagnosis not present

## 2015-01-22 LAB — TSH: TSH: 7.235 m(IU)/L — ABNORMAL HIGH (ref 0.308–3.960)

## 2015-01-22 LAB — COMPREHENSIVE METABOLIC PANEL
ALBUMIN: 3.5 g/dL (ref 3.5–5.0)
ALK PHOS: 40 U/L (ref 40–150)
ALT: 13 U/L (ref 0–55)
ANION GAP: 7 meq/L (ref 3–11)
AST: 19 U/L (ref 5–34)
BUN: 26.3 mg/dL — ABNORMAL HIGH (ref 7.0–26.0)
CALCIUM: 9.3 mg/dL (ref 8.4–10.4)
CHLORIDE: 107 meq/L (ref 98–109)
CO2: 26 mEq/L (ref 22–29)
CREATININE: 1.1 mg/dL (ref 0.6–1.1)
EGFR: 46 mL/min/{1.73_m2} — ABNORMAL LOW (ref >90)
Glucose: 75 mg/dl (ref 70–140)
Potassium: 4 mEq/L (ref 3.5–5.1)
Sodium: 140 mEq/L (ref 136–145)
Total Bilirubin: 0.66 mg/dL (ref 0.20–1.20)
Total Protein: 6.9 g/dL (ref 6.4–8.3)

## 2015-01-22 LAB — CBC WITH DIFFERENTIAL/PLATELET
BASO%: 0.8 % (ref 0.0–2.0)
BASOS ABS: 0.1 10*3/uL (ref 0.0–0.1)
EOS ABS: 0.1 10*3/uL (ref 0.0–0.5)
EOS%: 0.6 % (ref 0.0–7.0)
HCT: 37.6 % (ref 34.8–46.6)
HGB: 12 g/dL (ref 11.6–15.9)
LYMPH#: 1.3 10*3/uL (ref 0.9–3.3)
LYMPH%: 14.2 % (ref 14.0–49.7)
MCH: 26.6 pg (ref 25.1–34.0)
MCHC: 32 g/dL (ref 31.5–36.0)
MCV: 83.3 fL (ref 79.5–101.0)
MONO#: 1 10*3/uL — AB (ref 0.1–0.9)
MONO%: 10.2 % (ref 0.0–14.0)
NEUT#: 7 10*3/uL — ABNORMAL HIGH (ref 1.5–6.5)
NEUT%: 74.2 % (ref 38.4–76.8)
PLATELETS: 257 10*3/uL (ref 145–400)
RBC: 4.51 10*6/uL (ref 3.70–5.45)
RDW: 15.9 % — AB (ref 11.2–14.5)
WBC: 9.4 10*3/uL (ref 3.9–10.3)

## 2015-01-22 MED ORDER — SODIUM CHLORIDE 0.9 % IV SOLN
240.0000 mg | Freq: Once | INTRAVENOUS | Status: AC
  Administered 2015-01-22: 240 mg via INTRAVENOUS
  Filled 2015-01-22: qty 24

## 2015-01-22 MED ORDER — SODIUM CHLORIDE 0.9 % IJ SOLN
10.0000 mL | INTRAMUSCULAR | Status: DC | PRN
  Administered 2015-01-22: 10 mL via INTRAVENOUS
  Filled 2015-01-22: qty 10

## 2015-01-22 MED ORDER — HEPARIN SOD (PORK) LOCK FLUSH 100 UNIT/ML IV SOLN
500.0000 [IU] | Freq: Once | INTRAVENOUS | Status: AC | PRN
  Administered 2015-01-22: 500 [IU]
  Filled 2015-01-22: qty 5

## 2015-01-22 MED ORDER — SODIUM CHLORIDE 0.9 % IV SOLN
Freq: Once | INTRAVENOUS | Status: AC
  Administered 2015-01-22: 15:00:00 via INTRAVENOUS

## 2015-01-22 MED ORDER — SODIUM CHLORIDE 0.9 % IJ SOLN
10.0000 mL | INTRAMUSCULAR | Status: DC | PRN
  Administered 2015-01-22: 10 mL
  Filled 2015-01-22: qty 10

## 2015-01-22 NOTE — Patient Instructions (Signed)

## 2015-01-22 NOTE — Telephone Encounter (Signed)
per pof to sch pt appt-gave pt copy of avs-sent MW email to sch trmt-pt aware

## 2015-01-22 NOTE — Telephone Encounter (Signed)
Per staff message and POF I have scheduled appts. Advised scheduler of appts. JMW  

## 2015-01-28 ENCOUNTER — Telehealth: Payer: Self-pay | Admitting: *Deleted

## 2015-01-28 NOTE — Telephone Encounter (Signed)
Spoke with [facility] @ Dr. Collier Salina Blomgren's office ( PCP ) and informed her that TSH results done 01/22/15 will be faxed to her office for Dr. Sandi Mariscal to review. Dr. Deboraha Sprang    Phone     [phone]    ;     Fax       [phone].

## 2015-01-29 ENCOUNTER — Inpatient Hospital Stay (HOSPITAL_BASED_OUTPATIENT_CLINIC_OR_DEPARTMENT_OTHER)
Admission: EM | Admit: 2015-01-29 | Discharge: 2015-02-01 | DRG: 178 | Disposition: A | Discharge status: Home / Self Care | Payer: Medicare HMO | Attending: Internal Medicine | Admitting: Internal Medicine

## 2015-01-29 ENCOUNTER — Emergency Department (HOSPITAL_BASED_OUTPATIENT_CLINIC_OR_DEPARTMENT_OTHER): Payer: Medicare HMO

## 2015-01-29 ENCOUNTER — Encounter (HOSPITAL_BASED_OUTPATIENT_CLINIC_OR_DEPARTMENT_OTHER): Payer: Self-pay

## 2015-01-29 DIAGNOSIS — R058 Other specified cough: Secondary | ICD-10-CM | POA: Diagnosis present

## 2015-01-29 DIAGNOSIS — C4359 Malignant melanoma of other part of trunk: Secondary | ICD-10-CM | POA: Diagnosis present

## 2015-01-29 DIAGNOSIS — E785 Hyperlipidemia, unspecified: Secondary | ICD-10-CM | POA: Diagnosis present

## 2015-01-29 DIAGNOSIS — R059 Cough, unspecified: Secondary | ICD-10-CM

## 2015-01-29 DIAGNOSIS — J690 Pneumonitis due to inhalation of food and vomit: Principal | ICD-10-CM | POA: Diagnosis present

## 2015-01-29 DIAGNOSIS — C439 Malignant melanoma of skin, unspecified: Secondary | ICD-10-CM | POA: Diagnosis present

## 2015-01-29 DIAGNOSIS — G309 Alzheimer's disease, unspecified: Secondary | ICD-10-CM | POA: Diagnosis present

## 2015-01-29 DIAGNOSIS — Z85820 Personal history of malignant melanoma of skin: Secondary | ICD-10-CM

## 2015-01-29 DIAGNOSIS — Z801 Family history of malignant neoplasm of trachea, bronchus and lung: Secondary | ICD-10-CM

## 2015-01-29 DIAGNOSIS — Z9013 Acquired absence of bilateral breasts and nipples: Secondary | ICD-10-CM

## 2015-01-29 DIAGNOSIS — Z79899 Other long term (current) drug therapy: Secondary | ICD-10-CM

## 2015-01-29 DIAGNOSIS — Z853 Personal history of malignant neoplasm of breast: Secondary | ICD-10-CM

## 2015-01-29 DIAGNOSIS — R630 Anorexia: Secondary | ICD-10-CM | POA: Diagnosis present

## 2015-01-29 DIAGNOSIS — F0280 Dementia in other diseases classified elsewhere without behavioral disturbance: Secondary | ICD-10-CM | POA: Diagnosis present

## 2015-01-29 DIAGNOSIS — E860 Dehydration: Secondary | ICD-10-CM | POA: Diagnosis present

## 2015-01-29 DIAGNOSIS — I10 Essential (primary) hypertension: Secondary | ICD-10-CM | POA: Diagnosis present

## 2015-01-29 DIAGNOSIS — R05 Cough: Secondary | ICD-10-CM | POA: Diagnosis not present

## 2015-01-29 DIAGNOSIS — E039 Hypothyroidism, unspecified: Secondary | ICD-10-CM | POA: Diagnosis present

## 2015-01-29 DIAGNOSIS — J209 Acute bronchitis, unspecified: Secondary | ICD-10-CM | POA: Diagnosis present

## 2015-01-29 DIAGNOSIS — Z66 Do not resuscitate: Secondary | ICD-10-CM | POA: Diagnosis present

## 2015-01-29 DIAGNOSIS — Z87891 Personal history of nicotine dependence: Secondary | ICD-10-CM

## 2015-01-29 DIAGNOSIS — Z7982 Long term (current) use of aspirin: Secondary | ICD-10-CM

## 2015-01-29 DIAGNOSIS — Z681 Body mass index (BMI) 19 or less, adult: Secondary | ICD-10-CM

## 2015-01-29 DIAGNOSIS — D649 Anemia, unspecified: Secondary | ICD-10-CM | POA: Diagnosis present

## 2015-01-29 DIAGNOSIS — N179 Acute kidney failure, unspecified: Secondary | ICD-10-CM | POA: Diagnosis present

## 2015-01-29 LAB — COMPREHENSIVE METABOLIC PANEL
ALBUMIN: 3.7 g/dL (ref 3.5–5.0)
ALK PHOS: 55 U/L (ref 38–126)
ALT: 12 U/L — AB (ref 14–54)
AST: 22 U/L (ref 15–41)
Anion gap: 9 (ref 5–15)
BUN: 45 mg/dL — ABNORMAL HIGH (ref 6–20)
CALCIUM: 8.9 mg/dL (ref 8.9–10.3)
CHLORIDE: 105 mmol/L (ref 101–111)
CO2: 22 mmol/L (ref 22–32)
CREATININE: 1.73 mg/dL — AB (ref 0.44–1.00)
GFR calc Af Amer: 31 mL/min — ABNORMAL LOW (ref >60)
GFR calc non Af Amer: 27 mL/min — ABNORMAL LOW (ref >60)
GLUCOSE: 116 mg/dL — AB (ref 65–99)
Potassium: 4 mmol/L (ref 3.5–5.1)
SODIUM: 136 mmol/L (ref 135–145)
Total Bilirubin: 0.6 mg/dL (ref 0.3–1.2)
Total Protein: 7.8 g/dL (ref 6.5–8.1)

## 2015-01-29 LAB — CBC WITH DIFFERENTIAL/PLATELET
BASOS PCT: 0 %
Basophils Absolute: 0 10*3/uL (ref 0.0–0.1)
EOS ABS: 0.2 10*3/uL (ref 0.0–0.7)
EOS PCT: 1 %
HEMATOCRIT: 37.9 % (ref 36.0–46.0)
Hemoglobin: 11.7 g/dL — ABNORMAL LOW (ref 12.0–15.0)
Lymphocytes Relative: 10 %
Lymphs Abs: 1.5 10*3/uL (ref 0.7–4.0)
MCH: 26.4 pg (ref 26.0–34.0)
MCHC: 30.9 g/dL (ref 30.0–36.0)
MCV: 85.6 fL (ref 78.0–100.0)
MONO ABS: 1.7 10*3/uL — AB (ref 0.1–1.0)
Monocytes Relative: 11 %
NEUTROS ABS: 12 10*3/uL — AB (ref 1.7–7.7)
Neutrophils Relative %: 78 %
Platelets: 328 10*3/uL (ref 150–400)
RBC: 4.43 MIL/uL (ref 3.87–5.11)
RDW: 15.3 % (ref 11.5–15.5)
WBC: 15.4 10*3/uL — ABNORMAL HIGH (ref 4.0–10.5)

## 2015-01-29 LAB — URINALYSIS, ROUTINE W REFLEX MICROSCOPIC
Glucose, UA: NEGATIVE mg/dL
Hgb urine dipstick: NEGATIVE
Ketones, ur: 15 mg/dL — AB
NITRITE: NEGATIVE
Protein, ur: 30 mg/dL — AB
SPECIFIC GRAVITY, URINE: 1.022 (ref 1.005–1.030)
pH: 5 (ref 5.0–8.0)

## 2015-01-29 LAB — URINE MICROSCOPIC-ADD ON

## 2015-01-29 MED ORDER — SODIUM CHLORIDE 0.9 % IV BOLUS (SEPSIS)
1000.0000 mL | Freq: Once | INTRAVENOUS | Status: AC
  Administered 2015-01-30: 1000 mL via INTRAVENOUS

## 2015-01-29 MED ORDER — LORAZEPAM 1 MG PO TABS
0.5000 mg | ORAL_TABLET | Freq: Once | ORAL | Status: AC
  Administered 2015-01-30: 0.5 mg via ORAL
  Filled 2015-01-29: qty 1

## 2015-01-29 MED ORDER — CEFEPIME HCL 1 G IJ SOLR
1.0000 g | Freq: Once | INTRAMUSCULAR | Status: AC
  Administered 2015-01-30: 1 g via INTRAVENOUS

## 2015-01-29 MED ORDER — VANCOMYCIN HCL IN DEXTROSE 1-5 GM/200ML-% IV SOLN
1000.0000 mg | Freq: Once | INTRAVENOUS | Status: AC
  Administered 2015-01-30: 1000 mg via INTRAVENOUS
  Filled 2015-01-29: qty 200

## 2015-01-29 NOTE — ED Provider Notes (Signed)
CSN: [id number]     Arrival date & time 01/29/15  1934 History  By signing my name below, I, Irene Pap, attest that this documentation has been prepared under the direction and in the presence of Quintella Reichert, MD. Electronically Signed: Irene Pap, ED Scribe. 01/29/2015. 10:00 PM.   Chief Complaint  Patient presents with  . Fatigue   The history is provided by the spouse and a relative. No language interpreter was used.  HPI Comments (Level 5 Caveat due to dementia): Kelicia Youtz is a 82 y.o. Female with a hx of HTN, breast cancer, and dementia who presents to the Emergency Department complaining of gradually worsening fatigue onset 3 days ago. Pt has had a productive cough since last Thursday and recently finished a Z-Pack. Family reports associated chest pain per the pt, decreased appetite and fluid intake. Daughter states that pt has been grabbing at her shoulder as if she is in pain. Family states that she has been having increased urinary accidents of going to the bathroom on herself. Pt's family was advised by PCP to be seen in the ED. They deny fever and pt denies abdominal pain or leg pain; daughter states that the pt is not reliable when it comes to physical complaints as she will forget what she said 15 minutes after stating it. Pt has a port in place. Family states that the pt is at baseline in terms of her dementia.   Past Medical History  Diagnosis Date  . Hypertension   . Cancer ([place])     Breast/melanoma  . Dementia    Past Surgical History  Procedure Laterality Date  . Lung removal, partial Left 2013  . Mastectomy Bilateral 2013  . Abdominal wall mass resection  06/15/2012   Family History  Problem Relation Age of Onset  . Cancer Father     lung cancer  . Dementia Neg Hx    Social History  Substance Use Topics  . Smoking status: Former Smoker -- 1.50 packs/day for 30 years    Quit date: 01/20/1983  . Smokeless tobacco: None  . Alcohol Use: 0.6 oz/week     1 Glasses of wine per week     Comment: every a few days    OB History    No data available     Review of Systems  Unable to perform ROS: Dementia   Allergies  Quinolones  Home Medications   Prior to Admission medications   Medication Sig Start Date End Date Taking? Authorizing Provider  aspirin 81 MG tablet Take 81 mg by mouth daily.    Historical Provider, MD  atenolol (TENORMIN) 50 MG tablet Take 50 mg by mouth daily.    Historical Provider, MD  Cholecalciferol (VITAMIN D3) 1000 UNITS CAPS Take 1 capsule by mouth daily.    Historical Provider, MD  donepezil (ARICEPT) 10 MG tablet Take 1 tablet (10 mg total) by mouth at bedtime. 11/13/14   Melvenia Beam, MD  letrozole [facility]) 2.5 MG tablet Take 1 tablet (2.5 mg total) by mouth daily. 09/03/14   Truitt Merle, MD  levothyroxine (SYNTHROID, LEVOTHROID) 50 MCG tablet Take 50 mcg by mouth daily before breakfast.    Historical Provider, MD  losartan (COZAAR) 100 MG tablet Take 100 mg by mouth daily.    Historical Provider, MD  megestrol (MEGACE ES) 625 MG/5ML suspension Take 5 mLs (625 mg total) by mouth daily. 09/03/14   Truitt Merle, MD  NIFEDICAL XL 30 MG 24 hr tablet TK 1  T PO D 12/27/14   Historical Provider, MD  potassium chloride SA (K-DUR,KLOR-CON) 20 MEQ tablet 20 meq orally bid x 5 days then daily until next visit. 12/24/14   Truitt Merle, MD  simvastatin (ZOCOR) 40 MG tablet Take 40 mg by mouth daily.    Historical Provider, MD   BP 123/85 mmHg  Pulse 87  Temp(Src) 98.3 F (36.8 C) (Oral)  Resp 18  Wt 120 lb (54.432 kg)  SpO2 100% Physical Exam  Constitutional: She appears well-developed and well-nourished.  HENT:  Head: Normocephalic and atraumatic.  Cardiovascular: Normal rate and regular rhythm.   No murmur heard. Pulmonary/Chest: Effort normal and breath sounds normal. No respiratory distress.  Abdominal: Soft. There is no tenderness. There is no rebound and no guarding.  Musculoskeletal: She exhibits no edema or  tenderness.  Neurological: She is alert.  Markedly confused  Skin: Skin is warm and dry.  Nursing note and vitals reviewed.   ED Course  Procedures (including critical care time) DIAGNOSTIC STUDIES: Oxygen Saturation is 96% on RA, normal by my interpretation.    COORDINATION OF CARE: 9:19 PM-Discussed treatment plan which includes labs and x-ray with pt at bedside and pt agreed to plan.   Labs Review Labs Reviewed  URINALYSIS, ROUTINE W REFLEX MICROSCOPIC (NOT AT [facility]) - Abnormal; Notable for the following:    Color, Urine AMBER (*)    APPearance CLOUDY (*)    Bilirubin Urine SMALL (*)    Ketones, ur 15 (*)    Protein, ur 30 (*)    Leukocytes, UA TRACE (*)    All other components within normal limits  COMPREHENSIVE METABOLIC PANEL - Abnormal; Notable for the following:    Glucose, Bld 116 (*)    BUN 45 (*)    Creatinine, Ser 1.73 (*)    ALT 12 (*)    GFR calc non Af Amer 27 (*)    GFR calc Af Amer 31 (*)    All other components within normal limits  CBC WITH DIFFERENTIAL/PLATELET - Abnormal; Notable for the following:    WBC 15.4 (*)    Hemoglobin 11.7 (*)    Neutro Abs 12.0 (*)    Monocytes Absolute 1.7 (*)    All other components within normal limits  URINE MICROSCOPIC-ADD ON - Abnormal; Notable for the following:    Squamous Epithelial / LPF 0-5 (*)    Bacteria, UA FEW (*)    Casts HYALINE CASTS (*)    Crystals CA OXALATE CRYSTALS (*)    All other components within normal limits  URINE CULTURE    Imaging Review Dg Chest 2 View  01/29/2015  CLINICAL DATA:  Fever.  Fatigued. EXAM: CHEST  2 VIEW COMPARISON:  11/16/2014 FINDINGS: There is a right chest wall port a catheter with tip in the cavoatrial junction. The lungs are hyperinflated. There is no pleural effusion or edema. Several suspicious opacities are identified in both lungs which appear new from previous exam index lesion within the right base measures 3.1 cm. Cannot rule out pulmonary metastasis. IMPRESSION:  1. New suspicious pulmonary opacities identified. Cannot rule out melanoma metastasis to the lungs. Electronically Signed   By: Kerby Moors M.D.   On: 01/29/2015 20:23   I have personally reviewed and evaluated these images and lab results as part of my medical decision-making.   EKG Interpretation None      MDM   Final diagnoses:  Acute kidney injury [facility])   Patient with history of melanoma and advanced  dementia here for cough, fatigue, weakness. She is in no respiratory distress on examination but she is markedly confused and family states this is her baseline for her dementia. Chest x-ray is concerning for new pulmonary metastases from her known melanoma. CBC demonstrates a gross cytosis, BMP with acute kidney injury. Discussed with family these findings. We will treat for possible pneumonia given her respiratory symptoms and leukocytosis with these nodular opacities but there is a greater concern for progressive metastatic disease. Providing IV fluid hydration. Discussed with patient and family the findings of studies. Plan to admit for further treatment and workup.  I personally performed the services described in this documentation, which was scribed in my presence. The recorded information has been reviewed and is accurate.    Quintella Reichert, MD 01/30/15 [phone]

## 2015-01-29 NOTE — ED Notes (Signed)
I took blood samples, brought to lab, got new set of vitals and tried my best to calm patient family concerning wait time, etc. Lab stated samples were successful and results sent to Dr. for review.

## 2015-01-29 NOTE — ED Notes (Signed)
Recent completion of Zpack per husband. They have also observed pt coughing and blowing nose frequently. None noted upon assessment. Decreased appetite and fluid intake. Mucous membranes mildly dry. Here with husband and daughter.

## 2015-01-29 NOTE — ED Notes (Signed)
Pt with fatigue, cough since last Thursday-zpack started after call in with PCP Sunday-pt with increasing weakness-report per daughter-pt with hx dementia

## 2015-01-29 NOTE — ED Notes (Signed)
I escorted patient by wheelchair to restroom and back, daughter helped patient give sample by :hat" taken to lab.

## 2015-01-30 ENCOUNTER — Encounter (HOSPITAL_COMMUNITY): Payer: Self-pay | Admitting: Internal Medicine

## 2015-01-30 ENCOUNTER — Inpatient Hospital Stay (HOSPITAL_COMMUNITY): Payer: Medicare HMO

## 2015-01-30 DIAGNOSIS — Z85820 Personal history of malignant melanoma of skin: Secondary | ICD-10-CM | POA: Diagnosis not present

## 2015-01-30 DIAGNOSIS — G309 Alzheimer's disease, unspecified: Secondary | ICD-10-CM | POA: Diagnosis present

## 2015-01-30 DIAGNOSIS — D0502 Lobular carcinoma in situ of left breast: Secondary | ICD-10-CM

## 2015-01-30 DIAGNOSIS — F0280 Dementia in other diseases classified elsewhere without behavioral disturbance: Secondary | ICD-10-CM | POA: Diagnosis present

## 2015-01-30 DIAGNOSIS — D649 Anemia, unspecified: Secondary | ICD-10-CM | POA: Diagnosis present

## 2015-01-30 DIAGNOSIS — E039 Hypothyroidism, unspecified: Secondary | ICD-10-CM | POA: Diagnosis present

## 2015-01-30 DIAGNOSIS — J690 Pneumonitis due to inhalation of food and vomit: Secondary | ICD-10-CM | POA: Diagnosis present

## 2015-01-30 DIAGNOSIS — D0501 Lobular carcinoma in situ of right breast: Secondary | ICD-10-CM | POA: Diagnosis not present

## 2015-01-30 DIAGNOSIS — N179 Acute kidney failure, unspecified: Secondary | ICD-10-CM | POA: Diagnosis present

## 2015-01-30 DIAGNOSIS — Z681 Body mass index (BMI) 19 or less, adult: Secondary | ICD-10-CM | POA: Diagnosis not present

## 2015-01-30 DIAGNOSIS — Z9013 Acquired absence of bilateral breasts and nipples: Secondary | ICD-10-CM | POA: Diagnosis not present

## 2015-01-30 DIAGNOSIS — R05 Cough: Secondary | ICD-10-CM | POA: Diagnosis present

## 2015-01-30 DIAGNOSIS — C4359 Malignant melanoma of other part of trunk: Secondary | ICD-10-CM | POA: Diagnosis present

## 2015-01-30 DIAGNOSIS — R058 Other specified cough: Secondary | ICD-10-CM | POA: Diagnosis present

## 2015-01-30 DIAGNOSIS — F0391 Unspecified dementia with behavioral disturbance: Secondary | ICD-10-CM | POA: Diagnosis not present

## 2015-01-30 DIAGNOSIS — Z7982 Long term (current) use of aspirin: Secondary | ICD-10-CM | POA: Diagnosis not present

## 2015-01-30 DIAGNOSIS — R630 Anorexia: Secondary | ICD-10-CM | POA: Diagnosis present

## 2015-01-30 DIAGNOSIS — Z87891 Personal history of nicotine dependence: Secondary | ICD-10-CM | POA: Diagnosis not present

## 2015-01-30 DIAGNOSIS — E860 Dehydration: Secondary | ICD-10-CM | POA: Diagnosis present

## 2015-01-30 DIAGNOSIS — Z66 Do not resuscitate: Secondary | ICD-10-CM | POA: Diagnosis present

## 2015-01-30 DIAGNOSIS — Z79899 Other long term (current) drug therapy: Secondary | ICD-10-CM | POA: Diagnosis not present

## 2015-01-30 DIAGNOSIS — C3490 Malignant neoplasm of unspecified part of unspecified bronchus or lung: Secondary | ICD-10-CM

## 2015-01-30 DIAGNOSIS — J209 Acute bronchitis, unspecified: Secondary | ICD-10-CM | POA: Diagnosis present

## 2015-01-30 DIAGNOSIS — C439 Malignant melanoma of skin, unspecified: Secondary | ICD-10-CM | POA: Diagnosis not present

## 2015-01-30 DIAGNOSIS — Z79811 Long term (current) use of aromatase inhibitors: Secondary | ICD-10-CM

## 2015-01-30 DIAGNOSIS — E785 Hyperlipidemia, unspecified: Secondary | ICD-10-CM | POA: Diagnosis present

## 2015-01-30 DIAGNOSIS — Z801 Family history of malignant neoplasm of trachea, bronchus and lung: Secondary | ICD-10-CM | POA: Diagnosis not present

## 2015-01-30 DIAGNOSIS — Z853 Personal history of malignant neoplasm of breast: Secondary | ICD-10-CM | POA: Diagnosis not present

## 2015-01-30 DIAGNOSIS — I10 Essential (primary) hypertension: Secondary | ICD-10-CM | POA: Diagnosis present

## 2015-01-30 LAB — CBC WITH DIFFERENTIAL/PLATELET
Basophils Absolute: 0 10*3/uL (ref 0.0–0.1)
Basophils Relative: 0 %
EOS ABS: 0.1 10*3/uL (ref 0.0–0.7)
Eosinophils Relative: 1 %
HEMATOCRIT: 35.8 % — AB (ref 36.0–46.0)
HEMOGLOBIN: 11.3 g/dL — AB (ref 12.0–15.0)
LYMPHS ABS: 1.5 10*3/uL (ref 0.7–4.0)
Lymphocytes Relative: 12 %
MCH: 26.5 pg (ref 26.0–34.0)
MCHC: 31.6 g/dL (ref 30.0–36.0)
MCV: 84 fL (ref 78.0–100.0)
MONO ABS: 1.1 10*3/uL — AB (ref 0.1–1.0)
MONOS PCT: 9 %
NEUTROS PCT: 78 %
Neutro Abs: 9.3 10*3/uL — ABNORMAL HIGH (ref 1.7–7.7)
Platelets: 307 10*3/uL (ref 150–400)
RBC: 4.26 MIL/uL (ref 3.87–5.11)
RDW: 15.2 % (ref 11.5–15.5)
WBC: 12 10*3/uL — ABNORMAL HIGH (ref 4.0–10.5)

## 2015-01-30 LAB — COMPREHENSIVE METABOLIC PANEL
ALBUMIN: 2.9 g/dL — AB (ref 3.5–5.0)
ALK PHOS: 50 U/L (ref 38–126)
ALT: 12 U/L — AB (ref 14–54)
AST: 18 U/L (ref 15–41)
Anion gap: 11 (ref 5–15)
BUN: 32 mg/dL — ABNORMAL HIGH (ref 6–20)
CALCIUM: 8.9 mg/dL (ref 8.9–10.3)
CHLORIDE: 106 mmol/L (ref 101–111)
CO2: 23 mmol/L (ref 22–32)
CREATININE: 1.19 mg/dL — AB (ref 0.44–1.00)
GFR calc non Af Amer: 42 mL/min — ABNORMAL LOW (ref >60)
GFR, EST AFRICAN AMERICAN: 49 mL/min — AB (ref >60)
GLUCOSE: 96 mg/dL (ref 65–99)
Potassium: 3.8 mmol/L (ref 3.5–5.1)
SODIUM: 140 mmol/L (ref 135–145)
Total Bilirubin: 0.5 mg/dL (ref 0.3–1.2)
Total Protein: 6.6 g/dL (ref 6.5–8.1)

## 2015-01-30 MED ORDER — CEFEPIME HCL 1 G IJ SOLR
INTRAMUSCULAR | Status: AC
  Filled 2015-01-30: qty 1

## 2015-01-30 MED ORDER — IPRATROPIUM-ALBUTEROL 0.5-2.5 (3) MG/3ML IN SOLN
3.0000 mL | Freq: Four times a day (QID) | RESPIRATORY_TRACT | Status: DC
  Administered 2015-01-30 (×2): 3 mL via RESPIRATORY_TRACT
  Filled 2015-01-30 (×2): qty 3

## 2015-01-30 MED ORDER — NIFEDIPINE ER 30 MG PO TB24
30.0000 mg | ORAL_TABLET | Freq: Every day | ORAL | Status: DC
  Administered 2015-01-30 – 2015-02-01 (×3): 30 mg via ORAL
  Filled 2015-01-30 (×3): qty 1

## 2015-01-30 MED ORDER — SIMVASTATIN 40 MG PO TABS
40.0000 mg | ORAL_TABLET | Freq: Every day | ORAL | Status: DC
  Administered 2015-01-30 – 2015-02-01 (×3): 40 mg via ORAL
  Filled 2015-01-30 (×3): qty 1

## 2015-01-30 MED ORDER — PIPERACILLIN-TAZOBACTAM 3.375 G IVPB
3.3750 g | Freq: Three times a day (TID) | INTRAVENOUS | Status: DC
  Administered 2015-01-30 – 2015-02-01 (×6): 3.375 g via INTRAVENOUS
  Filled 2015-01-30 (×10): qty 50

## 2015-01-30 MED ORDER — LEVOTHYROXINE SODIUM 50 MCG PO TABS
50.0000 ug | ORAL_TABLET | Freq: Every day | ORAL | Status: DC
  Administered 2015-01-30 – 2015-02-01 (×3): 50 ug via ORAL
  Filled 2015-01-30 (×3): qty 1

## 2015-01-30 MED ORDER — PIPERACILLIN-TAZOBACTAM 3.375 G IVPB 30 MIN
3.3750 g | INTRAVENOUS | Status: AC
  Administered 2015-01-30: 3.375 g via INTRAVENOUS
  Filled 2015-01-30: qty 50

## 2015-01-30 MED ORDER — SODIUM CHLORIDE 0.9 % IV SOLN
INTRAVENOUS | Status: DC
  Administered 2015-01-30: 07:00:00 via INTRAVENOUS

## 2015-01-30 MED ORDER — LORAZEPAM 0.5 MG PO TABS
0.5000 mg | ORAL_TABLET | Freq: Two times a day (BID) | ORAL | Status: DC | PRN
  Administered 2015-01-30 – 2015-01-31 (×3): 0.5 mg via ORAL
  Filled 2015-01-30 (×3): qty 1

## 2015-01-30 MED ORDER — ACETAMINOPHEN 325 MG PO TABS: 650.0000 mg | ORAL_TABLET | Freq: Four times a day (QID) | ORAL | Status: DC | PRN

## 2015-01-30 MED ORDER — MEGESTROL ACETATE 400 MG/10ML PO SUSP
800.0000 mg | Freq: Every day | ORAL | Status: DC
  Administered 2015-01-30 – 2015-02-01 (×3): 800 mg via ORAL
  Filled 2015-01-30 (×3): qty 20

## 2015-01-30 MED ORDER — ONDANSETRON HCL 4 MG/2ML IJ SOLN: 4.0000 mg | Freq: Four times a day (QID) | INTRAMUSCULAR | Status: DC | PRN

## 2015-01-30 MED ORDER — ACETAMINOPHEN 650 MG RE SUPP: 650.0000 mg | Freq: Four times a day (QID) | RECTAL | Status: DC | PRN

## 2015-01-30 MED ORDER — VITAMIN D 1000 UNITS PO TABS
1000.0000 [IU] | ORAL_TABLET | Freq: Every day | ORAL | Status: DC
  Administered 2015-01-30 – 2015-02-01 (×3): 1000 [IU] via ORAL
  Filled 2015-01-30 (×3): qty 1

## 2015-01-30 MED ORDER — ENOXAPARIN SODIUM 30 MG/0.3ML ~~LOC~~ SOLN
30.0000 mg | Freq: Every day | SUBCUTANEOUS | Status: DC
  Administered 2015-01-30 – 2015-02-01 (×3): 30 mg via SUBCUTANEOUS
  Filled 2015-01-30 (×3): qty 0.3

## 2015-01-30 MED ORDER — SODIUM CHLORIDE 0.9 % IV SOLN: INTRAVENOUS | Status: DC

## 2015-01-30 MED ORDER — ONDANSETRON HCL 4 MG PO TABS: 4.0000 mg | ORAL_TABLET | Freq: Four times a day (QID) | ORAL | Status: DC | PRN

## 2015-01-30 MED ORDER — BENZONATATE 100 MG PO CAPS
200.0000 mg | ORAL_CAPSULE | Freq: Three times a day (TID) | ORAL | Status: DC
  Administered 2015-01-30 – 2015-02-01 (×6): 200 mg via ORAL
  Filled 2015-01-30 (×7): qty 2

## 2015-01-30 MED ORDER — ALBUTEROL SULFATE (2.5 MG/3ML) 0.083% IN NEBU: 2.5000 mg | INHALATION_SOLUTION | RESPIRATORY_TRACT | Status: DC | PRN

## 2015-01-30 MED ORDER — HYDRALAZINE HCL 20 MG/ML IJ SOLN: 10.0000 mg | INTRAMUSCULAR | Status: DC | PRN

## 2015-01-30 MED ORDER — ASPIRIN EC 81 MG PO TBEC
81.0000 mg | DELAYED_RELEASE_TABLET | Freq: Every day | ORAL | Status: DC
  Administered 2015-01-30 – 2015-02-01 (×3): 81 mg via ORAL
  Filled 2015-01-30 (×3): qty 1

## 2015-01-30 MED ORDER — DONEPEZIL HCL 10 MG PO TABS
10.0000 mg | ORAL_TABLET | Freq: Every day | ORAL | Status: DC
  Administered 2015-01-30 – 2015-01-31 (×2): 10 mg via ORAL
  Filled 2015-01-30 (×3): qty 1

## 2015-01-30 MED ORDER — ATENOLOL 50 MG PO TABS
50.0000 mg | ORAL_TABLET | Freq: Every day | ORAL | Status: DC
  Administered 2015-01-30 – 2015-02-01 (×3): 50 mg via ORAL
  Filled 2015-01-30 (×3): qty 1

## 2015-01-30 MED ORDER — LETROZOLE 2.5 MG PO TABS
2.5000 mg | ORAL_TABLET | Freq: Every day | ORAL | Status: DC
  Administered 2015-01-30 – 2015-02-01 (×3): 2.5 mg via ORAL
  Filled 2015-01-30 (×3): qty 1

## 2015-01-30 NOTE — Progress Notes (Signed)
Received phone call from Radiology stating that pt had new area of patchy infiltrate in rt lung.  Recommendation is to follow-up  With PA and lateral CXR in 3-4 wks.  MD made aware.  No new orders.

## 2015-01-30 NOTE — Progress Notes (Signed)
SLP Cancellation Note  Patient Details Name: Alexa Hardin MRN: [id number] DOB: 24-Jun-1932   Cancelled treatment:       Reason Eval/Treat Not Completed: Patient at procedure or test/unavailable. Will return as able.   Germain Osgood, M.A. CCC-SLP [phone]  Germain Osgood 01/30/2015, 9:33 AM

## 2015-01-30 NOTE — Progress Notes (Addendum)
PROGRESS NOTE    Alexa Hardin ZWC:[id number] DOB: [birth year] DOA: 01/29/2015 PCP: Marylene Land, MD  Primary Oncologist: Dr. Truitt Merle  HPI/Brief narrative 82 year old female with history of dementia, metastatic melanoma on immunotherapy, HTN, HLD, breast cancer, was brought to the [facility] ED on 01/29/15 with complaints of persistent cough. Symptoms started 5 days PTA and treated by PCP with a course of Z-Pak despite which she continued to have cough. Family contacted PCP on 1/10 and was advised to come to the ED for further evaluation. In the ED, chest x-ray showed new opacities concerning for metastatic lung lesions, lab work showed acute kidney injury. Admitted for further evaluation and management.   Assessment/Plan:   Possible aspiration pneumonia versus community-acquired pneumonia (right lung, all lobes) - Treated outpatient with Z-Pak for? Acute bronchitis without significant improvement. - Chest x-ray in ED showed new suspicious pulmonary opacities and could not rule out melanoma metastatic system the lungs. - After discussing with memory oncologist, obtained CT chest without contrast which confirmed right lung infiltrates consistent with pneumonia. - Continue IV Zosyn - Speech therapy consultation for swallow eval is pending. - Add Tessalon pearls for persistent hacking dry cough. - Will need follow-up imaging, possibly CT chest in 3-4 weeks to ensure clearance of pneumonia findings.  Possible UTI - Zosyn will cover  Dehydration - Due to poor oral intake related to cough and pneumonia. IV fluids. Improved  Acute kidney injury - Secondary to dehydration. IV fluids. Hold ARB for now.  Essential hypertension - Hold ARB for now secondary to acute kidney injury. Continue atenolol, Procardia XL & when necessary IV hydralazine.  Moderate dementia - Continue Aricept. No agitation.  Hypothyroid - Synthroid.  Metastatic melanoma to lung, abdominal wall, subcutaneous soft  tissue and lymph nodes - Currently on immunotherapy. PET scan 11/15/14 showed no evidence of active disease - Discussed with Dr. Sibyl Parr chest CT shows worsening metastasis, patient said to be doing reasonably well and may hold off on palliative care consultation.  Breast cancer - Status post bilateral mastectomies - Continue adjuvant Femara  Anemia - Stable  DVT prophylaxis: Lovenox Code Status: DO NOT RESUSCITATE Family Communication: Discussed with spouse at bedside on 1/11 Disposition Plan: DC home when medically stable, possibly in 3-4 days.   Consultants:  None  Procedures:  None  Antimicrobials:  IV cefepime 1 dose  IV Zosyn 1/10 >   Subjective: Patient pleasantly confused. Noted to be having intractable hacking dry cough. As per spouse, slightly better compared to admission.  Objective: Filed Vitals:   01/30/15 0229 01/30/15 0452 01/30/15 1011 01/30/15 1036  BP: 132/62 129/50  118/53  Pulse: 88 75 76 83  Temp: 98.1 F (36.7 C) 98.3 F (36.8 C)    TempSrc: Oral Oral    Resp: '16 22 18   '$ Height: '5\' 9"'$  (1.753 m)     Weight: 56.3 kg (124 lb 1.9 oz)     SpO2: 94% 91% 94%     Intake/Output Summary (Last 24 hours) at 01/30/15 1225 Last data filed at 01/30/15 0919  Gross per 24 hour  Intake    120 ml  Output    600 ml  Net   -480 ml   Filed Weights   01/29/15 1942 01/30/15 0229  Weight: 54.432 kg (120 lb) 56.3 kg (124 lb 1.9 oz)    Exam:  General exam: Elderly frail female sitting up comfortably in bed and coughing. Respiratory system: Harsh breath sounds bilaterally, right >left with scattered occasional expiratory rhonchi  and few basal crackles. No increased work of breathing. Cardiovascular system: S1 & S2 heard, RRR. No JVD, murmurs, gallops, clicks or pedal edema. Gastrointestinal system: Abdomen is nondistended, soft and nontender. Normal bowel sounds heard. Central nervous system: Alert and oriented to self and partly to place. No focal  neurological deficits. Extremities: Symmetric 5 x 5 power.   Data Reviewed: Basic Metabolic Panel:  Recent Labs Lab 01/29/15 2225 01/30/15 0625  NA 136 140  K 4.0 3.8  CL 105 106  CO2 22 23  GLUCOSE 116* 96  BUN 45* 32*  CREATININE 1.73* 1.19*  CALCIUM 8.9 8.9   Liver Function Tests:  Recent Labs Lab 01/29/15 2225 01/30/15 0625  AST 22 18  ALT 12* 12*  ALKPHOS 55 50  BILITOT 0.6 0.5  PROT 7.8 6.6  ALBUMIN 3.7 2.9*   No results for input(s): LIPASE, AMYLASE in the last 168 hours. No results for input(s): AMMONIA in the last 168 hours. CBC:  Recent Labs Lab 01/29/15 2225 01/30/15 0625  WBC 15.4* 12.0*  NEUTROABS 12.0* 9.3*  HGB 11.7* 11.3*  HCT 37.9 35.8*  MCV 85.6 84.0  PLT 328 307   Cardiac Enzymes: No results for input(s): CKTOTAL, CKMB, CKMBINDEX, TROPONINI in the last 168 hours. BNP (last 3 results) No results for input(s): PROBNP in the last 8760 hours. CBG: No results for input(s): GLUCAP in the last 168 hours.  No results found for this or any previous visit (from the past 240 hour(s)).       Studies: Dg Chest 2 View  01/29/2015  CLINICAL DATA:  Fever.  Fatigued. EXAM: CHEST  2 VIEW COMPARISON:  11/16/2014 FINDINGS: There is a right chest wall port a catheter with tip in the cavoatrial junction. The lungs are hyperinflated. There is no pleural effusion or edema. Several suspicious opacities are identified in both lungs which appear new from previous exam index lesion within the right base measures 3.1 cm. Cannot rule out pulmonary metastasis. IMPRESSION: 1. New suspicious pulmonary opacities identified. Cannot rule out melanoma metastasis to the lungs. Electronically Signed   By: Kerby Moors M.D.   On: 01/29/2015 20:23   Ct Chest Wo Contrast  01/30/2015  CLINICAL DATA:  Persistent cough EXAM: CT CHEST WITHOUT CONTRAST TECHNIQUE: Multidetector CT imaging of the chest was performed following the standard protocol without IV contrast.  COMPARISON:  11/16/2014, 01/29/2015 FINDINGS: The lungs are well aerated bilaterally. Stable changes are noted in the anterior aspect of the right upper lobe when compared with the prior PET-CT examination. New areas of patchy infiltrate are noted in the lateral aspect of the right middle lobe as well as the anterior aspect of the right lower lobe. These are consistent with acute pneumonic infiltrates. Minimal infiltrative changes are also noted in the inferior aspect of the right upper lobe. Scarring is again noted in the medial aspect of the right middle lobe and lingula which is stable. A new small right-sided pleural effusion is noted. Aortic calcifications are seen without aneurysmal dilatation. A right chest wall port is seen. No significant hilar or mediastinal adenopathy is noted. The upper abdomen reveals no acute abnormality. The osseous structures show mild degenerative change without acute abnormality. IMPRESSION: New areas of patchy infiltrate within the right lung as described. Followup PA and lateral chest X-ray is recommended in 3-4 weeks following trial of antibiotic therapy to ensure resolution and exclude underlying malignancy. No other focal abnormality is noted. These results will be called to the ordering clinician or  representative by the Radiologist Assistant, and communication documented in the PACS or zVision Dashboard. Electronically Signed   By: Inez Catalina M.D.   On: 01/30/2015 10:07        Scheduled Meds: . aspirin EC  81 mg Oral Daily  . atenolol  50 mg Oral Daily  . benzonatate  200 mg Oral TID  . ceFEPIme      . cholecalciferol  1,000 Units Oral Daily  . donepezil  10 mg Oral QHS  . enoxaparin (LOVENOX) injection  30 mg Subcutaneous Daily  . ipratropium-albuterol  3 mL Nebulization Q6H  . letrozole  2.5 mg Oral Daily  . levothyroxine  50 mcg Oral QAC breakfast  . megestrol  800 mg Oral Daily  . NIFEdipine  30 mg Oral Daily  . piperacillin-tazobactam (ZOSYN)  IV   3.375 g Intravenous 3 times per day  . simvastatin  40 mg Oral Daily   Continuous Infusions: . sodium chloride 50 mL/hr at 01/30/15 1043    Principal Problem:   Aspiration pneumonia ([place]) Active Problems:   Malignant melanoma of unknown origin ([place])   Alzheimer's disease   Acute kidney injury ([place])   Productive cough   Hypothyroidism    Time spent: 40 minutes.    Vernell Leep, MD, FACP, FHM. [facility] Pager [phone]  If 7PM-7AM, please contact night-coverage [email or web address] Password [email or web address] 01/30/2015, 12:25 PM    LOS: 0 days

## 2015-01-30 NOTE — Progress Notes (Signed)
Patient ID: Alexa Hardin, female   DOB: 30-Apr-1932, 82 y.o.   MRN: [id number]  82 yo female with a PMH of breast cancer, Melanoma, dementia, HTN who presented to the [facility] ED with complaints of cough, decreased, appetite, worsening fatigue despite having finished a Z-pack treatment recently. She has mild elevation of creatinine/BUN, leukocytosis and chest x-ray shows new opacities suspicious for pneumonia vs metastatic melanoma lesions to the lungs. She was started on cefepime and vancomycin. Accepted as an inpatient to med-surg bed. Consider palliative medicine consult in AM.

## 2015-01-30 NOTE — Evaluation (Signed)
Clinical/Bedside Swallow Evaluation Patient Details  Name: Raechel Marcos MRN: [id number] Date of Birth: 08-Sep-1932  Today's Date: 01/30/2015 Time: SLP Start Time (ACUTE ONLY): 3662 SLP Stop Time (ACUTE ONLY): 1500 SLP Time Calculation (min) (ACUTE ONLY): 15 min  Past Medical History:  Past Medical History  Diagnosis Date  . Hypertension   . Cancer ([place])     Breast/melanoma  . Dementia    Past Surgical History:  Past Surgical History  Procedure Laterality Date  . Lung removal, partial Left 2013  . Mastectomy Bilateral 2013  . Abdominal wall mass resection  06/15/2012   HPI:  82 year old female with history of dementia, metastatic melanoma on immunotherapy, HTN, HLD, breast cancer, was brought to the [facility] ED on 01/29/15 with complaints of persistent cough. CXR shows opacities concerning for metastatic lung lesions versus PNA.   Assessment / Plan / Recommendation Clinical Impression  Pt is very confused and restless at this time, requiring constant redirection to lunch tray with minimal intake as a result. She has a baseline intermittent cough, which sounds consistent with occasional cough heard throughout intake. Coughing did not immediately follow swallows, vocal quality remained clear, and cough sounded dry. Recommend to continue current diet but with SLP f/u due to limited observation today and concern for infiltrates on CXR.     Aspiration Risk  Mild aspiration risk (but likely fluctuating due to mentation)    Diet Recommendation  Regular diet, thin liquids Full supervision   Medication Administration: Whole meds with liquid (may need to crush pending mentation)    Other  Recommendations Oral Care Recommendations: Oral care BID   Follow up Recommendations   (tba)    Frequency and Duration min 2x/week  2 weeks       Prognosis        Swallow Study   General HPI: 82 year old female with history of dementia, metastatic melanoma on immunotherapy, HTN, HLD, breast  cancer, was brought to the [facility] ED on 01/29/15 with complaints of persistent cough. CXR shows opacities concerning for metastatic lung lesions versus PNA. Type of Study: Bedside Swallow Evaluation Previous Swallow Assessment: none in chart Diet Prior to this Study: Regular;Thin liquids Temperature Spikes Noted: No Respiratory Status: Room air History of Recent Intubation: No Behavior/Cognition: Alert;Cooperative;Requires cueing Oral Cavity Assessment: Within Functional Limits Oral Care Completed by SLP: No Oral Cavity - Dentition: Adequate natural dentition Vision: Functional for self-feeding Self-Feeding Abilities: Able to feed self;Needs assist Patient Positioning: Upright in bed Baseline Vocal Quality: Normal    Oral/Motor/Sensory Function Overall Oral Motor/Sensory Function:  (appears WFL during functional tasks)   Ice Chips Ice chips: Not tested   Thin Liquid Thin Liquid: Within functional limits Presentation: Self Fed;Straw    Nectar Thick Nectar Thick Liquid: Not tested   Honey Thick Honey Thick Liquid: Not tested   Puree Puree: Not tested   Solid   GO    Solid: Within functional limits Presentation: Self Fed       Germain Osgood, M.A. CCC-SLP [phone]  Germain Osgood 01/30/2015,3:12 PM

## 2015-01-30 NOTE — ED Notes (Signed)
Attempted to call report to 6N Bed 3.

## 2015-01-30 NOTE — H&P (Addendum)
[facility] History and Physical  Alexa Hardin ZOX:[id number] DOB: 1932-03-04 DOA: 01/29/2015  Referring physician: Patient was transferred from [facility] [place]. PCP: Alexa Land, MD  Specialists: Dr. Burr Medico. Oncologist.  Chief Complaint: Productive cough.  History obtained from patient's husband as patient has dementia.  HPI: Alexa Hardin is a 82 y.o. female with history of dementia, metastatic melanoma on immunotherapy, hypertension and hyperlipidemia and breast cancer was brought to the ER the patient had persistent productive cough. Patient's symptoms started 5 days ago and patient's primary care physician at: Z-Pak. Despite daily which patient still had productive cough. In the ER chest x-ray shows new opacities concerning for metastasis. An labs show acute renal failure. As per patient's husband last few days patient has not been eating well. Patient was given fluid bolus and admitted for acute renal failure and ongoing productive cough with x-ray showing recurrence of possible metastasis. On exam patient is not in distress and patient is actively coughing productive of sputum. Abdomen appears benign.   Review of Systems: As presented in the history of presenting illness, rest negative.  Past Medical History  Diagnosis Date  . Hypertension   . Cancer ([place])     Breast/melanoma  . Dementia    Past Surgical History  Procedure Laterality Date  . Lung removal, partial Left 2013  . Mastectomy Bilateral 2013  . Abdominal wall mass resection  06/15/2012   Social History:  reports that she quit smoking about 32 years ago. She does not have any smokeless tobacco history on file. She reports that she drinks about 0.6 oz of alcohol per week. She reports that she does not use illicit drugs. Where does patient live home. Can patient participate in ADLs? No.  Allergies  Allergen Reactions  . Quinolones Rash    Ciprofloxacin.    Family History:  Family History   Problem Relation Age of Onset  . Cancer Father     lung cancer  . Dementia Neg Hx       Prior to Admission medications   Medication Sig Start Date End Date Taking? Authorizing Provider  aspirin 81 MG tablet Take 81 mg by mouth daily.    Historical Provider, MD  atenolol (TENORMIN) 50 MG tablet Take 50 mg by mouth daily.    Historical Provider, MD  Cholecalciferol (VITAMIN D3) 1000 UNITS CAPS Take 1 capsule by mouth daily.    Historical Provider, MD  donepezil (ARICEPT) 10 MG tablet Take 1 tablet (10 mg total) by mouth at bedtime. 11/13/14   Melvenia Beam, MD  letrozole [facility]) 2.5 MG tablet Take 1 tablet (2.5 mg total) by mouth daily. 09/03/14   Truitt Merle, MD  levothyroxine (SYNTHROID, LEVOTHROID) 50 MCG tablet Take 50 mcg by mouth daily before breakfast.    Historical Provider, MD  losartan (COZAAR) 100 MG tablet Take 100 mg by mouth daily.    Historical Provider, MD  megestrol (MEGACE ES) 625 MG/5ML suspension Take 5 mLs (625 mg total) by mouth daily. 09/03/14   Truitt Merle, MD  NIFEDICAL XL 30 MG 24 hr tablet TK 1 T PO D 12/27/14   Historical Provider, MD  potassium chloride SA (K-DUR,KLOR-CON) 20 MEQ tablet 20 meq orally bid x 5 days then daily until next visit. 12/24/14   Truitt Merle, MD  simvastatin (ZOCOR) 40 MG tablet Take 40 mg by mouth daily.    Historical Provider, MD    Physical Exam: Filed Vitals:   01/29/15 8119 01/30/15 1478 01/30/15 0125 01/30/15 2956  BP: 123/85 118/55 117/62 132/62  Pulse: 87 77 78 88  Temp: 98.3 F (36.8 C)  98.6 F (37 C) 98.1 F (36.7 C)  TempSrc: Oral   Oral  Resp: '18 16  16  '$ Height:    '5\' 9"'$  (1.753 m)  Weight:    56.3 kg (124 lb 1.9 oz)  SpO2: 100% 98% 95% 94%     General:  Moderately built and nourished.  Eyes: Anicteric no pallor.  ENT: No discharge from the ears eyes nose or mouth.  Neck: No mass felt.  Cardiovascular: S1 and S2 heard.  Respiratory: No rhonchi or crepitations.  Abdomen: Soft nontender bowel sounds  present.  Skin: No rash.  Musculoskeletal: No edema.  Psychiatric: Patient is alert and awake and oriented to her name.  Neurologic: Alert awake oriented to her name and follows commands and moves all extremities.  Labs on Admission:  Basic Metabolic Panel:  Recent Labs Lab 01/29/15 2225  NA 136  K 4.0  CL 105  CO2 22  GLUCOSE 116*  BUN 45*  CREATININE 1.73*  CALCIUM 8.9   Liver Function Tests:  Recent Labs Lab 01/29/15 2225  AST 22  ALT 12*  ALKPHOS 55  BILITOT 0.6  PROT 7.8  ALBUMIN 3.7   No results for input(s): LIPASE, AMYLASE in the last 168 hours. No results for input(s): AMMONIA in the last 168 hours. CBC:  Recent Labs Lab 01/29/15 2225  WBC 15.4*  NEUTROABS 12.0*  HGB 11.7*  HCT 37.9  MCV 85.6  PLT 328   Cardiac Enzymes: No results for input(s): CKTOTAL, CKMB, CKMBINDEX, TROPONINI in the last 168 hours.  BNP (last 3 results) No results for input(s): BNP in the last 8760 hours.  ProBNP (last 3 results) No results for input(s): PROBNP in the last 8760 hours.  CBG: No results for input(s): GLUCAP in the last 168 hours.  Radiological Exams on Admission: Dg Chest 2 View  01/29/2015  CLINICAL DATA:  Fever.  Fatigued. EXAM: CHEST  2 VIEW COMPARISON:  11/16/2014 FINDINGS: There is a right chest wall port a catheter with tip in the cavoatrial junction. The lungs are hyperinflated. There is no pleural effusion or edema. Several suspicious opacities are identified in both lungs which appear new from previous exam index lesion within the right base measures 3.1 cm. Cannot rule out pulmonary metastasis. IMPRESSION: 1. New suspicious pulmonary opacities identified. Cannot rule out melanoma metastasis to the lungs. Electronically Signed   By: Alexa Hardin M.D.   On: 01/29/2015 20:23    Assessment/Plan Principal Problem:   Aspiration pneumonia ([place]) Active Problems:   Malignant melanoma of unknown origin ([place])   Alzheimer's disease   Acute kidney  injury ([place])   Productive cough   Hypothyroidism   1. Productive cough most likely from aspiration pneumonia - will get swallow evaluation. I have placed patient on Zosyn for now. 2. Acute renal failure probably from poor oral intake and dehydration - gently hydrate. Hold Cozaar. Recheck metabolic panel in a.m. Closlely follow intake output. 3. Abnormal chest x-ray with possible recurrence of metastasis with known history of melanoma and breast cancer - if patient's creatinine improves get CT chest with contrast otherwise may need to get CT chest without contrast. Following CT chest may discuss with patient's oncologist. Palliative care team consult has been requested. 4. Hypertension - since patient's Cozaar is on hold I have placed patient on when necessary IV hydralazine. Continue atenolol. 5. Dementia - continue Aricept. 6. Hypothyroidism on  Synthroid. 7. Mild anemia - follow CBC. 8. History of breast cancer on Femara. 9. Possible UTI.   DVT Prophylaxis Lovenox.  Code Status: DO NOT RESUSCITATE.  Family Communication: Discussed with patient's husband.  Disposition Plan: Admit to inpatient.    Denario Bagot N. [facility] Pager [phone].  If 7PM-7AM, please contact night-coverage [email or web address] Password [facility] 01/30/2015, 4:32 AM

## 2015-01-30 NOTE — ED Notes (Signed)
Pt repeatedly stating that she does not want to take medication, told nurse that she would not allow [place] to get access to her port or attempt an iv.  Pt swallowed PO ativan with coaxing and family informed that we would wait for the medication to start to work before attempting an IV access.  Dr. Ralene Bathe speaking to husband now without pt and pt's daughter.

## 2015-01-30 NOTE — Care Management Note (Signed)
Case Management Note  Patient Details  Name: Loie Jahr MRN: [id number] Date of Birth: Jun 19, 1932  Subjective/Objective:                    Action/Plan:  Initial UR completed .  Continue to follow. Would recommend PT/OT evals to determine discharge needs. Expected Discharge Date:                  Expected Discharge Plan:     In-House Referral:     Discharge planning Services     Post Acute Care Choice:    Choice offered to:     DME Arranged:    DME Agency:     HH Arranged:    HH Agency:     Status of Service:  In process, will continue to follow  Medicare Important Message Given:    Date Medicare IM Given:    Medicare IM give by:    Date Additional Medicare IM Given:    Additional Medicare Important Message give by:     If discussed at [place] of Stay Meetings, dates discussed:    Additional Comments:  Marilu Favre, RN 01/30/2015, 7:56 AM

## 2015-01-30 NOTE — Progress Notes (Signed)
ANTIBIOTIC CONSULT NOTE - INITIAL  Pharmacy Consult for Zosyn Indication: asp pna  Allergies  Allergen Reactions  . Quinolones Rash    Ciprofloxacin.    Patient Measurements: Height: '5\' 9"'$  (175.3 cm) Weight: 124 lb 1.9 oz (56.3 kg) IBW/kg (Calculated) : 66.2  Vital Signs: Temp: 98.1 F (36.7 C) (01/11 0229) Temp Source: Oral (01/11 0229) BP: 132/62 mmHg (01/11 0229) Pulse Rate: 88 (01/11 0229) Intake/Output from previous day:   Intake/Output from this shift:    Labs:  Recent Labs  01/29/15 2225  WBC 15.4*  HGB 11.7*  PLT 328  CREATININE 1.73*   Estimated Creatinine Clearance: 23.1 mL/min (by C-G formula based on Cr of 1.73). No results for input(s): VANCOTROUGH, VANCOPEAK, VANCORANDOM, GENTTROUGH, GENTPEAK, GENTRANDOM, TOBRATROUGH, TOBRAPEAK, TOBRARND, AMIKACINPEAK, AMIKACINTROU, AMIKACIN in the last 72 hours.   Microbiology: No results found for this or any previous visit (from the past 720 hour(s)).  Medical History: Past Medical History  Diagnosis Date  . Hypertension   . Cancer ([place])     Breast/melanoma  . Dementia     Medications:  Awaiting home med rec  Assessment: 82 y.o. F presents with cough, decreased appetite. Pt recently finished Z-pak. CXR shows new opacities suspicious for PNA. Cefepime and Vancomycin given in [facility]. Admitting MD changing to Zosyn for asp pna. SCr 1.73, estimated CrCl 23 ml/min. WBC elevated to 15.6. Afeb.  Goal of Therapy:  Resolution of infection  Plan:  Zosyn 3.375gm IV now over 30 min then 3.375gm IV q8h - subsequent doses over 4 hours Will f/u micro data, renal function, and pt's clinical condition  Sherlon Handing, PharmD, BCPS Clinical pharmacist, pager [phone] 01/30/2015,4:31 AM

## 2015-01-30 NOTE — Progress Notes (Signed)
Alexa Hardin   DOB:July 31, 1932   TO#:[id number]   XIP#:[id number]  Subjective: Pt is well known to me, she has been receiving involvement for her metastatic melanoma under my care. She was admitted to [facility] for worsening fatigue and productive cough. No fever at home. She was initially treated with erythromycin and her primary care physician as outpatient. Her family members have been having cold symptoms lately also. I spoke with patient's daughter at bedside, patient was very drowsy and sleeping when I saw her.   Objective:  Filed Vitals:   01/30/15 1355 01/30/15 1518  BP: 121/56   Pulse: 77 68  Temp: 97.8 F (36.6 C)   Resp: 18 18    Body mass index is 18.32 kg/(m^2).  Intake/Output Summary (Last 24 hours) at 01/30/15 1756 Last data filed at 01/30/15 1553  Gross per 24 hour  Intake    252 ml  Output   1050 ml  Net   -798 ml     Sclerae unicteric  Oropharynx clear  No peripheral adenopathy  Lungs clear -- no rales or rhonchi  Heart regular rate and rhythm  Abdomen benign  MSK no focal spinal tenderness, no peripheral edema  Neuro nonfocal   CBG (last 3)  No results for input(s): GLUCAP in the last 72 hours.   Labs:  Lab Results  Component Value Date   WBC 12.0* 01/30/2015   HGB 11.3* 01/30/2015   HCT 35.8* 01/30/2015   MCV 84.0 01/30/2015   PLT 307 01/30/2015   NEUTROABS 9.3* 01/30/2015    '@LASTCHEMISTRY'$ @  Urine Studies No results for input(s): UHGB, CRYS in the last 72 hours.  Invalid input(s): UACOL, UAPR, USPG, UPH, UTP, UGL, UKET, UBIL, UNIT, UROB, ULEU, UEPI, UWBC, URBC, UBAC, CAST, UCOM, BILUA  Basic Metabolic Panel:  Recent Labs Lab 01/29/15 2225 01/30/15 0625  NA 136 140  K 4.0 3.8  CL 105 106  CO2 22 23  GLUCOSE 116* 96  BUN 45* 32*  CREATININE 1.73* 1.19*  CALCIUM 8.9 8.9   GFR Estimated Creatinine Clearance: 33.5 mL/min (by C-G formula based on Cr of 1.19). Liver Function Tests:  Recent Labs Lab 01/29/15 2225  01/30/15 0625  AST 22 18  ALT 12* 12*  ALKPHOS 55 50  BILITOT 0.6 0.5  PROT 7.8 6.6  ALBUMIN 3.7 2.9*   No results for input(s): LIPASE, AMYLASE in the last 168 hours. No results for input(s): AMMONIA in the last 168 hours. Coagulation profile No results for input(s): INR, PROTIME in the last 168 hours.  CBC:  Recent Labs Lab 01/29/15 2225 01/30/15 0625  WBC 15.4* 12.0*  NEUTROABS 12.0* 9.3*  HGB 11.7* 11.3*  HCT 37.9 35.8*  MCV 85.6 84.0  PLT 328 307   Cardiac Enzymes: No results for input(s): CKTOTAL, CKMB, CKMBINDEX, TROPONINI in the last 168 hours. BNP: Invalid input(s): POCBNP CBG: No results for input(s): GLUCAP in the last 168 hours. D-Dimer No results for input(s): DDIMER in the last 72 hours. Hgb A1c No results for input(s): HGBA1C in the last 72 hours. Lipid Profile No results for input(s): CHOL, HDL, LDLCALC, TRIG, CHOLHDL, LDLDIRECT in the last 72 hours. Thyroid function studies No results for input(s): TSH, T4TOTAL, T3FREE, THYROIDAB in the last 72 hours.  Invalid input(s): FREET3 Anemia work up No results for input(s): VITAMINB12, FOLATE, FERRITIN, TIBC, IRON, RETICCTPCT in the last 72 hours. Microbiology No results found for this or any previous visit (from the past 240 hour(s)).    Studies:  Dg Chest 2 View  01/29/2015  CLINICAL DATA:  Fever.  Fatigued. EXAM: CHEST  2 VIEW COMPARISON:  11/16/2014 FINDINGS: There is a right chest wall port a catheter with tip in the cavoatrial junction. The lungs are hyperinflated. There is no pleural effusion or edema. Several suspicious opacities are identified in both lungs which appear new from previous exam index lesion within the right base measures 3.1 cm. Cannot rule out pulmonary metastasis. IMPRESSION: 1. New suspicious pulmonary opacities identified. Cannot rule out melanoma metastasis to the lungs. Electronically Signed   By: Kerby Moors M.D.   On: 01/29/2015 20:23   Ct Chest Wo Contrast  01/30/2015   CLINICAL DATA:  Persistent cough EXAM: CT CHEST WITHOUT CONTRAST TECHNIQUE: Multidetector CT imaging of the chest was performed following the standard protocol without IV contrast. COMPARISON:  11/16/2014, 01/29/2015 FINDINGS: The lungs are well aerated bilaterally. Stable changes are noted in the anterior aspect of the right upper lobe when compared with the prior PET-CT examination. New areas of patchy infiltrate are noted in the lateral aspect of the right middle lobe as well as the anterior aspect of the right lower lobe. These are consistent with acute pneumonic infiltrates. Minimal infiltrative changes are also noted in the inferior aspect of the right upper lobe. Scarring is again noted in the medial aspect of the right middle lobe and lingula which is stable. A new small right-sided pleural effusion is noted. Aortic calcifications are seen without aneurysmal dilatation. A right chest wall port is seen. No significant hilar or mediastinal adenopathy is noted. The upper abdomen reveals no acute abnormality. The osseous structures show mild degenerative change without acute abnormality. IMPRESSION: New areas of patchy infiltrate within the right lung as described. Followup PA and lateral chest X-ray is recommended in 3-4 weeks following trial of antibiotic therapy to ensure resolution and exclude underlying malignancy. No other focal abnormality is noted. These results will be called to the ordering clinician or representative by the Radiologist Assistant, and communication documented in the PACS or zVision Dashboard. Electronically Signed   By: Inez Catalina M.D.   On: 01/30/2015 10:07    Assessment: 82 y.o.  1. Probable right lung pneumonia 2. AKI, improved after hydration 3. Fatigue and anorexia, likely related to #1 4. Metastatic melanoma, on involvement, no evidence of disease on recent PET 11/16/2014 5 History of Breast cancer, on adjuvant letrozole  6. Dementia 7. Hypothyroidism    Plan:   -I reviewed her CT chest from today in person, and discussed with patient's daughter. The infiltrative change in the multiple lobe of the right lung are likely infectious etiology, other possibilities are less likely but still possible including pneumonitis from [name] or tumor progression.  -I agree with broad antibiotics and supportive care for now. Her white count is training down since IV antibiotics started.  -I'll probably hold her [name] treatment for at least 2 cycles  -will repeat her CXR in 2-4 weeks, if not improving, will consider prednisone  -will repeat CT chest in 4-8 weeks to see if the infiltrates resolves.  -Due to her underline moderate dementia, patient would not be a candidate for further treatment if she has melanoma progression. Patient and her family are agreeable with the overall palliative approach.  -I will follow her in house as need. Will see her in my office in 1-2 weeks after discharge.   I spoke with Dr. Algis Liming this morning. I discussed the above with pt's daughter in detail.   Burr Medico,  Krista Blue, MD 01/30/2015  5:56 PM

## 2015-01-30 NOTE — ED Notes (Signed)
Pt transferred to [facility] via [employer]

## 2015-01-31 DIAGNOSIS — J690 Pneumonitis due to inhalation of food and vomit: Principal | ICD-10-CM

## 2015-01-31 LAB — BASIC METABOLIC PANEL
Anion gap: 6 (ref 5–15)
BUN: 17 mg/dL (ref 6–20)
CALCIUM: 8.6 mg/dL — AB (ref 8.9–10.3)
CO2: 26 mmol/L (ref 22–32)
Chloride: 110 mmol/L (ref 101–111)
Creatinine, Ser: 0.99 mg/dL (ref 0.44–1.00)
GFR calc Af Amer: >60 mL/min (ref >60)
GFR, EST NON AFRICAN AMERICAN: 52 mL/min — AB (ref >60)
GLUCOSE: 86 mg/dL (ref 65–99)
POTASSIUM: 3.9 mmol/L (ref 3.5–5.1)
Sodium: 142 mmol/L (ref 135–145)

## 2015-01-31 LAB — URINE CULTURE: CULTURE: NO GROWTH

## 2015-01-31 NOTE — Progress Notes (Signed)
PROGRESS NOTE    Alexa Hardin MVH:[id number] DOB: 09-09-1932 DOA: 01/29/2015 PCP: Marylene Land, MD  Primary Oncologist: Dr. Truitt Merle  HPI/Brief narrative 82 year old female with history of dementia, metastatic melanoma on immunotherapy, HTN, HLD, breast cancer, was brought to the [facility] ED on 01/29/15 with complaints of persistent cough. Symptoms started 5 days PTA and treated by PCP with a course of Z-Pak despite which she continued to have cough. Family contacted PCP on 1/10 and was advised to come to the ED for further evaluation. In the ED, chest x-ray showed new opacities concerning for metastatic lung lesions, lab work showed acute kidney injury. Admitted for further evaluation and management.   Assessment/Plan:   Possible aspiration pneumonia versus community-acquired pneumonia (right lung, all lobes) - Treated outpatient with Z-Pak for? Acute bronchitis without significant improvement. - Chest x-ray in ED showed new suspicious pulmonary opacities and could not rule out melanoma metastatic system the lungs. - After discussing with memory oncologist, obtained CT chest without contrast which confirmed right lung infiltrates consistent with pneumonia. - Continue IV Zosyn - Speech therapy recommends a regular diet and thin liquids. - Add Tessalon pearls for persistent hacking dry cough. - Will need follow-up imaging, possibly CT chest in 3-4 weeks to ensure clearance of pneumonia findings. - Improving.  No UTI - Urine culture negative. No antibiotics for this indication.  Dehydration - Due to poor oral intake related to cough and pneumonia. IV fluids. Resolved.  Acute kidney injury - Secondary to dehydration. IV fluids. Hold ARB for now. Resolved.  Essential hypertension - Hold ARB for now secondary to acute kidney injury. Continue atenolol, Procardia XL & when necessary IV hydralazine. - Controlled.  Moderate dementia - Continue Aricept. No agitation. - Mental status at  baseline as per spouse.  Hypothyroid - Synthroid.  Metastatic melanoma to lung, abdominal wall, subcutaneous soft tissue and lymph nodes - Currently on immunotherapy. PET scan 11/15/14 showed no evidence of active disease - Discussed with Dr. Sibyl Parr chest CT shows worsening metastasis, patient said to be doing reasonably well and may hold off on palliative care consultation. - Oncologist input 1/11 appreciated.  Breast cancer - Status post bilateral mastectomies - Continue adjuvant Femara  Anemia - Stable  DVT prophylaxis: Lovenox Code Status: DO NOT RESUSCITATE Family Communication: Discussed with spouse at bedside on 1/12. Discussed extensively with patient's daughter via phone on 1/11. Disposition Plan: DC home when medically stable, possibly in 2-3 days.   Consultants:  Oncology  Procedures:  None  Antimicrobials:  IV cefepime 1 dose  IV Zosyn 1/10 >   Subjective: Pleasantly confused. As per spouse at bedside, cough has significantly decreased. No new complaints reported.  Objective: Filed Vitals:   01/30/15 1518 01/30/15 2217 01/31/15 0513 01/31/15 1313  BP:   121/63 131/51  Pulse: 68 67 75 72  Temp:  98.6 F (37 C) 97.9 F (36.6 C) 97.5 F (36.4 C)  TempSrc:  Oral Oral Oral  Resp: '18 18 18 18  '$ Height:      Weight:      SpO2: 95% 97% 96% 93%    Intake/Output Summary (Last 24 hours) at 01/31/15 1514 Last data filed at 01/31/15 1427  Gross per 24 hour  Intake 1034.17 ml  Output   2300 ml  Net -1265.83 ml   Filed Weights   01/29/15 1942 01/30/15 0229  Weight: 54.432 kg (120 lb) 56.3 kg (124 lb 1.9 oz)    Exam:  General exam: Elderly frail female sitting up  comfortably in bed seen eating breakfast by herself. Respiratory system: Improved breath sounds bilaterally. Occasional right basal crackles. Rest of lung sounds clear to auscultation without wheezing or rhonchi. No increased work of breathing. Cardiovascular system: S1 & S2 heard,  RRR. No JVD, murmurs, gallops, clicks or pedal edema. Gastrointestinal system: Abdomen is nondistended, soft and nontender. Normal bowel sounds heard. Central nervous system: Alert and oriented to self and partly to place. No focal neurological deficits. Extremities: Symmetric 5 x 5 power.   Data Reviewed: Basic Metabolic Panel:  Recent Labs Lab 01/29/15 2225 01/30/15 0625 01/31/15 0457  NA 136 140 142  K 4.0 3.8 3.9  CL 105 106 110  CO2 '22 23 26  '$ GLUCOSE 116* 96 86  BUN 45* 32* 17  CREATININE 1.73* 1.19* 0.99  CALCIUM 8.9 8.9 8.6*   Liver Function Tests:  Recent Labs Lab 01/29/15 2225 01/30/15 0625  AST 22 18  ALT 12* 12*  ALKPHOS 55 50  BILITOT 0.6 0.5  PROT 7.8 6.6  ALBUMIN 3.7 2.9*   No results for input(s): LIPASE, AMYLASE in the last 168 hours. No results for input(s): AMMONIA in the last 168 hours. CBC:  Recent Labs Lab 01/29/15 2225 01/30/15 0625  WBC 15.4* 12.0*  NEUTROABS 12.0* 9.3*  HGB 11.7* 11.3*  HCT 37.9 35.8*  MCV 85.6 84.0  PLT 328 307   Cardiac Enzymes: No results for input(s): CKTOTAL, CKMB, CKMBINDEX, TROPONINI in the last 168 hours. BNP (last 3 results) No results for input(s): PROBNP in the last 8760 hours. CBG: No results for input(s): GLUCAP in the last 168 hours.  Recent Results (from the past 240 hour(s))  Urine culture     Status: None   Collection Time: 01/29/15 11:30 PM  Result Value Ref Range Status   Specimen Description URINE, CLEAN CATCH  Final   Special Requests NONE  Final   Culture   Final    NO GROWTH 1 DAY Performed at [facility]    Report Status 01/31/2015 FINAL  Final         Studies: Dg Chest 2 View  01/29/2015  CLINICAL DATA:  Fever.  Fatigued. EXAM: CHEST  2 VIEW COMPARISON:  11/16/2014 FINDINGS: There is a right chest wall port a catheter with tip in the cavoatrial junction. The lungs are hyperinflated. There is no pleural effusion or edema. Several suspicious opacities are identified in  both lungs which appear new from previous exam index lesion within the right base measures 3.1 cm. Cannot rule out pulmonary metastasis. IMPRESSION: 1. New suspicious pulmonary opacities identified. Cannot rule out melanoma metastasis to the lungs. Electronically Signed   By: Kerby Moors M.D.   On: 01/29/2015 20:23   Ct Chest Wo Contrast  01/30/2015  CLINICAL DATA:  Persistent cough EXAM: CT CHEST WITHOUT CONTRAST TECHNIQUE: Multidetector CT imaging of the chest was performed following the standard protocol without IV contrast. COMPARISON:  11/16/2014, 01/29/2015 FINDINGS: The lungs are well aerated bilaterally. Stable changes are noted in the anterior aspect of the right upper lobe when compared with the prior PET-CT examination. New areas of patchy infiltrate are noted in the lateral aspect of the right middle lobe as well as the anterior aspect of the right lower lobe. These are consistent with acute pneumonic infiltrates. Minimal infiltrative changes are also noted in the inferior aspect of the right upper lobe. Scarring is again noted in the medial aspect of the right middle lobe and lingula which is stable. A new small  right-sided pleural effusion is noted. Aortic calcifications are seen without aneurysmal dilatation. A right chest wall port is seen. No significant hilar or mediastinal adenopathy is noted. The upper abdomen reveals no acute abnormality. The osseous structures show mild degenerative change without acute abnormality. IMPRESSION: New areas of patchy infiltrate within the right lung as described. Followup PA and lateral chest X-ray is recommended in 3-4 weeks following trial of antibiotic therapy to ensure resolution and exclude underlying malignancy. No other focal abnormality is noted. These results will be called to the ordering clinician or representative by the Radiologist Assistant, and communication documented in the PACS or zVision Dashboard. Electronically Signed   By: Inez Catalina  M.D.   On: 01/30/2015 10:07        Scheduled Meds: . aspirin EC  81 mg Oral Daily  . atenolol  50 mg Oral Daily  . benzonatate  200 mg Oral TID  . cholecalciferol  1,000 Units Oral Daily  . donepezil  10 mg Oral QHS  . enoxaparin (LOVENOX) injection  30 mg Subcutaneous Daily  . letrozole  2.5 mg Oral Daily  . levothyroxine  50 mcg Oral QAC breakfast  . megestrol  800 mg Oral Daily  . NIFEdipine  30 mg Oral Daily  . piperacillin-tazobactam (ZOSYN)  IV  3.375 g Intravenous 3 times per day  . simvastatin  40 mg Oral Daily   Continuous Infusions:    Principal Problem:   Aspiration pneumonia (HCC) Active Problems:   Malignant melanoma of unknown origin ([place])   Alzheimer's disease   Acute kidney injury ([place])   Productive cough   Hypothyroidism    Time spent: 20 minutes.    Vernell Leep, MD, FACP, FHM. [facility] Pager [phone]  If 7PM-7AM, please contact night-coverage [email or web address] Password [email or web address] 01/31/2015, 3:14 PM    LOS: 1 day

## 2015-01-31 NOTE — Evaluation (Signed)
Physical Therapy Evaluation Patient Details Name: Alexa Hardin MRN: [id number] DOB: Jun 30, 1932 Today's Date: 01/31/2015   History of Present Illness  Patient is an 82 yo female admitted 01/29/15 with aspiration pna.  PMH:  dementia, metastatic melanoma, HTN, HLD, breast CA  Clinical Impression  Patient presents with problems listed below.  Will benefit from acute PT to maximize strength and mobility prior to discharge home with family.  Patient requires 24/7 supervision/assist.  Caregiver can assist patient with gait for safety. No discharge PT needs identified.    Follow Up Recommendations No PT follow up;Supervision/Assistance - 24 hour    Equipment Recommendations  None recommended by PT    Recommendations for Other Services       Precautions / Restrictions Precautions Precautions: Fall Restrictions Weight Bearing Restrictions: No      Mobility  Bed Mobility Overal bed mobility: Needs Assistance Bed Mobility: Supine to Sit;Sit to Supine     Supine to sit: Supervision Sit to supine: Supervision   General bed mobility comments: Supervision for safety.  Instructed patient to sit at EOB - verbal and visual cues.  Patient unable to follow command.  Eventually did move to EOB on opposite side of bed.  Transfers Overall transfer level: Needs assistance Equipment used: None Transfers: Sit to/from Stand Sit to Stand: Min guard         General transfer comment: Assist for safety/balance.  Ambulation/Gait Ambulation/Gait assistance: Min assist;Mod assist Ambulation Distance (Feet): 82 Feet Assistive device: None Gait Pattern/deviations: Step-through pattern;Decreased step length - right;Decreased step length - left;Decreased stride length;Shuffle;Staggering left;Staggering right Gait velocity: decreased Gait velocity interpretation: Below normal speed for age/gender General Gait Details: Multiple episodes of loss of balance, requiring mod assist to prevent  fall.  Stairs            Wheelchair Mobility    Modified Rankin (Stroke Patients Only)       Balance Overall balance assessment: Needs assistance         Standing balance support: No upper extremity supported Standing balance-Leahy Scale: Fair Standing balance comment: Able to maintain static balance.  Loss of balance occurs with dynamic activities/gait.                             Pertinent Vitals/Pain Pain Assessment: No/denies pain    Home Living Family/patient expects to be discharged to:: Private residence Living Arrangements: Spouse/significant other;Children;Other relatives (Husband, dtr, son-in-law, 2 grandchildren) Available Help at Discharge: Family;Available 24 hours/day Type of Home: House Home Access: Level entry     Home Layout: Two level (Patient and husband in basement apartment) Home Equipment: None Additional Comments: Family member is always with patient per husband    Prior Function Level of Independence: Needs assistance   Gait / Transfers Assistance Needed: Ambulates with supervision around home.  ADL's / Homemaking Assistance Needed: Assist with bathing, dressing, meal prep        Hand Dominance        Extremity/Trunk Assessment   Upper Extremity Assessment: Generalized weakness           Lower Extremity Assessment: Generalized weakness         Communication   Communication: Expressive difficulties (?related to dementia)  Cognition Arousal/Alertness: Awake/alert Behavior During Therapy: Restless;Anxious;Impulsive Overall Cognitive Status: History of cognitive impairments - at baseline Area of Impairment: Orientation;Memory;Following commands;Safety/judgement;Awareness;Problem solving Orientation Level: Disoriented to;Place;Situation;Time   Memory: Decreased short-term memory Following Commands: Follows one step commands inconsistently;Follows one  step commands with increased time Safety/Judgement:  Decreased awareness of deficits;Decreased awareness of safety   Problem Solving: Slow processing;Difficulty sequencing;Requires verbal cues      General Comments      Exercises        Assessment/Plan    PT Assessment Patient needs continued PT services  PT Diagnosis Abnormality of gait;Generalized weakness;Altered mental status   PT Problem List Decreased strength;Decreased activity tolerance;Decreased balance;Decreased mobility;Decreased cognition;Decreased safety awareness  PT Treatment Interventions Gait training;Functional mobility training;Therapeutic activities;Therapeutic exercise;Patient/family education   PT Goals (Current goals can be found in the Care Plan section) Acute Rehab PT Goals Patient Stated Goal: Unable to state.  Husband - Patient to return home PT Goal Formulation: With patient/family Time For Goal Achievement: 02/07/15 Potential to Achieve Goals: Fair    Frequency Min 3X/week   Barriers to discharge        Co-evaluation               End of Session Equipment Utilized During Treatment: Gait belt Activity Tolerance: Patient tolerated treatment well Patient left: in bed;with call bell/phone within reach;with family/visitor present Nurse Communication: Mobility status         Time: 1655-1730 PT Time Calculation (min) (ACUTE ONLY): 35 min   Charges:   PT Evaluation $PT Eval Moderate Complexity: 1 Procedure PT Treatments $Gait Training: 8-22 mins   PT G CodesDespina Pole 02-11-15, 5:54 PM Carita Pian. Sanjuana Kava, [place] Pager [phone]

## 2015-01-31 NOTE — Progress Notes (Signed)
Speech Language Pathology Treatment: Dysphagia  Patient Details Name: Alexa Hardin MRN: [id number] DOB: 24-Jul-1932 Today's Date: 01/31/2015 Time: 8466-5993 SLP Time Calculation (min) (ACUTE ONLY): 17 min  Assessment / Plan / Recommendation Clinical Impression  Pt with increased sustained attention for better PO intake this morning as compared to previous date. Intake with thin liquids via straw resulted in wet vocal quality x1, which pt cleared with a spontaneous throat clear. No further signs of aspiration noted across self-fed trials. Husband present also educated on dysphagia that can occur secondary to dementia and signs concerning for aspiration. Will continue to follow briefly as she remains in house.   HPI HPI: 82 year old female with history of dementia, metastatic melanoma on immunotherapy, HTN, HLD, breast cancer, was brought to the [facility] ED on 01/29/15 with complaints of persistent cough. CXR shows opacities concerning for metastatic lung lesions versus PNA.      SLP Plan  Continue with current plan of care     Recommendations  Diet recommendations: Regular;Thin liquid Liquids provided via: Cup;Straw Medication Administration: Whole meds with liquid Supervision: Patient able to self feed;Full supervision/cueing for compensatory strategies Compensations: Slow rate;Small sips/bites;Minimize environmental distractions Postural Changes and/or Swallow Maneuvers: Seated upright 90 degrees              Oral Care Recommendations: Oral care BID Follow up Recommendations: None Plan: Continue with current plan of care   Germain Osgood, M.A. CCC-SLP [phone]  Germain Osgood 01/31/2015, 11:09 AM

## 2015-02-01 ENCOUNTER — Other Ambulatory Visit: Payer: Self-pay | Admitting: Hematology

## 2015-02-01 DIAGNOSIS — C439 Malignant melanoma of skin, unspecified: Secondary | ICD-10-CM

## 2015-02-01 MED ORDER — AMOXICILLIN-POT CLAVULANATE 500-125 MG PO TABS: 1.0000 | ORAL_TABLET | Freq: Two times a day (BID) | ORAL | Status: DC

## 2015-02-01 MED ORDER — BENZONATATE 200 MG PO CAPS: 200.0000 mg | ORAL_CAPSULE | Freq: Three times a day (TID) | ORAL | Status: DC | PRN

## 2015-02-01 MED ORDER — AMOXICILLIN-POT CLAVULANATE 500-125 MG PO TABS
1.0000 | ORAL_TABLET | Freq: Two times a day (BID) | ORAL | Status: DC
  Filled 2015-02-01 (×2): qty 1

## 2015-02-01 NOTE — Care Management Note (Signed)
Case Management Note  Patient Details  Name: Shakevia Sarris MRN: [id number] Date of Birth: 01/28/1932  Subjective/Objective:                    Action/Plan:  Spoke with patient and husband at bedside. Husband states patient has 24 hour supervision / assistance at mainly from him , daughter also helps. Provided Private Duty Sitter list.  Expected Discharge Date:                  Expected Discharge Plan:  Home/Self Care  In-House Referral:     Discharge planning Services     Post Acute Care Choice:    Choice offered to:  Patient, Spouse  DME Arranged:    DME Agency:     HH Arranged:    [place] Agency:     Status of Service:  Completed, signed off  Medicare Important Message Given:    Date Medicare IM Given:    Medicare IM give by:    Date Additional Medicare IM Given:    Additional Medicare Important Message give by:     If discussed at [place] of Stay Meetings, dates discussed:    Additional Comments:  Marilu Favre, RN 02/01/2015, 12:03 PM

## 2015-02-01 NOTE — Care Management Note (Signed)
Case Management Note  Patient Details  Name: Alexa Hardin MRN: [id number] Date of Birth: [birth year]  Subjective/Objective:                    Action/Plan:  Patient's daughter Nelson Chimes , at bedside with patient and patient's husband . [name] requesting home health PT and aide and wheelchair , walker and 3 in 1  . Explained PT did not recommend home health PT or any equipment and insurance may not cover . [name] voiced understanding and still wants same.   Received orders for same with understanding insurance may not cover .    Expected Discharge Date:                  Expected Discharge Plan:  [place]  In-House Referral:     Discharge planning Services  CM Consult  Post Acute Care Choice:    Choice offered to:  Patient, Spouse, Adult Children  DME Arranged:  Walker rolling DME Agency:  [place]:  PT, Nurse's Aide [place] Agency:  [place]  Status of Service:  Completed, signed off  Medicare Important Message Given:    Date Medicare IM Given:    Medicare IM give by:    Date Additional Medicare IM Given:    Additional Medicare Important Message give by:     If discussed at [place] of Stay Meetings, dates discussed:    Additional Comments:  Marilu Favre, RN 02/01/2015, 2:05 PM

## 2015-02-01 NOTE — Care Management Important Message (Signed)
Important Message  Patient Details  Name: Alexa Hardin MRN: [id number] Date of Birth: [birth year]   Medicare Important Message Given:  Yes    Jamie-Lee Galdamez P Dera Vanaken 02/01/2015, 3:25 PM

## 2015-02-01 NOTE — Discharge Summary (Signed)
Physician Discharge Summary  Alexa Hardin MEQ:[id number] DOB: May 10, 1932 DOA: 01/29/2015  PCP: Marylene Land, MD  Admit date: 01/29/2015 Discharge date: 02/01/2015  Time spent: Greater than 30 minutes  Recommendations for Outpatient Follow-up:  1. Dr. Derinda Late, PCP in 5 days with repeat labs (CBC & BMP). 2. Dr. Truitt Merle, Oncology in 1 week. 3. Rolling walker, 3 n 1, wheelchair, home health PT and aide  Discharge Diagnoses:  Principal Problem:   Aspiration pneumonia ([place]) Active Problems:   Malignant melanoma of unknown origin ([place])   Alzheimer's disease   Acute kidney injury ([employer])   Productive cough   Hypothyroidism   Discharge Condition: Improved & Stable  Diet recommendation: Heart healthy diet.  Filed Weights   01/29/15 1942 01/30/15 0229  Weight: 54.432 kg (120 lb) 56.3 kg (124 lb 1.9 oz)    History of present illness:  82 year old female with history of dementia, metastatic melanoma on immunotherapy, HTN, HLD, breast cancer, was brought to the [facility] ED on 01/29/15 with complaints of persistent cough. Symptoms started 5 days PTA and treated by PCP with a course of Z-Pak despite which she continued to have cough. Family contacted PCP on 1/10 and was advised to come to the ED for further evaluation. In the ED, chest x-ray showed new opacities concerning for metastatic lung lesions, lab work showed acute kidney injury. Admitted for further evaluation and management.  Hospital Course:   Possible aspiration pneumonia versus community-acquired pneumonia (right lung, all lobes) - Treated outpatient with Z-Pak for? Acute bronchitis without significant improvement. - Chest x-ray in ED showed new suspicious pulmonary opacities and could not rule out melanoma metastatic to the lungs. - After discussing with her primary oncologist, obtained CT chest without contrast which confirmed right lung infiltrates consistent with pneumonia. - Treated with IV Zosyn - Speech therapy  recommends a regular diet and thin liquids. - Added Tessalon pearls for persistent hacking dry cough- significantly improved per spouse - Will need follow-up imaging, possibly CT chest in 3-4 weeks to ensure clearance of pneumonia findings. - Clinically improved. Transitioned to oral Augmentin at discharge and complete total 7 days course.  No UTI - Urine culture negative. No antibiotics for this indication.  Dehydration - Due to poor oral intake related to cough and pneumonia. IV fluids. Resolved.  Acute kidney injury - Secondary to dehydration. IV fluids. Hold ARB for now. Resolved. Her oral intake is likely to fluctuate as outpatient related to her dementia. Thereby holding ARB for now and can reassess as outpatient regarding resuming it.  Essential hypertension - Hold ARB for now secondary to recent acute kidney injury. Continue atenolol, Procardia XL. - Controlled.  Moderate dementia - Continue Aricept. No agitation. - Mental status at baseline as per spouse.  Hypothyroid - Synthroid. Patient's TSH is elevated at 7.235 range. Suspect secondary to inconsistency/poor compliance to medication. Clinically euthyroid. Consider repeating TSH in 4-6 weeks versus marginally increasing dose to 62.5 g daily as outpatient. We'll defer this decision to her PCP who knows her best.  Metastatic melanoma to lung, abdominal wall, subcutaneous soft tissue and lymph nodes - Currently on immunotherapy. PET scan 11/15/14 showed no evidence of active disease - Discussed with Dr. Sibyl Parr chest CT shows worsening metastasis, patient said to be doing reasonably well and may hold off on palliative care consultation. - Oncologist input 1/11 appreciated. Plans to follow up as outpatient in 1-2 weeks after discharge.  Breast cancer - Status post bilateral mastectomies - Continue adjuvant Femara  Anemia -  Stable  Discussed extensively with spouse at  bedside.   Consultants:  Oncology  Procedures:  None   Discharge Exam:  Complaints:  Pleasantly confused. As per spouse, cough has significantly improved. No dyspnea reported. Eating okay. No acute issues reported.  Filed Vitals:   01/31/15 1313 01/31/15 2113 02/01/15 0637 02/01/15 0831  BP: 131/51 137/57 101/52 146/69  Pulse: 72 70 63 83  Temp: 97.5 F (36.4 C) 99 F (37.2 C) 97.9 F (36.6 C)   TempSrc: Oral Oral Axillary   Resp: '18 17 18   '$ Height:      Weight:      SpO2: 93% 95% 95%     General exam: Elderly frail female sitting up comfortably in bed. Respiratory system: Improved breath sounds bilaterally. Occasional right basal crackles. Rest of lung sounds clear to auscultation without wheezing or rhonchi. No increased work of breathing. Cardiovascular system: S1 & S2 heard, RRR. No JVD, murmurs, gallops, clicks or pedal edema. Gastrointestinal system: Abdomen is nondistended, soft and nontender. Normal bowel sounds heard. Central nervous system: Alert and oriented to self. No focal neurological deficits. Extremities: Symmetric 5 x 5 power.  Discharge Instructions      Discharge Instructions    Call MD for:  difficulty breathing, headache or visual disturbances    Complete by:  As directed      Call MD for:  extreme fatigue    Complete by:  As directed      Call MD for:  persistant dizziness or light-headedness    Complete by:  As directed      Call MD for:  persistant nausea and vomiting    Complete by:  As directed      Call MD for:  severe uncontrolled pain    Complete by:  As directed      Call MD for:  temperature >100.4    Complete by:  As directed      Diet - low sodium heart healthy    Complete by:  As directed      Increase activity slowly    Complete by:  As directed             Medication List    STOP taking these medications        losartan 100 MG tablet  Commonly known as:  COZAAR     potassium chloride SA 20 MEQ tablet   Commonly known as:  K-DUR,KLOR-CON      TAKE these medications        amoxicillin-clavulanate 500-125 MG tablet  Commonly known as:  AUGMENTIN  Take 1 tablet (500 mg total) by mouth 2 (two) times daily.     aspirin 81 MG tablet  Take 81 mg by mouth daily.     atenolol 50 MG tablet  Commonly known as:  TENORMIN  Take 50 mg by mouth daily.     benzonatate 200 MG capsule  Commonly known as:  TESSALON  Take 1 capsule (200 mg total) by mouth 3 (three) times daily as needed for cough.     donepezil 10 MG tablet  Commonly known as:  ARICEPT  Take 1 tablet (10 mg total) by mouth at bedtime.     letrozole 2.5 MG tablet  Commonly known as:  FEMARA  Take 1 tablet (2.5 mg total) by mouth daily.     levothyroxine 50 MCG tablet  Commonly known as:  SYNTHROID, LEVOTHROID  Take 50 mcg by mouth daily before breakfast.  megestrol 625 MG/5ML suspension  Commonly known as:  MEGACE ES  Take 5 mLs (625 mg total) by mouth daily.     NIFEDICAL XL 30 MG 24 hr tablet  Generic drug:  NIFEdipine  TK 1 T PO D     simvastatin 40 MG tablet  Commonly known as:  ZOCOR  Take 40 mg by mouth daily.     Vitamin D3 1000 units Caps  Take 1 capsule by mouth daily.       Follow-up Information    Follow up with Marylene Land, MD. Schedule an appointment as soon as possible for a visit in 5 days.   Specialty:  Family Medicine   Why:  To be seen with repeat labs (CBC & BMP).   Contact information:   [place] [address] [phone]       Follow up with Truitt Merle, MD. Schedule an appointment as soon as possible for a visit in 1 week.   Specialties:  Hematology, Oncology   Contact information:   [place] [address] [phone]        The results of significant diagnostics from this hospitalization (including imaging, microbiology, ancillary and laboratory) are listed below for reference.    Significant Diagnostic Studies: Dg Chest 2  View  01/29/2015  CLINICAL DATA:  Fever.  Fatigued. EXAM: CHEST  2 VIEW COMPARISON:  11/16/2014 FINDINGS: There is a right chest wall port a catheter with tip in the cavoatrial junction. The lungs are hyperinflated. There is no pleural effusion or edema. Several suspicious opacities are identified in both lungs which appear new from previous exam index lesion within the right base measures 3.1 cm. Cannot rule out pulmonary metastasis. IMPRESSION: 1. New suspicious pulmonary opacities identified. Cannot rule out melanoma metastasis to the lungs. Electronically Signed   By: Kerby Moors M.D.   On: 01/29/2015 20:23   Ct Chest Wo Contrast  01/30/2015  CLINICAL DATA:  Persistent cough EXAM: CT CHEST WITHOUT CONTRAST TECHNIQUE: Multidetector CT imaging of the chest was performed following the standard protocol without IV contrast. COMPARISON:  11/16/2014, 01/29/2015 FINDINGS: The lungs are well aerated bilaterally. Stable changes are noted in the anterior aspect of the right upper lobe when compared with the prior PET-CT examination. New areas of patchy infiltrate are noted in the lateral aspect of the right middle lobe as well as the anterior aspect of the right lower lobe. These are consistent with acute pneumonic infiltrates. Minimal infiltrative changes are also noted in the inferior aspect of the right upper lobe. Scarring is again noted in the medial aspect of the right middle lobe and lingula which is stable. A new small right-sided pleural effusion is noted. Aortic calcifications are seen without aneurysmal dilatation. A right chest wall port is seen. No significant hilar or mediastinal adenopathy is noted. The upper abdomen reveals no acute abnormality. The osseous structures show mild degenerative change without acute abnormality. IMPRESSION: New areas of patchy infiltrate within the right lung as described. Followup PA and lateral chest X-ray is recommended in 3-4 weeks following trial of antibiotic  therapy to ensure resolution and exclude underlying malignancy. No other focal abnormality is noted. These results will be called to the ordering clinician or representative by the Radiologist Assistant, and communication documented in the PACS or zVision Dashboard. Electronically Signed   By: Inez Catalina M.D.   On: 01/30/2015 10:07    Microbiology: Recent Results (from the past 240 hour(s))  Urine culture  Status: None   Collection Time: 01/29/15 11:30 PM  Result Value Ref Range Status   Specimen Description URINE, CLEAN CATCH  Final   Special Requests NONE  Final   Culture   Final    NO GROWTH 1 DAY Performed at [facility]    Report Status 01/31/2015 FINAL  Final     Labs: Basic Metabolic Panel:  Recent Labs Lab 01/29/15 2225 01/30/15 0625 01/31/15 0457  NA 136 140 142  K 4.0 3.8 3.9  CL 105 106 110  CO2 '22 23 26  '$ GLUCOSE 116* 96 86  BUN 45* 32* 17  CREATININE 1.73* 1.19* 0.99  CALCIUM 8.9 8.9 8.6*   Liver Function Tests:  Recent Labs Lab 01/29/15 2225 01/30/15 0625  AST 22 18  ALT 12* 12*  ALKPHOS 55 50  BILITOT 0.6 0.5  PROT 7.8 6.6  ALBUMIN 3.7 2.9*   No results for input(s): LIPASE, AMYLASE in the last 168 hours. No results for input(s): AMMONIA in the last 168 hours. CBC:  Recent Labs Lab 01/29/15 2225 01/30/15 0625  WBC 15.4* 12.0*  NEUTROABS 12.0* 9.3*  HGB 11.7* 11.3*  HCT 37.9 35.8*  MCV 85.6 84.0  PLT 328 307   Cardiac Enzymes: No results for input(s): CKTOTAL, CKMB, CKMBINDEX, TROPONINI in the last 168 hours. BNP: BNP (last 3 results) No results for input(s): BNP in the last 8760 hours.  ProBNP (last 3 results) No results for input(s): PROBNP in the last 8760 hours.  CBG: No results for input(s): GLUCAP in the last 168 hours.     Signed:  Vernell Leep, MD, FACP, FHM. [facility] Pager [phone]  If 7PM-7AM, please contact night-coverage [email or web address] Password [email or web address] 02/01/2015, 2:51 PM

## 2015-02-01 NOTE — Discharge Instructions (Signed)
Aspiration Pneumonia °Aspiration pneumonia is an infection in your lungs. It occurs when food, liquid, or stomach contents (vomit) are inhaled (aspirated) into your lungs. When these things get into your lungs, swelling (inflammation) and infection can occur. This can make it difficult for you to breathe. Aspiration pneumonia is a serious condition and can be life threatening. °RISK FACTORS °Aspiration pneumonia is more likely to occur when a person's cough (gag) reflex or ability to swallow has been decreased. Some things that can do this include:  °· Having a brain injury or disease, such as stroke, seizures, Parkinson's disease, dementia, or amyotrophic lateral sclerosis (ALS).   °· Being given general anesthetic for procedures.   °· Being in a coma (unconscious).   °· Having a narrowing of the tube that carries food to the stomach (esophagus).   °· Drinking too much alcohol. If a person passes out and vomits, vomit can be swallowed into the lungs.   °· Taking certain medicines, such as tranquilizers or sedatives.   °SIGNS AND SYMPTOMS  °· Coughing after swallowing food or liquids.   °· Breathing problems, such as wheezing or shortness of breath.   °· Bluish skin. This can be caused by lack of oxygen.   °· Coughing up food or mucus. The mucus might contain blood, greenish material, or yellowish-white fluid (pus).   °· Fever.   °· Chest pain.   °· Being more tired than usual (fatigue).   °· Sweating more than usual.   °· Bad breath.   °DIAGNOSIS  °A physical exam will be done. During the exam, the health care provider will listen to your lungs with a stethoscope to check for:  °· Crackling sounds in the lungs. °· Decreased breath sounds. °· A rapid heartbeat. °Various tests may be ordered. These may include:  °· Chest X-ray.   °· CT scan.   °· Swallowing study. This test looks at how food is swallowed and whether it goes into your breathing tube (trachea) or food pipe (esophagus).   °· Sputum culture. Saliva and  mucus (sputum) are collected from the lungs or the tubes that carry air to the lungs (bronchi). The sputum is then tested for bacteria.   °· Bronchoscopy. This test uses a flexible tube (bronchoscope) to see inside the lungs. °TREATMENT  °Treatment will usually include antibiotic medicines. Other medicines may also be used to reduce fever or pain. You may need to be treated in the hospital. In the hospital, your breathing will be carefully monitored. Depending on how well you are breathing, you may need to be given oxygen, or you may need breathing support from a breathing machine (ventilator). For people who fail a swallowing study, a feeding tube might be placed in the stomach, or they may be asked to avoid certain food textures or liquids when they eat. °HOME CARE INSTRUCTIONS  °· Carefully follow any special eating instructions you were given, such as avoiding certain food textures or thickening liquids. This reduces the risk of developing aspiration pneumonia again.  °· Only take over-the-counter or prescription medicines as directed by your health care provider. Follow the directions carefully.   °· If you were prescribed antibiotics, take them as directed. Finish them even if you start to feel better.   °· Rest as instructed by your health care provider.   °· Keep all follow-up appointments with your health care provider.   °SEEK MEDICAL CARE IF:  °· You develop worsening shortness of breath, wheezing, or difficulty breathing.   °· You develop a fever.   °· You have chest pain.   °MAKE SURE YOU:  °· Understand these instructions. °· Will watch your   condition.  Will get help right away if you are not doing well or get worse.   This information is not intended to replace advice given to you by your health care provider. Make sure you discuss any questions you have with your health care provider.   Document Released: 11/02/2008 Document Revised: 01/10/2013 Document Reviewed: 06/23/2012 Elsevier  Interactive Patient Education [employer].

## 2015-02-01 NOTE — Progress Notes (Signed)
Pamala Hurry Braxton to be D/C'd Home per MD order.  Discussed with the patient and all questions fully answered.  VSS, Skin clean, dry and intact without evidence of skin break down, no evidence of skin tears noted. IV catheter discontinued intact. Site without signs and symptoms of complications. Dressing and pressure applied.  An After Visit Summary was printed and given to the patient. Patient received prescription.  D/c education completed with patient/family including follow up instructions, medication list, d/c activities limitations if indicated, with other d/c instructions as indicated by MD - patient able to verbalize understanding, all questions fully answered.   Patient instructed to return to ED, call 911, or call MD for any changes in condition.   Patient to be escorted via [place], and D/C home via private auto.  L'ESPERANCE, Matteus Mcnelly C 02/01/2015 3:55 PM ]

## 2015-02-03 ENCOUNTER — Telehealth: Payer: Self-pay | Admitting: Hematology

## 2015-02-03 NOTE — Telephone Encounter (Signed)
Called with appointment cancellation for 02/04/15  Alexa Hardin

## 2015-02-04 ENCOUNTER — Ambulatory Visit: Payer: Medicare HMO

## 2015-02-04 ENCOUNTER — Other Ambulatory Visit: Payer: Medicare HMO

## 2015-02-06 ENCOUNTER — Ambulatory Visit: Payer: Medicare HMO | Admitting: Neurology

## 2015-02-11 ENCOUNTER — Other Ambulatory Visit: Payer: Self-pay | Admitting: Family Medicine

## 2015-02-11 ENCOUNTER — Ambulatory Visit (INDEPENDENT_AMBULATORY_CARE_PROVIDER_SITE_OTHER): Payer: Medicare HMO

## 2015-02-11 DIAGNOSIS — R918 Other nonspecific abnormal finding of lung field: Secondary | ICD-10-CM

## 2015-02-12 ENCOUNTER — Encounter: Payer: Self-pay | Admitting: Hematology

## 2015-02-12 ENCOUNTER — Telehealth: Payer: Self-pay | Admitting: *Deleted

## 2015-02-12 NOTE — Progress Notes (Signed)
Sent prior auth for megestrol

## 2015-02-12 NOTE — Telephone Encounter (Signed)
Spouse called inquiring about "prescription at pharmacy for two weeks now.  What is the [facility] approval status?  She's been on this medicine a few years now."   Advised Prior authorization was received yesterday by [employer].  Received communication from [place] this morning medicine denied but we're continuing to work on this.  "If there's an alternative we're willing to switch."  [employer] notified of this call.

## 2015-02-12 NOTE — Telephone Encounter (Signed)
Sarah RN with [place] certification calling in reference to Megestrol denial.  Call transferred to [place] ext [phone].

## 2015-02-13 NOTE — Telephone Encounter (Signed)
Spouse called inquiring about status of prior authorization. For Megestrol.

## 2015-02-19 ENCOUNTER — Other Ambulatory Visit (HOSPITAL_BASED_OUTPATIENT_CLINIC_OR_DEPARTMENT_OTHER): Payer: Medicare HMO

## 2015-02-19 ENCOUNTER — Ambulatory Visit: Payer: Medicare HMO

## 2015-02-19 ENCOUNTER — Encounter: Payer: Self-pay | Admitting: Hematology

## 2015-02-19 ENCOUNTER — Ambulatory Visit (HOSPITAL_BASED_OUTPATIENT_CLINIC_OR_DEPARTMENT_OTHER): Payer: Medicare HMO | Admitting: Hematology

## 2015-02-19 ENCOUNTER — Ambulatory Visit (HOSPITAL_BASED_OUTPATIENT_CLINIC_OR_DEPARTMENT_OTHER): Payer: Medicare HMO

## 2015-02-19 VITALS — BP 124/52 | HR 62 | Temp 98.0°F | Resp 17 | Ht 69.0 in | Wt 118.4 lb

## 2015-02-19 DIAGNOSIS — Z95828 Presence of other vascular implants and grafts: Secondary | ICD-10-CM

## 2015-02-19 DIAGNOSIS — F0391 Unspecified dementia with behavioral disturbance: Secondary | ICD-10-CM

## 2015-02-19 DIAGNOSIS — C3490 Malignant neoplasm of unspecified part of unspecified bronchus or lung: Secondary | ICD-10-CM

## 2015-02-19 DIAGNOSIS — C439 Malignant melanoma of skin, unspecified: Secondary | ICD-10-CM

## 2015-02-19 DIAGNOSIS — D0502 Lobular carcinoma in situ of left breast: Secondary | ICD-10-CM

## 2015-02-19 DIAGNOSIS — C799 Secondary malignant neoplasm of unspecified site: Secondary | ICD-10-CM

## 2015-02-19 DIAGNOSIS — C50912 Malignant neoplasm of unspecified site of left female breast: Secondary | ICD-10-CM

## 2015-02-19 DIAGNOSIS — D0501 Lobular carcinoma in situ of right breast: Secondary | ICD-10-CM | POA: Diagnosis not present

## 2015-02-19 DIAGNOSIS — C50911 Malignant neoplasm of unspecified site of right female breast: Secondary | ICD-10-CM

## 2015-02-19 DIAGNOSIS — F03918 Unspecified dementia, unspecified severity, with other behavioral disturbance: Secondary | ICD-10-CM

## 2015-02-19 DIAGNOSIS — E039 Hypothyroidism, unspecified: Secondary | ICD-10-CM

## 2015-02-19 LAB — CBC WITH DIFFERENTIAL/PLATELET
BASO%: 1.3 % (ref 0.0–2.0)
Basophils Absolute: 0.1 10*3/uL (ref 0.0–0.1)
EOS ABS: 0.1 10*3/uL (ref 0.0–0.5)
EOS%: 1.3 % (ref 0.0–7.0)
HCT: 35.5 % (ref 34.8–46.6)
HGB: 11.5 g/dL — ABNORMAL LOW (ref 11.6–15.9)
LYMPH%: 15.8 % (ref 14.0–49.7)
MCH: 26.2 pg (ref 25.1–34.0)
MCHC: 32.5 g/dL (ref 31.5–36.0)
MCV: 80.6 fL (ref 79.5–101.0)
MONO#: 0.8 10*3/uL (ref 0.1–0.9)
MONO%: 9.1 % (ref 0.0–14.0)
NEUT%: 72.5 % (ref 38.4–76.8)
NEUTROS ABS: 6.5 10*3/uL (ref 1.5–6.5)
Platelets: 320 10*3/uL (ref 145–400)
RBC: 4.41 10*6/uL (ref 3.70–5.45)
RDW: 15.4 % — ABNORMAL HIGH (ref 11.2–14.5)
WBC: 8.9 10*3/uL (ref 3.9–10.3)
lymph#: 1.4 10*3/uL (ref 0.9–3.3)

## 2015-02-19 LAB — COMPREHENSIVE METABOLIC PANEL
ALT: 13 U/L (ref 0–55)
ANION GAP: 10 meq/L (ref 3–11)
AST: 18 U/L (ref 5–34)
Albumin: 3.2 g/dL — ABNORMAL LOW (ref 3.5–5.0)
Alkaline Phosphatase: 50 U/L (ref 40–150)
BILIRUBIN TOTAL: 0.6 mg/dL (ref 0.20–1.20)
BUN: 22.1 mg/dL (ref 7.0–26.0)
CHLORIDE: 107 meq/L (ref 98–109)
CO2: 24 meq/L (ref 22–29)
Calcium: 9.1 mg/dL (ref 8.4–10.4)
Creatinine: 1.1 mg/dL (ref 0.6–1.1)
EGFR: 46 mL/min/{1.73_m2} — AB (ref >90)
GLUCOSE: 83 mg/dL (ref 70–140)
Potassium: 3.5 mEq/L (ref 3.5–5.1)
SODIUM: 141 meq/L (ref 136–145)
TOTAL PROTEIN: 6.9 g/dL (ref 6.4–8.3)

## 2015-02-19 MED ORDER — MIRTAZAPINE 7.5 MG PO TABS: 7.5000 mg | ORAL_TABLET | Freq: Every day | ORAL | Status: DC

## 2015-02-19 MED ORDER — SODIUM CHLORIDE 0.9% FLUSH
10.0000 mL | INTRAVENOUS | Status: DC | PRN
  Administered 2015-02-19: 10 mL via INTRAVENOUS
  Filled 2015-02-19: qty 10

## 2015-02-19 NOTE — Progress Notes (Signed)
Per Hilda Blades approved 01/20/15-02/19/16 pa# [id number],  I will call [facility]

## 2015-02-19 NOTE — Progress Notes (Signed)
[facility]  [phone] [phone]  Clinic follow up Note   Patient Care Team: Derinda Late, MD as PCP - General (Family Medicine) Truitt Merle, MD as Consulting Physician (Hematology) 02/19/2015  CHIEF COMPLAINTS:  Follow up metastatic melanoma  Oncology History   Metastatic melanoma   Staging form: Melanoma of the Skin, AJCC 7th Edition     Clinical: Stage IV ([place], NX, M1b) - Unsigned          Malignant melanoma of unknown origin ([place])   11/03/2011 Imaging PET CT scan showed hypermetabolic nodule in the lingular, 3 cm, consistent with known metastatic disease, hypermetabolic focus in the medial right thigh, without visible lesion on CT. Mild activity in anterior lateral chest wall favoring postsurgical    2013 Initial Diagnosis Metastatic melanoma   10/21/2011 Initial Biopsy Left lung mass biopsy, tumor is present suggests melanoma.   12/02/2011 Surgery Left alone mass which resection, metastatic melanoma, parenchymal margin negative. Lingular mass, negative margins. [place] staining consistent with melanoma.   01/15/2012 Miscellaneous BRAF and c-kit mutation and lysis were negative   06/29/2012 -  Chemotherapy Yervoy, followed by [facility], and currently on Opdivo    11/21/2013 Surgery Resection of metastatic melanoma in right lateral back and right upper arm, past was consistent with melanoma. Margins were negative.   05/17/2014 Imaging Brain MRI showed diffuse cerebral atrophy with superimposed small vessel ischemia disease stable from prior exam. No evidence of intracranial metastasis.   05/17/2014 Imaging CT of the chest, abdomen and pelvis showed no findings to suggest metastatic disease. Decreased in size of perifascial fat of the right flank, previous right pelvic node no longer seen.   11/16/2014 Imaging PET scan showed no conviencing evidence of disese   01/29/2015 - 02/01/2015 Hospital Admission admitted for CAP     Breast cancer, right  breast ([place])   09/26/1999 Initial Diagnosis Right breast lumpectomy showed infiltrating lobular carcinoma, grade 2, T2N1, surgical margins were negative. Laboratory carcinoma in situ.   09/26/1999 Receptors her2 ER 70% positive, PR 10% positive, HER-2 negative.   2001 -  Adjuvant Chemotherapy Adjuvant Adriamycin, Cytoxan and Taxol.   2001 -  Radiation Therapy Adjuvant breast radiation   2002 - 2007 Anti-estrogen oral therapy Tamoxifen for one year, followed by Arimidex which was later on switched to Aromasin due to side effects, for total 5 years.    Breast cancer, left breast ([place])   05/28/2011 Initial Diagnosis Breast cancer, left breast   05/28/2011 Initial Biopsy Left breast at 12:00 position biopsy showed ductal carcinoma in situ high-grade, left breast 3 clock position biopsy showed infiltrating lobular carcinoma.   05/28/2011 Receptors her2 ER positive, PR positive, HER-2 negative. Oncotype recurrence score 13   06/19/2011 Surgery Right breast exertion, no cancer. Sentinel lymph node negative. Left breast exertion showed focal invasive lobular carcinoma 8 mm maximal dimension. Grade 1, DCIS present. Total 41 is negative.   HISTORY OF PRESENTING ILLNESS:  Nico Bisping 82 y.o. female is here to transfer her oncological care to [place] due to the recent relocation. She is accompanied by her husband and daughter to the clinic today.  Please see the summary for her oncological issue above. In summary, she had a stage II of right breast lobular carcinoma in 2001, and stage I left breast lobular carcinoma in 2013, and currently on adjuvant Femara. She was diagnosed with metastatic melanoma to lung, subcutaneous and the lymph nodes in October 2013, status post surgical resection of the lung metastases, and to  subcutaneous lesion. She has been on immunotherapy since then, including YervoyX4, followed by [facility], and currently on [name] Sep 2015. She has been tolerating treatment very well, with only mild  fatigue, no significant other side effects.  She used to live with her husband in [place], was recently moved to [place] nose: To be with her daughter and her family. Her family members have noticed worsening memory loss, some cognitive dysfunction, and some personality change, such as anger control, in the past several months. One day a few weeks ago, she was found wandering in the parking lot outside of her hotel room at 4:00 in the morning. She needs supervision for her daily activities, such as showering, taking medication. Patient denies any of the above issues, but her conversation was disorganized, and had significant short-term memory issue when I tested her.  She has low to moderate appetite ,  on Megace, eats small portion of female, she lost about 20 lbs in the past two years, No significant weight loss lately. She denies any significant pain, dyspnea, abdominal discomfort, or change of her bowel habits. She is able to ambulatory independently without difficulty. She is not very physically active, but able to tolerate daily routine activity.   CURRENT THERAPY: [name] 28m/kg (changed to 2475mon 12/10/2014) every 2 weeks, since 09/2013, held since 01/22/2015 due to pneumonia   [place] [facility], she was found to have right pneumonia, and was treated for with antibiotics. She has been recovering slowly since hospital discharge, still quite fatigued, has mild residual cough and dyspnea on exertion, no fever, chest pain. Her memory loss and intermittent confusion has been getting worse lately, sometime she is combative, and her husband has been given her Ativan as needed.  MEDICAL HISTORY:  Past Medical History  Diagnosis Date  . Hypertension   . [place]    Breast/melanoma  . Dementia     SURGICAL HISTORY: Past Surgical History  Procedure Laterality Date  . Lung removal,  partial Left 2013  . Mastectomy Bilateral 2013  . Abdominal wall mass resection  06/15/2012    SOCIAL HISTORY: Social History   Social History  . Marital Status: Married    Spouse Name: RaDeidre Ala. Number of Children: 1  . Years of Education: 16   Occupational History  . Retired [employer] years)    Social History Main Topics  . Smoking status: Former Smoker -- 1.50 packs/day for 30 years    Quit date: 01/20/1983  . Smokeless tobacco: Not on file  . Alcohol Use: 0.6 oz/week    1 Glasses of wine per week     Comment: every a few days   . Drug Use: No  . Sexual Activity: Not on file   Other Topics Concern  . Not on file   Social History Narrative   Lives at home with husband and daughter/son-in law   Caffeine use: 1 cup per day    FAMILY HISTORY: Family History  Problem Relation Age of Onset  . Cancer Father     lung cancer  . Dementia Neg Hx     ALLERGIES:  is allergic to quinolones.  MEDICATIONS:  Current Outpatient Prescriptions  Medication Sig Dispense Refill  . aspirin 81 MG tablet Take 81 mg by mouth daily.    . Marland Kitchentenolol (TENORMIN) 50 MG tablet Take 50 mg by mouth daily.    .Marland Kitchen  benzonatate (TESSALON) 200 MG capsule Take 1 capsule (200 mg total) by mouth 3 (three) times daily as needed for cough. 20 capsule 0  . Cholecalciferol (VITAMIN D3) 1000 UNITS CAPS Take 1 capsule by mouth daily.    Marland Kitchen donepezil (ARICEPT) 10 MG tablet Take 1 tablet (10 mg total) by mouth at bedtime. 30 tablet 3  . letrozole (FEMARA) 2.5 MG tablet Take 1 tablet (2.5 mg total) by mouth daily. 30 tablet 5  . levothyroxine (SYNTHROID, LEVOTHROID) 50 MCG tablet Take 50 mcg by mouth daily before breakfast.    . LORazepam (ATIVAN) 0.5 MG tablet TK 1/2 TO 1 T PO BID PRA OR AGITATION  5  . megestrol (MEGACE ES) 625 MG/5ML suspension Take 5 mLs (625 mg total) by mouth daily. 150 mL 3  . NIFEDICAL XL 30 MG 24 hr tablet TK 1 T PO D  1  . simvastatin (ZOCOR) 40 MG tablet Take 40 mg by mouth  daily.    . mirtazapine (REMERON) 7.5 MG tablet Take 1 tablet (7.5 mg total) by mouth at bedtime. 30 tablet 0   No current facility-administered medications for this visit.    REVIEW OF SYSTEMS:   Constitutional: Denies fevers, chills or abnormal night sweats Eyes: Denies blurriness of vision, double vision or watery eyes Ears, nose, mouth, throat, and face: Denies mucositis or sore throat Respiratory: Denies cough, dyspnea or wheezes Cardiovascular: Denies palpitation, chest discomfort or lower extremity swelling Gastrointestinal:  Denies nausea, heartburn or change in bowel habits Skin: Denies abnormal skin rashes Lymphatics: Denies new lymphadenopathy or easy bruising Neurological:Denies numbness, tingling or new weaknesses Behavioral/Psych: Mood is stable, no new changes  All other systems were reviewed with the patient and are negative.  PHYSICAL EXAMINATION: ECOG PERFORMANCE STATUS: 2 - Symptomatic, <50% confined to bed  Filed Vitals:   02/19/15 1327  BP: 124/52  Pulse: 62  Temp: 98 F (36.7 C)  Resp: 17   Filed Weights   02/19/15 1327  Weight: 118 lb 6.4 oz (53.706 kg)    GENERAL:alert, no distress and comfortable SKIN: skin color, texture, turgor are normal, no rashes or significant lesions EYES: normal, conjunctiva are pink and non-injected, sclera clear OROPHARYNX:no exudate, no erythema and lips, buccal mucosa, and tongue normal  NECK: supple, thyroid normal size, non-tender, without nodularity LYMPH:  no palpable lymphadenopathy in the cervical, axillary or inguinal LUNGS: clear to auscultation and percussion with normal breathing effort HEART: regular rate & rhythm and no murmurs and no lower extremity edema ABDOMEN:abdomen soft, non-tender and normal bowel sounds Musculoskeletal:no cyanosis of digits and no clubbing  PSYCH: alert & oriented x 3 with fluent speech NEURO: no focal motor/sensory deficits Breasts: s/p bilateral mastectomy. Palpation of the  chest wall and axilla revealed no obvious mass that I could appreciate.   LABORATORY DATA:  I have reviewed the data as listed  CBC Latest Ref Rng 02/19/2015 01/30/2015 01/29/2015  WBC 3.9 - 10.3 10e3/uL 8.9 12.0(H) 15.4(H)  Hemoglobin 11.6 - 15.9 g/dL 11.5(L) 11.3(L) 11.7(L)  Hematocrit 34.8 - 46.6 % 35.5 35.8(L) 37.9  Platelets 145 - 400 10e3/uL 320 307 328    CMP Latest Ref Rng 02/19/2015 01/31/2015 01/30/2015  Glucose 70 - 140 mg/dl 83 86 96  BUN 7.0 - 26.0 mg/dL 22.1 17 32(H)  Creatinine 0.6 - 1.1 mg/dL 1.1 0.99 1.19(H)  Sodium 136 - 145 mEq/L 141 142 140  Potassium 3.5 - 5.1 mEq/L 3.5 3.9 3.8  Chloride 101 - 111 mmol/L - 110 106  CO2 22 - 29 mEq/L 24 26 23   Calcium 8.4 - 10.4 mg/dL 9.1 8.6(L) 8.9  Total Protein 6.4 - 8.3 g/dL 6.9 - 6.6  Total Bilirubin 0.20 - 1.20 mg/dL 0.60 - 0.5  Alkaline Phos 40 - 150 U/L 50 - 50  AST 5 - 34 U/L 18 - 18  ALT 0 - 55 U/L 13 - 12(L)      RADIOGRAPHIC STUDIES: I have personally reviewed the radiological images as listed and agreed with the findings in the report.  CT chest wo contrast 01/30/2015  IMPRESSION: New areas of patchy infiltrate within the right lung as described. Followup PA and lateral chest X-ray is recommended in 3-4 weeks following trial of antibiotic therapy to ensure resolution and exclude underlying malignancy.  No other focal abnormality is noted.  ASSESSMENT & PLAN: 82 year old Caucasian female  1. Metastatic melanoma to lung, abdominal wall, subcutaneous soft tissue and lymph nodes -We reviewed her melanoma is incurable at this stage, and the goal of therapy is disease control and prolong her life  -she has been doing very well on immunotherapy. Her last CT scan in April 2016 showed no evidence of disease -I recommend to continue Nivo, she is tolerating well  -Giving the underlying dementia and advanced age, she would not be a good candidate for chemotherapy if her disease progress -Her restaging PET scan from  11/15/2014 was previously rewiewed with patient and her husband. No evidence of active disease. - lab results reviewed with patient and her husband. -She has not recovered well from her recent episode of pneumonia, still quite fatigued, I'll hold on Nivo today. -Repeat CT chest in 2 weeks, if her right lung infiltrates resolves on the skin, and she feels better, we'll resume her [name]. -If she has disease progression, due to her worsening dementia, I probably will not recommend further treatment, and switch her to palliative care alone.   2. Right pneumonia  -I reviewed her recent CT chest with her. Her right lung infiltrates, likely related to her pneumonia. But [name] related pneumonitis is not ruled out -She has clinically improved, her repeated chest x-ray was negative, which supports infection process. -I'll repeat a CT chest in 2 weeks to insure the resolution of the infiltrates in her right lung.  3.  History of bilateral breast lobular carcinoma -She is still on adjuvant Femara, we'll continue, a total 5 years -She is status post bilateral mastectomy, no need mammogram -I'll follow-up clinically - The bone density scan in July 2017  3. Dementia -She recently started Aricept, will follow up with her neurologist -her B12 and folate level are normal   4. Hypertension -Continue follow-up with primary care physician Dr. Sandi Mariscal   5. Hypothyroidism  -Her TSH has been fluctuating lately, will repeat every month when on Nivo  -she will follow up with PCP    Plan -Hold on Nivo today -return in 2 weeks with a repeated CT chest. If scan is good, will restart Nivo   All questions were answered. The patient knows to call the clinic with any problems, questions or concerns.  I spent 29mnutes counseling the patient face to face. The total time spent in the appointment was 30 minutes and more than 50% was on counseling.     FTruitt Merle MD 02/19/2015 10:22 PM

## 2015-02-19 NOTE — Patient Instructions (Signed)

## 2015-02-19 NOTE — Progress Notes (Signed)
I called and left message at [facility] to let them know to get prescription ready for patient. It was approved by debra [id number] pa# 01/20/15-02/19/16

## 2015-02-20 ENCOUNTER — Telehealth: Payer: Self-pay | Admitting: *Deleted

## 2015-02-20 ENCOUNTER — Telehealth: Payer: Self-pay | Admitting: Hematology

## 2015-02-20 NOTE — Telephone Encounter (Signed)
Reached daughter this eve & she will bring pt in tomorrow @ 11:30 am to 12 pm to reaccess port & flush.

## 2015-02-20 NOTE — Telephone Encounter (Signed)
Appointment made for 2/2 per 2/1 pof. Patient aware date/time per chart note.

## 2015-02-20 NOTE — Telephone Encounter (Signed)
Called & left message to call back.  Would like to see if pt could come back in office to check port site & reaccess & flush with saline & heparin.

## 2015-02-20 NOTE — Telephone Encounter (Signed)
Received call from pt's husband, Deidre Ala stating that pt saw Dr Burr Medico yest & didn't get treatment & they got home & his wife still had her port accessed. He reports that he took the needle out & applied a bandaid, stating that he has seen this done numerous times & didn't have a problem doing this.  Informed that we usually put heparin in before we deaccess port.  He states pt will return in 2 wks.  Requested that he place needle in a hard container & bring at next visit.

## 2015-02-21 ENCOUNTER — Ambulatory Visit: Payer: Medicare HMO

## 2015-02-21 NOTE — Progress Notes (Signed)
Pt here with daughter per request of RN to check port & flush with saline & heparin.  Site unremarkable.  Accessed without difficulty & good blood return & flushed per protocol. No charge per Hilario Quarry RN d/t pt going home with port accessed & apologizes given.

## 2015-03-01 ENCOUNTER — Telehealth: Payer: Self-pay | Admitting: *Deleted

## 2015-03-01 NOTE — Telephone Encounter (Signed)
Records from [facility] on [place].

## 2015-03-04 ENCOUNTER — Telehealth: Payer: Self-pay | Admitting: Neurology

## 2015-03-04 ENCOUNTER — Other Ambulatory Visit: Payer: Self-pay | Admitting: *Deleted

## 2015-03-04 ENCOUNTER — Ambulatory Visit (INDEPENDENT_AMBULATORY_CARE_PROVIDER_SITE_OTHER): Payer: Medicare HMO | Admitting: Neurology

## 2015-03-04 ENCOUNTER — Encounter: Payer: Self-pay | Admitting: Neurology

## 2015-03-04 ENCOUNTER — Telehealth: Payer: Self-pay | Admitting: *Deleted

## 2015-03-04 ENCOUNTER — Ambulatory Visit (HOSPITAL_COMMUNITY): Payer: Medicare HMO

## 2015-03-04 VITALS — BP 140/73 | HR 80 | Ht 69.0 in | Wt 118.4 lb

## 2015-03-04 DIAGNOSIS — F0391 Unspecified dementia with behavioral disturbance: Secondary | ICD-10-CM

## 2015-03-04 DIAGNOSIS — F03918 Unspecified dementia, unspecified severity, with other behavioral disturbance: Secondary | ICD-10-CM

## 2015-03-04 NOTE — Telephone Encounter (Signed)
Spouse called to advise they are running late for 11:30 appointment but should be here by 11:30am.

## 2015-03-04 NOTE — Patient Instructions (Addendum)
Overall you are doing fairly well but I do want to suggest a few things today:   Remember to drink plenty of fluid, eat healthy meals and do not skip any meals. Try to eat protein with a every meal and eat a healthy snack such as fruit or nuts in between meals. Try to keep a regular sleep-wake schedule and try to exercise daily, particularly in the form of walking, 20-30 minutes a day, if you can.   As far as your medications are concerned, I would like to suggest: Depakote in the evenings to help with behavioral disturbance. Recommend '250mg'$  ER and then increase to '500mg'$  ER. Recommend research into "Memory Units". Www.aplaceformom.com  I would like to see you back in 6 months, sooner if we need to. Please call us with any interim questions, concerns, problems, updates or refill requests.   Our phone number is [phone]. We also have an after hours call service for urgent matters and there is a physician on-call for urgent questions. For any emergencies you know to call 911 or go to the nearest emergency room

## 2015-03-04 NOTE — Progress Notes (Addendum)
[name]    Provider:  Dr Alexa Hardin Referring Provider: Derinda Late, MD Primary Care Physician:  Alexa Land, MD   CC: Memory loss  Interval history 03/04/2015: She doesn't remember being her before. Things are about the same. She was hospitalized with aspiration pneumonia recently. She had aspiration pneumonia, AKI and was discharged January 13th in 2017. She is on the Aricept without medication side effects. Here with husband. Discussed memory unit placement for patient at some point. She needs 24x7 care. Her appetite is poor on megace. Also on Remeron. She takes ativan prn for agitation. Her thyroid is followed by oncology Dr. Burr Hardin and Dr. Sandi Hardin. Discussed Namenda and decided to hold off for now. Do recommend Depakote in the evenings for behavioral disturbances.   HPI: Alexa Hardin is a 82 y.o. female here as a referral from Dr. Sandi Hardin for memory loss. PMHx HTN, hypothyroidism, COPD, hyperlipidemia and dementia. She was diagnosed as having metastatic malignant melanoma in 2013 initially in the right lung resected in 2013. In 2015 she had a resection of the metastatic melanoma of the right lateral back and upper arm with negative margins. She also has a history of breast cancer, infiltrating lobular carcinoma of the right breast and had a lumpectomy in 2001 status post chemotherapy and breast radiation. In 2013 she developed ductal carcinoma of the left breast and had bilateral mastectomies performed in 2013. She is here with her husband and daughter who provide all information. Patient and husband recently moved from [place] to be near daughter. The memory changes started at least 2 years ago or longer. The daughter has noticed a more significant decline in the last year. Even the last several months they have noticed fairly rapid decrease of functionality especially in executive function and short-term memory. Daughter first noticed memory changes in 2013  and since then it has very much declined. It started as forgetting peoples names and not remembering the grandchildrens' names, mixing up the names. Started with recent memories and then progressed to forgetting family birthdays. Patient became less social about a year ago. The activities of daily living started are impaired. Difficulty cooking, putting plastic on the cooktop. Difficulty following directions and putting tasks together. She had lost weight. She is having difficulty with showering and getting dressed. She is not cooking, not cleaning. She gets frustrated. Patient has some anger issues, some frustration, episodic agitation. Never violent. She got out of the car in one incident, she wouldn't get back into the car and the police were dispatched. Then she later rode home with husband without a problem. There is a lot of frustration. No falls at home. No hallucinations. There are delusions. Sleeping is better, she is on a sleep regimen and wandering in the middle of the night has resolved. They would like OT evaluations, PT and speech therapy.   Reviewed notes, labs and imaging from outside physicians, which showed: Per notes from Dr. 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 She is on aspirin 81 mg daily for stroke prevention. She was diagnosed as having metastatic malignant melanoma in 2013 initially in the right lung resected in 2013. In 2015 she had a resection of the metastatic melanoma of the right lateral back and upper arm with negative margins. The brain MRI April did not show any metastatic lesions. She's been receiving immunotherapy since the diagnosis was made with opdivo 153.6 mg every 2 weeks. She also  has a history of breast cancer, infiltrating lobular carcinoma of the right breast and had a lumpectomy in 2001 status post chemotherapy and breast radiation. In 2013 she developed ductal carcinoma of the left breast and had  bilateral mastectomies performed in 2013. She remains on Letrozole 2.5 mg daily. Last EKG showed a right bundle branch block, right axis deviation, nonspecific T-wave changes. She has hypothyroidism and hypertension. She also has COPD and hyperlipidemia.  Review of Systems: Patient complains of symptoms per HPI as well as the following symptoms: Weight loss, fatigue, feeling cold, rash, anxiety, memory loss, confusion, change in appetite.. Pertinent negatives per HPI. All others negative.    Social History   Social History  . Marital Status: Married    Spouse Name: Alexa Hardin  . Number of Children: 1  . Years of Education: 16   Occupational History  . 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 Smoking status: Former Smoker -- 1.50 packs/day for 30 years    Quit date: 01/20/1983  . Smokeless tobacco: Not on file  . Alcohol Use: 0.6 oz/week    1 Glasses of wine per week     Comment: every a few days   . Drug Use: No  . Sexual Activity: Not on file   Other Topics Concern  . Not on file   Social History Narrative   Lives at home with husband and daughter/son-in law   Caffeine use: 1 cup per day    Family History  Problem Relation Age of Onset  . Cancer Father     lung cancer  . Dementia Neg Hx     Past Medical History  Diagnosis Date  . Hypertension   . Cancer ([place])     Breast/melanoma  . Dementia     Past Surgical History  Procedure Laterality Date  . Lung removal, partial Left 2013  . Mastectomy Bilateral 2013  . Abdominal wall mass resection  06/15/2012    Current Outpatient Prescriptions  Medication Sig Dispense Refill  . aspirin 81 MG tablet Take 81 mg by mouth daily.    Marland Kitchen atenolol (TENORMIN) 50 MG tablet Take 50 mg by mouth daily.    . benzonatate (TESSALON) 200 MG capsule Take 1 capsule (200 mg total) by mouth 3 (three) times daily as needed for cough. 20 capsule 0  . Cholecalciferol (VITAMIN D3) 1000 UNITS CAPS Take 1 capsule  by mouth daily.    Marland Kitchen donepezil (ARICEPT) 10 MG tablet Take 1 tablet (10 mg total) by mouth at bedtime. 30 tablet 3  . letrozole (FEMARA) 2.5 MG tablet Take 1 tablet (2.5 mg total) by mouth daily. 30 tablet 5  . levothyroxine (SYNTHROID, LEVOTHROID) 50 MCG tablet Take 50 mcg by mouth daily before breakfast.    . LORazepam (ATIVAN) 0.5 MG tablet TK 1/2 TO 1 T PO BID PRA OR AGITATION  5  . megestrol (MEGACE ES) 625 MG/5ML suspension Take 5 mLs (625 mg total) by mouth daily. 150 mL 3  . mirtazapine (REMERON) 7.5 MG tablet Take 1 tablet (7.5 mg total) by mouth at bedtime. 30 tablet 0  . NIFEDICAL XL 30 MG 24 hr tablet TK 1 T PO D  1  . simvastatin (ZOCOR) 40 MG tablet Take 40 mg by mouth daily.     No current facility-administered medications for this visit.    Allergies as of 03/04/2015 - Review Complete 02/19/2015  Allergen Reaction Noted  . Quinolones Rash 08/28/2014  Vitals: There were no vitals taken for this visit. Last Weight:  Wt Readings from Last 1 Encounters:  02/19/15 118 lb 6.4 oz (53.706 kg)   Last Height:   Ht Readings from Last 1 Encounters:  02/19/15 '5\' 9"'$  (1.753 m)   Cognition:    The patient is oriented to person only    recent and remote memory impaired;     language fluent;     Impaired attention, concentration,     fund of knowledge impaired  Cranial Nerves:  The pupils are equal, round, and reactive to light.  Visual fields are full to threat bilaterally. Extraocular movements are intact. Trigeminal sensation is intact and the muscles of mastication are normal. The face is symmetric. The palate elevates in the midline. Hearing intact to voice. Voice is normal. Shoulder shrug is normal. The tongue has normal motion without fasciculations.   Gait:  Not ataxic  Motor Observation:  No asymmetry, no atrophy, and no involuntary movements noted. Tone:  Normal muscle tone.   Posture:  Posture is normal. normal erect   Strength:  Strength is  V/V in the upper and lower limbs.      Assessment/Plan: 82 y.o. female here as a referral from Dr. Sandi Hardin for memory loss. PMHx HTN, hypothyroidism, COPD, hyperlipidemia and dementia. She was diagnosed as having metastatic malignant melanoma in 2013 initially in the right lung resected in 2013. In 2015 she had a resection of the metastatic melanoma of the right lateral back and upper arm with negative margins. She also has a history of breast cancer, infiltrating lobular carcinoma of the right breast and had a lumpectomy in 2001 status post chemotherapy and breast radiation. In 2013 she developed ductal carcinoma of the left breast and had bilateral mastectomies performed in 2013. Patient has advanced dementia with a Montral cognitive assessment score of 5/30. She has severe impairment in difficulties with even basic activities of daily living such as showering and dressing. Dementia is likely of the Alzheimer's type. Patient cannot leave the home without assistance. Would benefit from physical therapy and occupational therapy within the home as well as speech therapy. We will ask Alvis Lemmings to evaluate patient for home services. We'll request MRI CD of the brain so that I can evaluate it. We'll order B12 and folate panel. We'll have family back in 3 months.  Received mri report: diffuse cerebral atrophy with superimposed small-vessel ischemic disease, stable from prior exam 09/2013. Stable left parietal encephalomalacia.   -Continue Aricept -Discussed Namanda, husband declines at this time because she takes so much medication -In the future can consider starting Depakote ER '250mg'$  then increase to '500mg'$  at night for behavioral problems -Discussed memory unit placement in the future.   CC: Dr. Sandi Hardin  Addendum reviewed records received. MRI of the brain with and without contrast showed diffuse cerebral atrophy and superimposed small vessel ischemic disease stable from prior examination on  10/03/2013. Stable encephalomalacia noted from remote cortical infarct in the left parietal lobe. No significant interval change. No evidence of recent infarct, mass or hemorrhage no evidence of intracranial metastatic disease. This was on 05/17/2014.  Sarina Ill, MD  [facility] [address] [place], [place] [address]  Phone [phone] Fax [phone]  A total of 30 minutes was spent face-to-face with this patient and husband. Over half this time was spent on counseling patient on the Alzheimer's dementia diagnosis and different diagnostic and therapeutic options available.

## 2015-03-04 NOTE — Telephone Encounter (Signed)
Noted, thank you

## 2015-03-04 NOTE — Telephone Encounter (Signed)
Called pt at home and left message on voice mail re:  Informed pt  NOT to come in for appts on 03/05/15.   Pt is scheduled for CT scan on 03/06/15.  Per Dr. Burr Medico,  Lab, office visit, and chemo appts will be rescheduled for either Thursday 2/16, or Fri  2/17.   POF sent to scheduler.  Left message asking husband to call office in the am 03/05/15 to confirm that he received nurse message.

## 2015-03-05 ENCOUNTER — Other Ambulatory Visit: Payer: Medicare HMO

## 2015-03-05 ENCOUNTER — Telehealth: Payer: Self-pay | Admitting: *Deleted

## 2015-03-05 ENCOUNTER — Telehealth: Payer: Self-pay | Admitting: Hematology

## 2015-03-05 ENCOUNTER — Ambulatory Visit: Payer: Medicare HMO | Admitting: Hematology

## 2015-03-05 ENCOUNTER — Ambulatory Visit: Payer: Medicare HMO

## 2015-03-05 NOTE — Telephone Encounter (Signed)
per pof to r/s appt-cld pt and daughter stated did not agree to r/s for Friday and they are r/s CT scan-trnasferred to [facility]

## 2015-03-05 NOTE — Telephone Encounter (Signed)
Per staff phone call and POF I have schedueld appts. Scheduler advised of appts.  JMW  

## 2015-03-06 ENCOUNTER — Ambulatory Visit (HOSPITAL_COMMUNITY): Payer: Medicare HMO

## 2015-03-07 ENCOUNTER — Telehealth: Payer: Self-pay | Admitting: Hematology

## 2015-03-07 ENCOUNTER — Ambulatory Visit (HOSPITAL_COMMUNITY)
Admission: RE | Admit: 2015-03-07 | Discharge: 2015-03-07 | Disposition: A | Discharge status: Home / Self Care | Payer: Medicare HMO | Source: Ambulatory Visit | Attending: Hematology | Admitting: Hematology

## 2015-03-07 DIAGNOSIS — R918 Other nonspecific abnormal finding of lung field: Secondary | ICD-10-CM | POA: Diagnosis not present

## 2015-03-07 DIAGNOSIS — C439 Malignant melanoma of skin, unspecified: Secondary | ICD-10-CM | POA: Diagnosis present

## 2015-03-07 NOTE — Telephone Encounter (Signed)
cld & spoke to pt friend anf gave appt time & date for 2/17

## 2015-03-08 ENCOUNTER — Telehealth: Payer: Self-pay | Admitting: *Deleted

## 2015-03-08 ENCOUNTER — Other Ambulatory Visit: Payer: Medicare HMO

## 2015-03-08 ENCOUNTER — Encounter: Payer: Self-pay | Admitting: Hematology

## 2015-03-08 ENCOUNTER — Ambulatory Visit (HOSPITAL_BASED_OUTPATIENT_CLINIC_OR_DEPARTMENT_OTHER): Payer: Medicare HMO | Admitting: Hematology

## 2015-03-08 ENCOUNTER — Other Ambulatory Visit (HOSPITAL_BASED_OUTPATIENT_CLINIC_OR_DEPARTMENT_OTHER): Payer: Medicare HMO

## 2015-03-08 ENCOUNTER — Telehealth: Payer: Self-pay | Admitting: Hematology

## 2015-03-08 ENCOUNTER — Ambulatory Visit (HOSPITAL_BASED_OUTPATIENT_CLINIC_OR_DEPARTMENT_OTHER): Payer: Medicare HMO

## 2015-03-08 ENCOUNTER — Ambulatory Visit: Payer: Medicare HMO

## 2015-03-08 VITALS — BP 142/63 | HR 71 | Resp 17 | Ht 69.0 in | Wt 116.6 lb

## 2015-03-08 DIAGNOSIS — F0390 Unspecified dementia without behavioral disturbance: Secondary | ICD-10-CM

## 2015-03-08 DIAGNOSIS — C792 Secondary malignant neoplasm of skin: Secondary | ICD-10-CM

## 2015-03-08 DIAGNOSIS — E039 Hypothyroidism, unspecified: Secondary | ICD-10-CM

## 2015-03-08 DIAGNOSIS — Z853 Personal history of malignant neoplasm of breast: Secondary | ICD-10-CM

## 2015-03-08 DIAGNOSIS — C779 Secondary and unspecified malignant neoplasm of lymph node, unspecified: Secondary | ICD-10-CM

## 2015-03-08 DIAGNOSIS — C7800 Secondary malignant neoplasm of unspecified lung: Secondary | ICD-10-CM

## 2015-03-08 DIAGNOSIS — Z5112 Encounter for antineoplastic immunotherapy: Secondary | ICD-10-CM | POA: Diagnosis not present

## 2015-03-08 DIAGNOSIS — C50911 Malignant neoplasm of unspecified site of right female breast: Secondary | ICD-10-CM

## 2015-03-08 DIAGNOSIS — C799 Secondary malignant neoplasm of unspecified site: Secondary | ICD-10-CM

## 2015-03-08 DIAGNOSIS — C439 Malignant melanoma of skin, unspecified: Secondary | ICD-10-CM

## 2015-03-08 DIAGNOSIS — C50912 Malignant neoplasm of unspecified site of left female breast: Secondary | ICD-10-CM

## 2015-03-08 DIAGNOSIS — C7989 Secondary malignant neoplasm of other specified sites: Secondary | ICD-10-CM

## 2015-03-08 DIAGNOSIS — J189 Pneumonia, unspecified organism: Secondary | ICD-10-CM

## 2015-03-08 LAB — COMPREHENSIVE METABOLIC PANEL
ALT: 13 U/L (ref 0–55)
ANION GAP: 12 meq/L — AB (ref 3–11)
AST: 23 U/L (ref 5–34)
Albumin: 3.5 g/dL (ref 3.5–5.0)
Alkaline Phosphatase: 60 U/L (ref 40–150)
BILIRUBIN TOTAL: 0.7 mg/dL (ref 0.20–1.20)
BUN: 18.6 mg/dL (ref 7.0–26.0)
CALCIUM: 9.4 mg/dL (ref 8.4–10.4)
CHLORIDE: 104 meq/L (ref 98–109)
CO2: 25 meq/L (ref 22–29)
CREATININE: 1 mg/dL (ref 0.6–1.1)
EGFR: 52 mL/min/{1.73_m2} — ABNORMAL LOW (ref >90)
Glucose: 86 mg/dl (ref 70–140)
Potassium: 3.6 mEq/L (ref 3.5–5.1)
Sodium: 141 mEq/L (ref 136–145)
TOTAL PROTEIN: 7.2 g/dL (ref 6.4–8.3)

## 2015-03-08 LAB — CBC WITH DIFFERENTIAL/PLATELET
BASO%: 1.2 % (ref 0.0–2.0)
Basophils Absolute: 0.1 10*3/uL (ref 0.0–0.1)
EOS ABS: 0.1 10*3/uL (ref 0.0–0.5)
EOS%: 0.9 % (ref 0.0–7.0)
HCT: 37.6 % (ref 34.8–46.6)
HEMOGLOBIN: 12.1 g/dL (ref 11.6–15.9)
LYMPH%: 16.3 % (ref 14.0–49.7)
MCH: 25.8 pg (ref 25.1–34.0)
MCHC: 32.1 g/dL (ref 31.5–36.0)
MCV: 80.5 fL (ref 79.5–101.0)
MONO#: 1.1 10*3/uL — AB (ref 0.1–0.9)
MONO%: 12.5 % (ref 0.0–14.0)
NEUT%: 69.1 % (ref 38.4–76.8)
NEUTROS ABS: 5.9 10*3/uL (ref 1.5–6.5)
PLATELETS: 282 10*3/uL (ref 145–400)
RBC: 4.67 10*6/uL (ref 3.70–5.45)
RDW: 15.7 % — AB (ref 11.2–14.5)
WBC: 8.5 10*3/uL (ref 3.9–10.3)
lymph#: 1.4 10*3/uL (ref 0.9–3.3)

## 2015-03-08 LAB — TSH: TSH: 3.638 m[IU]/L (ref 0.308–3.960)

## 2015-03-08 MED ORDER — HEPARIN SOD (PORK) LOCK FLUSH 100 UNIT/ML IV SOLN
500.0000 [IU] | Freq: Once | INTRAVENOUS | Status: AC | PRN
  Administered 2015-03-08: 500 [IU]
  Filled 2015-03-08: qty 5

## 2015-03-08 MED ORDER — SODIUM CHLORIDE 0.9 % IV SOLN
240.0000 mg | Freq: Once | INTRAVENOUS | Status: AC
  Administered 2015-03-08: 240 mg via INTRAVENOUS
  Filled 2015-03-08: qty 24

## 2015-03-08 MED ORDER — SODIUM CHLORIDE 0.9 % IJ SOLN
10.0000 mL | INTRAMUSCULAR | Status: DC | PRN
  Administered 2015-03-08: 10 mL
  Filled 2015-03-08: qty 10

## 2015-03-08 MED ORDER — MIRTAZAPINE 15 MG PO TABS: 15.0000 mg | ORAL_TABLET | Freq: Every day | ORAL | Status: DC

## 2015-03-08 MED ORDER — SODIUM CHLORIDE 0.9 % IV SOLN
Freq: Once | INTRAVENOUS | Status: AC
  Administered 2015-03-08: 15:00:00 via INTRAVENOUS

## 2015-03-08 MED ORDER — SODIUM CHLORIDE 0.9 % IJ SOLN
10.0000 mL | Freq: Once | INTRAMUSCULAR | Status: AC
  Administered 2015-03-08: 10 mL via INTRAVENOUS
  Filled 2015-03-08: qty 10

## 2015-03-08 NOTE — Progress Notes (Signed)
[place]  [phone] [phone]) [phone]  Clinic follow up Note   Patient Care Team: Derinda Late, MD as PCP - General (Family Medicine) Truitt Merle, MD as Consulting Physician (Hematology) 03/08/2015  CHIEF COMPLAINTS:  Follow up metastatic melanoma  Oncology History   Metastatic melanoma   Staging form: Melanoma of the Skin, AJCC 7th Edition     Clinical: Stage IV ([place], NX, M1b) - Unsigned          Malignant melanoma of unknown origin ([place])   11/03/2011 Imaging PET CT scan showed hypermetabolic nodule in the lingular, 3 cm, consistent with known metastatic disease, hypermetabolic focus in the medial right thigh, without visible lesion on CT. Mild activity in anterior lateral chest wall favoring postsurgical    2013 Initial Diagnosis Metastatic melanoma   10/21/2011 Initial Biopsy Left lung mass biopsy, tumor is present suggests melanoma.   12/02/2011 Surgery Left alone mass which resection, metastatic melanoma, parenchymal margin negative. Lingular mass, negative margins. [place] staining consistent with melanoma.   01/15/2012 Miscellaneous BRAF and c-kit mutation and lysis were negative   06/29/2012 -  Chemotherapy Yervoy, followed by Bery[facility] Flock, and currently on Opdivo    11/21/2013 Surgery Resection of metastatic melanoma in right lateral back and right upper arm, past was consistent with melanoma. Margins were negative.   05/17/2014 Imaging Brain MRI showed diffuse cerebral atrophy with superimposed small vessel ischemia disease stable from prior exam. No evidence of intracranial metastasis.   05/17/2014 Imaging CT of the chest, abdomen and pelvis showed no findings to suggest metastatic disease. Decreased in size of perifascial fat of the right flank, previous right pelvic node no longer seen.   11/16/2014 Imaging PET scan showed no conviencing evidence of disese   01/29/2015 - 02/01/2015 Hospital Admission admitted for CAP     Breast cancer, right  breast ([place])   09/26/1999 Initial Diagnosis Right breast lumpectomy showed infiltrating lobular carcinoma, grade 2, T2N1, surgical margins were negative. Laboratory carcinoma in situ.   09/26/1999 Receptors her2 ER 70% positive, PR 10% positive, HER-2 negative.   2001 -  Adjuvant Chemotherapy Adjuvant Adriamycin, Cytoxan and Taxol.   2001 -  Radiation Therapy Adjuvant breast radiation   2002 - 2007 Anti-estrogen oral therapy Tamoxifen for one year, followed by Arimidex which was later on switched to Aromasin due to side effects, for total 5 years.    Breast cancer, left breast ([place])   05/28/2011 Initial Diagnosis Breast cancer, left breast   05/28/2011 Initial Biopsy Left breast at 12:00 position biopsy showed ductal carcinoma in situ high-grade, left breast 3 clock position biopsy showed infiltrating lobular carcinoma.   05/28/2011 Receptors her2 ER positive, PR positive, HER-2 negative. Oncotype recurrence score 13   06/19/2011 Surgery Right breast exertion, no cancer. Sentinel lymph node negative. Left breast exertion showed focal invasive lobular carcinoma 8 mm maximal dimension. Grade 1, DCIS present. Total 41 is negative.   HISTORY OF PRESENTING ILLNESS:  Alexa Hardin 82 y.o. female is here to transfer her oncological care to [place] due to the recent relocation. She is accompanied by her husband and daughter to the clinic today.  Please see the summary for her oncological issue above. In summary, she had a stage II of right breast lobular carcinoma in 2001, and stage I left breast lobular carcinoma in 2013, and currently on adjuvant Femara. She was diagnosed with metastatic melanoma to lung, subcutaneous and the lymph nodes in October 2013, status post surgical resection of the lung metastases, and to  subcutaneous lesion. She has been on immunotherapy since then, including YervoyX4, followed by [facility], and currently on [name] Sep 2015. She has been tolerating treatment very well, with only mild  fatigue, no significant other side effects.  She used to live with her husband in [place], was recently moved to [place] nose: To be with her daughter and her family. Her family members have noticed worsening memory loss, some cognitive dysfunction, and some personality change, such as anger control, in the past several months. One day a few weeks ago, she was found wandering in the parking lot outside of her hotel room at 4:00 in the morning. She needs supervision for her daily activities, such as showering, taking medication. Patient denies any of the above issues, but her conversation was disorganized, and had significant short-term memory issue when I tested her.  She has low to moderate appetite ,  on Megace, eats small portion of female, she lost about 20 lbs in the past two years, No significant weight loss lately. She denies any significant pain, dyspnea, abdominal discomfort, or change of her bowel habits. She is able to ambulatory independently without difficulty. She is not very physically active, but able to tolerate daily routine activity.   CURRENT THERAPY: [name] 59m/kg (changed to 2412mon 12/10/2014) every 2 weeks, since 09/2013, held since 01/22/2015 due to pneumonia, restarted on 03/08/15   INHavanaeturns for follow-up. She has recovered better from her recent pneumonia. She denies cough, chest pain or other new symptoms. Her fatigue has slightly improved, she is able to tolerate more physical activities, such as climbing the stairs. Her memory and cognitive function, however has deteriorated significantly in the past several months. She sometimes is slightly combative, and her husband has been giving her Ativan to calm her down.  MEDICAL HISTORY:  Past Medical History  Diagnosis Date  . Hypertension   . [place]    Breast/melanoma  . Dementia     SURGICAL HISTORY: Past Surgical History  Procedure Laterality Date  . Lung removal, partial Left  2013  . Mastectomy Bilateral 2013  . Abdominal wall mass resection  06/15/2012    SOCIAL HISTORY: Social History   Social History  . Marital Status: Married    Spouse Name: RaDeidre Ala. Number of Children: 1  . Years of Education: 16   Occupational History  . Retired [employer] years)    Social History Main Topics  . Smoking status: Former Smoker -- 1.50 packs/day for 30 years    Quit date: 01/20/1983  . Smokeless tobacco: Not on file  . Alcohol Use: 0.6 oz/week    1 Glasses of wine per week     Comment: every a few days   . Drug Use: No  . Sexual Activity: Not on file   Other Topics Concern  . Not on file   Social History Narrative   Lives at home with husband and daughter/son-in law   Caffeine use: 1 cup per day    FAMILY HISTORY: Family History  Problem Relation Age of Onset  . Cancer Father     lung cancer  . Dementia Neg Hx     ALLERGIES:  is allergic to quinolones.  MEDICATIONS:  Current Outpatient Prescriptions  Medication Sig Dispense Refill  . aspirin 81 MG tablet Take 81 mg by mouth daily.    . Marland Kitchentenolol (TENORMIN) 50 MG tablet Take 50 mg by mouth daily.    . Cholecalciferol (VITAMIN D3)  1000 UNITS CAPS Take 1 capsule by mouth daily.    Marland Kitchen donepezil (ARICEPT) 10 MG tablet Take 1 tablet (10 mg total) by mouth at bedtime. 30 tablet 3  . letrozole (FEMARA) 2.5 MG tablet Take 1 tablet (2.5 mg total) by mouth daily. 30 tablet 5  . levothyroxine (SYNTHROID, LEVOTHROID) 50 MCG tablet Take 50 mcg by mouth daily before breakfast.    . LORazepam (ATIVAN) 0.5 MG tablet Reported on 03/04/2015  5  . losartan (COZAAR) 100 MG tablet Take 100 mg by mouth daily.    . megestrol (MEGACE ES) 625 MG/5ML suspension Take 5 mLs (625 mg total) by mouth daily. 150 mL 3  . mirtazapine (REMERON) 7.5 MG tablet Take 1 tablet (7.5 mg total) by mouth at bedtime. (Patient not taking: Reported on 03/04/2015) 30 tablet 0  . potassium chloride SA (K-DUR,KLOR-CON) 20 MEQ tablet Take  20 mEq by mouth daily.    . simvastatin (ZOCOR) 40 MG tablet Take 40 mg by mouth daily.     No current facility-administered medications for this visit.    REVIEW OF SYSTEMS:   Constitutional: Denies fevers, chills or abnormal night sweats Eyes: Denies blurriness of vision, double vision or watery eyes Ears, nose, mouth, throat, and face: Denies mucositis or sore throat Respiratory: Denies cough, dyspnea or wheezes Cardiovascular: Denies palpitation, chest discomfort or lower extremity swelling Gastrointestinal:  Denies nausea, heartburn or change in bowel habits Skin: Denies abnormal skin rashes Lymphatics: Denies new lymphadenopathy or easy bruising Neurological:Denies numbness, tingling or new weaknesses Behavioral/Psych: Mood is stable, no new changes  All other systems were reviewed with the patient and are negative.  PHYSICAL EXAMINATION: ECOG PERFORMANCE STATUS: 2 - Symptomatic, <50% confined to bed  Filed Vitals:   03/08/15 1332  BP: 142/63  Pulse: 71  Resp: 17   Filed Weights   03/08/15 1332  Weight: 116 lb 9.6 oz (52.889 kg)    GENERAL:alert, no distress and comfortable SKIN: skin color, texture, turgor are normal, no rashes or significant lesions EYES: normal, conjunctiva are pink and non-injected, sclera clear OROPHARYNX:no exudate, no erythema and lips, buccal mucosa, and tongue normal  NECK: supple, thyroid normal size, non-tender, without nodularity LYMPH:  no palpable lymphadenopathy in the cervical, axillary or inguinal LUNGS: clear to auscultation and percussion with normal breathing effort HEART: regular rate & rhythm and no murmurs and no lower extremity edema ABDOMEN:abdomen soft, non-tender and normal bowel sounds Musculoskeletal:no cyanosis of digits and no clubbing  PSYCH: alert & oriented x 3 with fluent speech NEURO: no focal motor/sensory deficits Breasts: s/p bilateral mastectomy. Palpation of the chest wall and axilla revealed no obvious mass  that I could appreciate.   LABORATORY DATA:  I have reviewed the data as listed  CBC Latest Ref Rng 03/08/2015 02/19/2015 01/30/2015  WBC 3.9 - 10.3 10e3/uL 8.5 8.9 12.0(H)  Hemoglobin 11.6 - 15.9 g/dL 12.1 11.5(L) 11.3(L)  Hematocrit 34.8 - 46.6 % 37.6 35.5 35.8(L)  Platelets 145 - 400 10e3/uL 282 320 307    CMP Latest Ref Rng 03/08/2015 02/19/2015 01/31/2015  Glucose 70 - 140 mg/dl 86 83 86  BUN 7.0 - 26.0 mg/dL 18.6 22.1 17  Creatinine 0.6 - 1.1 mg/dL 1.0 1.1 0.99  Sodium 136 - 145 mEq/L 141 141 142  Potassium 3.5 - 5.1 mEq/L 3.6 3.5 3.9  Chloride 101 - 111 mmol/L - - 110  CO2 22 - 29 mEq/L 25 24 26   Calcium 8.4 - 10.4 mg/dL 9.4 9.1 8.6(L)  Total Protein  6.4 - 8.3 g/dL 7.2 6.9 -  Total Bilirubin 0.20 - 1.20 mg/dL 0.70 0.60 -  Alkaline Phos 40 - 150 U/L 60 50 -  AST 5 - 34 U/L 23 18 -  ALT 0 - 55 U/L 13 13 -      RADIOGRAPHIC STUDIES: I have personally reviewed the radiological images as listed and agreed with the findings in the report.  CT chest wo contrast 03/07/2015 IMPRESSION: 1. No acute findings in the thorax. No pneumonia or pleural fluid. 2. Stable apical consolidation in the RIGHT hemi thorax. 3. Hyperinflated lungs.   ASSESSMENT & PLAN: 82 year old Caucasian female  1. Metastatic melanoma to lung, abdominal wall, subcutaneous soft tissue and lymph nodes -We reviewed her melanoma is incurable at this stage, and the goal of therapy is disease control and prolong her life  -she has been doing very well on immunotherapy. Her last CT scan in April 2016 showed no evidence of disease -I reviewed her repeat his CT scan from 12/05/2015, which showed resolved pulmonary infiltrates. No new lesions. -Giving the underlying dementia and advanced age, she would not be a good candidate for chemotherapy if her disease progress -Her dementia has been getting worse lately. Family members agree the goal of care is palliation. I had a lengthy discussion with patient's husband and  her daughter regarding continue treatment versus wait and watch. Since she has physically recovered well from her recent pneumonia, family decided to continue [name]. - lab results reviewed with patient and her husband.  2. Right pneumonia  -I reviewed her recent CT chest from 2.16 with her husband and daughter. Her right lung infiltrates have resolved, which is consistent with infectious pneumonia. -She has recovered well from this pneumonia  3.  History of bilateral breast lobular carcinoma -She is still on adjuvant Femara, we'll continue, a total 5 years -She is status post bilateral mastectomy, no need mammogram -I'll follow-up clinically   3. Dementia -Her memory and cognitive function have gotten worse lately  -She is on Aricept, will follow up with her neurologist -her B12 and folate level are normal   4. Hypertension -Continue follow-up with primary care physician Dr. Sandi Mariscal   5. Hypothyroidism  -Her TSH has been fluctuating lately, will repeat every month when on Nivo  -she will follow up with PCP    Plan -resume Nivo today and continue every 2 weeks -I'll see her back in 4 weeks  All questions were answered. The patient knows to call the clinic with any problems, questions or concerns.  I spent 69mnutes counseling the patient face to face. The total time spent in the appointment was 30 minutes and more than 50% was on counseling.     FTruitt Merle MD 03/08/2015

## 2015-03-08 NOTE — Patient Instructions (Signed)
[facility] Discharge Instructions for Patients Receiving Chemotherapy  Today you received the following chemotherapy agents [name]  To help prevent nausea and vomiting after your treatment, we encourage you to take your nausea medication     If you develop nausea and vomiting that is not controlled by your nausea medication, call the clinic.   BELOW ARE SYMPTOMS THAT SHOULD BE REPORTED IMMEDIATELY:  *FEVER GREATER THAN 100.5 F  *CHILLS WITH OR WITHOUT FEVER  NAUSEA AND VOMITING THAT IS NOT CONTROLLED WITH YOUR NAUSEA MEDICATION  *UNUSUAL SHORTNESS OF BREATH  *UNUSUAL BRUISING OR BLEEDING  TENDERNESS IN MOUTH AND THROAT WITH OR WITHOUT PRESENCE OF ULCERS  *URINARY PROBLEMS  *BOWEL PROBLEMS  UNUSUAL RASH Items with * indicate a potential emergency and should be followed up as soon as possible.  Feel free to call the clinic you have any questions or concerns. The clinic phone number is [phone].  Please show the CHEMO ALERT CARD at check-in to the Emergency Department and triage nurse.   

## 2015-03-08 NOTE — Telephone Encounter (Signed)
per pof to sch pt appt-sent to MW to sch trmt-gave pt copy of avs

## 2015-03-08 NOTE — Patient Instructions (Signed)

## 2015-03-08 NOTE — Telephone Encounter (Signed)
Per staff message and POF I have scheduled appts. Advised scheduler of appts. JMW  

## 2015-03-09 ENCOUNTER — Encounter: Payer: Self-pay | Admitting: Hematology

## 2015-03-12 ENCOUNTER — Other Ambulatory Visit: Payer: Self-pay | Admitting: *Deleted

## 2015-03-12 ENCOUNTER — Ambulatory Visit (HOSPITAL_COMMUNITY)
Admission: RE | Admit: 2015-03-12 | Discharge: 2015-03-12 | Disposition: A | Discharge status: Home / Self Care | Payer: Medicare HMO | Source: Ambulatory Visit | Attending: Hematology | Admitting: Hematology

## 2015-03-12 ENCOUNTER — Telehealth: Payer: Self-pay | Admitting: *Deleted

## 2015-03-12 DIAGNOSIS — C439 Malignant melanoma of skin, unspecified: Secondary | ICD-10-CM

## 2015-03-12 DIAGNOSIS — M7989 Other specified soft tissue disorders: Secondary | ICD-10-CM | POA: Insufficient documentation

## 2015-03-12 NOTE — Telephone Encounter (Signed)
Received call from Vascular Lab reporting that venous dopplers were neg & pt will be allowed to go home.

## 2015-03-12 NOTE — Progress Notes (Signed)
VASCULAR LAB PRELIMINARY  PRELIMINARY  PRELIMINARY  PRELIMINARY  Bilateral lower extremity venous duplex completed.    Preliminary report:  Bilateral:  No evidence of DVT, superficial thrombosis, or Baker's Cyst.   Jayli Fogleman, RVS 03/12/2015, 4:54 PM

## 2015-03-17 ENCOUNTER — Emergency Department (HOSPITAL_BASED_OUTPATIENT_CLINIC_OR_DEPARTMENT_OTHER)
Admission: EM | Admit: 2015-03-17 | Discharge: 2015-03-17 | Disposition: A | Discharge status: Home / Self Care | Payer: Medicare HMO | Attending: Emergency Medicine | Admitting: Emergency Medicine

## 2015-03-17 ENCOUNTER — Emergency Department (HOSPITAL_BASED_OUTPATIENT_CLINIC_OR_DEPARTMENT_OTHER): Payer: Medicare HMO

## 2015-03-17 ENCOUNTER — Encounter (HOSPITAL_BASED_OUTPATIENT_CLINIC_OR_DEPARTMENT_OTHER): Payer: Self-pay | Admitting: *Deleted

## 2015-03-17 DIAGNOSIS — I10 Essential (primary) hypertension: Secondary | ICD-10-CM | POA: Insufficient documentation

## 2015-03-17 DIAGNOSIS — Z79899 Other long term (current) drug therapy: Secondary | ICD-10-CM | POA: Diagnosis not present

## 2015-03-17 DIAGNOSIS — F0391 Unspecified dementia with behavioral disturbance: Secondary | ICD-10-CM | POA: Insufficient documentation

## 2015-03-17 DIAGNOSIS — R531 Weakness: Secondary | ICD-10-CM | POA: Diagnosis not present

## 2015-03-17 DIAGNOSIS — Z87891 Personal history of nicotine dependence: Secondary | ICD-10-CM | POA: Insufficient documentation

## 2015-03-17 DIAGNOSIS — E876 Hypokalemia: Secondary | ICD-10-CM | POA: Diagnosis not present

## 2015-03-17 DIAGNOSIS — R5383 Other fatigue: Secondary | ICD-10-CM | POA: Insufficient documentation

## 2015-03-17 DIAGNOSIS — R4182 Altered mental status, unspecified: Secondary | ICD-10-CM | POA: Diagnosis present

## 2015-03-17 DIAGNOSIS — R410 Disorientation, unspecified: Secondary | ICD-10-CM | POA: Diagnosis not present

## 2015-03-17 DIAGNOSIS — Z7982 Long term (current) use of aspirin: Secondary | ICD-10-CM | POA: Diagnosis not present

## 2015-03-17 DIAGNOSIS — Z853 Personal history of malignant neoplasm of breast: Secondary | ICD-10-CM | POA: Diagnosis not present

## 2015-03-17 DIAGNOSIS — Z85820 Personal history of malignant melanoma of skin: Secondary | ICD-10-CM | POA: Insufficient documentation

## 2015-03-17 LAB — COMPREHENSIVE METABOLIC PANEL
ALBUMIN: 3.6 g/dL (ref 3.5–5.0)
ALK PHOS: 54 U/L (ref 38–126)
ALT: 14 U/L (ref 14–54)
ANION GAP: 8 (ref 5–15)
AST: 24 U/L (ref 15–41)
BILIRUBIN TOTAL: 0.7 mg/dL (ref 0.3–1.2)
BUN: 21 mg/dL — AB (ref 6–20)
CALCIUM: 8.8 mg/dL — AB (ref 8.9–10.3)
CO2: 28 mmol/L (ref 22–32)
Chloride: 104 mmol/L (ref 101–111)
Creatinine, Ser: 0.82 mg/dL (ref 0.44–1.00)
GFR calc Af Amer: >60 mL/min (ref >60)
GFR calc non Af Amer: >60 mL/min (ref >60)
GLUCOSE: 86 mg/dL (ref 65–99)
Potassium: 3 mmol/L — ABNORMAL LOW (ref 3.5–5.1)
SODIUM: 140 mmol/L (ref 135–145)
TOTAL PROTEIN: 7.5 g/dL (ref 6.5–8.1)

## 2015-03-17 LAB — CBC WITH DIFFERENTIAL/PLATELET
BASOS PCT: 0 %
Basophils Absolute: 0 10*3/uL (ref 0.0–0.1)
EOS PCT: 1 %
Eosinophils Absolute: 0.1 10*3/uL (ref 0.0–0.7)
HEMATOCRIT: 38.2 % (ref 36.0–46.0)
Hemoglobin: 12.2 g/dL (ref 12.0–15.0)
LYMPHS PCT: 15 %
Lymphs Abs: 1 10*3/uL (ref 0.7–4.0)
MCH: 26.3 pg (ref 26.0–34.0)
MCHC: 31.9 g/dL (ref 30.0–36.0)
MCV: 82.5 fL (ref 78.0–100.0)
MONO ABS: 0.7 10*3/uL (ref 0.1–1.0)
MONOS PCT: 11 %
NEUTROS ABS: 4.7 10*3/uL (ref 1.7–7.7)
Neutrophils Relative %: 73 %
PLATELETS: 239 10*3/uL (ref 150–400)
RBC: 4.63 MIL/uL (ref 3.87–5.11)
RDW: 15 % (ref 11.5–15.5)
WBC: 6.4 10*3/uL (ref 4.0–10.5)

## 2015-03-17 LAB — URINALYSIS, ROUTINE W REFLEX MICROSCOPIC
Bilirubin Urine: NEGATIVE
GLUCOSE, UA: NEGATIVE mg/dL
Hgb urine dipstick: NEGATIVE
KETONES UR: NEGATIVE mg/dL
Leukocytes, UA: NEGATIVE
NITRITE: NEGATIVE
PH: 7 (ref 5.0–8.0)
Protein, ur: NEGATIVE mg/dL
SPECIFIC GRAVITY, URINE: 1.01 (ref 1.005–1.030)

## 2015-03-17 LAB — CBG MONITORING, ED: Glucose-Capillary: 75 mg/dL (ref 65–99)

## 2015-03-17 MED ORDER — POTASSIUM CHLORIDE CRYS ER 20 MEQ PO TBCR
40.0000 meq | EXTENDED_RELEASE_TABLET | Freq: Once | ORAL | Status: AC
  Administered 2015-03-17: 40 meq via ORAL
  Filled 2015-03-17: qty 2

## 2015-03-17 MED ORDER — SODIUM CHLORIDE 0.9 % IV BOLUS (SEPSIS)
500.0000 mL | Freq: Once | INTRAVENOUS | Status: AC
  Administered 2015-03-17: 500 mL via INTRAVENOUS

## 2015-03-17 MED ORDER — LORAZEPAM 2 MG/ML IJ SOLN
0.2500 mg | Freq: Once | INTRAMUSCULAR | Status: AC
Start: 2015-03-17 — End: 2015-03-17
  Administered 2015-03-17: 0.25 mg via INTRAVENOUS
  Filled 2015-03-17: qty 1

## 2015-03-17 NOTE — ED Notes (Signed)
Patient transported to CT 

## 2015-03-17 NOTE — ED Notes (Signed)
Pt became very agitated and combative in CT scan, pt refuses to take PO K+ tabs, order rec'd from EDP for .'25mg'$  Ativan IV, safety measures in place continues

## 2015-03-17 NOTE — ED Notes (Signed)
Pt unable to state time, location, pt knows name and responds to name being called, will follow simple commands, but often will have to have repeated request to follow commands. Pt noted to be rambling with speech.

## 2015-03-17 NOTE — ED Notes (Signed)
Family member remains at bedside, safety measures remain in place, pt appears more calm and cooperative at this time.

## 2015-03-17 NOTE — ED Notes (Signed)
PT HIGH FALL RISK, INTERVENTIONS IMPLEMENTED, SR X 2 UP, FAMILY AT SIDE, YELLOW SOCK APPLIED, ARM BAND PLACED, DOOR SIGN PLACED

## 2015-03-17 NOTE — Discharge Instructions (Signed)
Dementia Dementia is a general term for problems with brain function. A person with dementia has memory loss and a hard time with at least one other brain function such as thinking, speaking, or problem solving. Dementia can affect social functioning, how you do your job, your mood, or your personality. The changes may be hidden for a long time. The earliest forms of this disease are usually not detected by family or friends. Dementia can be:  Irreversible.  Potentially reversible.  Partially reversible.  Progressive. This means it can get worse over time. CAUSES  Irreversible dementia causes may include:  Degeneration of brain cells (Alzheimer disease or Lewy body dementia).  Multiple small strokes (vascular dementia).  Infection (chronic meningitis or Creutzfeldt-Jakob disease).  Frontotemporal dementia. This affects younger people, age 80 to 80, compared to those who have Alzheimer disease.  Dementia associated with other disorders like Parkinson disease, Huntington disease, or HIV-associated dementia. Potentially or partially reversible dementia causes may include:  Medicines.  Metabolic causes such as excessive alcohol intake, vitamin B12 deficiency, or thyroid disease.  Masses or pressure in the brain such as a tumor, blood clot, or hydrocephalus. SIGNS AND SYMPTOMS  Symptoms are often hard to detect. Family members or coworkers may not notice them early in the disease process. Different people with dementia may have different symptoms. Symptoms can include:  A hard time with memory, especially recent memory. Long-term memory may not be impaired.  Asking the same question multiple times or forgetting something someone just said.  A hard time speaking your thoughts or finding certain words.  A hard time solving problems or performing familiar tasks (such as how to use a telephone).  Sudden changes in mood.  Changes in personality, especially increasing moodiness or  mistrust.  Depression.  A hard time understanding complex ideas that were never a problem in the past. DIAGNOSIS  There are no specific tests for dementia.   Your health care provider may recommend a thorough evaluation. This is because some forms of dementia can be reversible. The evaluation will likely include a physical exam and getting a detailed history from you and a family member. The history often gives the best clues and suggestions for a diagnosis.  Memory testing may be done. A detailed brain function evaluation called neuropsychologic testing may be helpful.  Lab tests and brain imaging (such as a CT scan or MRI scan) are sometimes important.  Sometimes observation and re-evaluation over time is very helpful. TREATMENT  Treatment depends on the cause.   If the problem is a vitamin deficiency, it may be helped or cured with supplements.  For dementias such as Alzheimer disease, medicines are available to stabilize or slow the course of the disease. There are no cures for this type of dementia.  Your health care provider can help direct you to groups, organizations, and other health care providers to help with decisions in the care of you or your loved one. HOME CARE INSTRUCTIONS The care of individuals with dementia is varied and dependent upon the progression of the dementia. The following suggestions are intended for the person living with, or caring for, the person with dementia.  Create a safe environment.  Remove the locks on bathroom doors to prevent the person from accidentally locking himself or herself in.  Use childproof latches on kitchen cabinets and any place where cleaning supplies, chemicals, or alcohol are kept.  Use childproof covers in unused electrical outlets.  Install childproof devices to keep doors and windows  secured.  Remove stove knobs or install safety knobs and an automatic shut-off on the stove.  Lower the temperature on water  heaters.  Label medicines and keep them locked up.  Secure knives, lighters, matches, power tools, and guns, and keep these items out of reach.  Keep the house free from clutter. Remove rugs or anything that might contribute to a fall.  Remove objects that might break and hurt the person.  Make sure lighting is good, both inside and outside.  Install grab rails as needed.  Use a monitoring device to alert you to falls or other needs for help.  Reduce confusion.  Keep familiar objects and people around.  Use night lights or dim lights at night.  Label items or areas.  Use reminders, notes, or directions for daily activities or tasks.  Keep a simple, consistent routine for waking, meals, bathing, dressing, and bedtime.  Create a calm, quiet environment.  Place large clocks and calendars prominently.  Display emergency numbers and home address near all telephones.  Use cues to establish different times of the day. An example is to open curtains to let the natural light in during the day.   Use effective communication.  Choose simple words and short sentences.  Use a gentle, calm tone of voice.  Be careful not to interrupt.  If the person is struggling to find a word or communicate a thought, try to provide the word or thought.  Ask one question at a time. Allow the person ample time to answer questions. Repeat the question again if the person does not respond.  Reduce nighttime restlessness.  Provide a comfortable bed.  Have a consistent nighttime routine.  Ensure a regular walking or physical activity schedule. Involve the person in daily activities as much as possible.  Limit napping during the day.  Limit caffeine.  Attend social events that stimulate rather than overwhelm the senses.  Encourage good nutrition and hydration.  Reduce distractions during meal times and snacks.  Avoid foods that are too hot or too cold.  Monitor chewing and swallowing  ability.  Continue with routine vision, hearing, dental, and medical screenings.  Give medicines only as directed by the health care provider.  Monitor driving abilities. Do not allow the person to drive when safe driving is no longer possible.  Register with an identification program which could provide location assistance in the event of a missing person situation. SEEK MEDICAL CARE IF:   New behavioral problems start such as moodiness, aggressiveness, or seeing things that are not there (hallucinations).  Any new problem with brain function happens. This includes problems with balance, speech, or falling a lot.  Problems with swallowing develop.  Any symptoms of other illness happen. Small changes or worsening in any aspect of brain function can be a sign that the illness is getting worse. It can also be a sign of another medical illness such as infection. Seeing a health care provider right away is important. SEEK IMMEDIATE MEDICAL CARE IF:   A fever develops.  New or worsened confusion develops.  New or worsened sleepiness develops.  Staying awake becomes hard to do.   This information is not intended to replace advice given to you by your health care provider. Make sure you discuss any questions you have with your health care provider.   Document Released: 07/01/2000 Document Revised: 01/26/2014 Document Reviewed: 06/02/2010 Elsevier Interactive Patient Education 2016 [employer].  Fatigue Fatigue is feeling tired all of the time, a  lack of energy, or a lack of motivation. Occasional or mild fatigue is often a normal response to activity or life in general. However, long-lasting (chronic) or extreme fatigue may indicate an underlying medical condition. HOME CARE INSTRUCTIONS  Watch your fatigue for any changes. The following actions may help to lessen any discomfort you are feeling:  Talk to your health care provider about how much sleep you need each night. Try to get  the required amount every night.  Take medicines only as directed by your health care provider.  Eat a healthy and nutritious diet. Ask your health care provider if you need help changing your diet.  Drink enough fluid to keep your urine clear or pale yellow.  Practice ways of relaxing, such as yoga, meditation, massage therapy, or acupuncture.  Exercise regularly.   Change situations that cause you stress. Try to keep your work and personal routine reasonable.  Do not abuse illegal drugs.  Limit alcohol intake to no more than 1 drink per day for nonpregnant women and 2 drinks per day for men. One drink equals 12 ounces of beer, 5 ounces of wine, or 1 ounces of hard liquor.  Take a multivitamin, if directed by your health care provider. SEEK MEDICAL CARE IF:   Your fatigue does not get better.  You have a fever.   You have unintentional weight loss or gain.  You have headaches.   You have difficulty:   Falling asleep.  Sleeping throughout the night.  You feel angry, guilty, anxious, or sad.   You are unable to have a bowel movement (constipation).   You skin is dry.   Your legs or another part of your body is swollen.  SEEK IMMEDIATE MEDICAL CARE IF:   You feel confused.   Your vision is blurry.  You feel faint or pass out.   You have a severe headache.   You have severe abdominal, pelvic, or back pain.   You have chest pain, shortness of breath, or an irregular or fast heartbeat.   You are unable to urinate or you urinate less than normal.   You develop abnormal bleeding, such as bleeding from the rectum, vagina, nose, lungs, or nipples.  You vomit blood.   You have thoughts about harming yourself or committing suicide.   You are worried that you might harm someone else.    This information is not intended to replace advice given to you by your health care provider. Make sure you discuss any questions you have with your health  care provider.   Document Released: 11/02/2006 Document Revised: 01/26/2014 Document Reviewed: 05/09/2013 Elsevier Interactive Patient Education 2016 Elsevier Inc[employer] Dehydration Dehydration is when you lose more fluids from the body than you take in. Vital organs such as the kidneys, brain, and heart cannot function without a proper amount of fluids and salt. Any loss of fluids from the body can cause dehydration.  Older adults are at a higher risk of dehydration than younger adults. As we age, our bodies are less able to conserve water and do not respond to temperature changes as well. Also, older adults do not become thirsty as easily or quickly. Because of this, older adults often do not realize they need to increase fluids to avoid dehydration.  CAUSES   Vomiting.  Diarrhea.  Excessive sweating.  Excessive urination.  Fever.  Certain medicines, such as blood pressure medicines called diuretics.  Poorly controlled blood sugars. SIGNS AND SYMPTOMS  Mild dehydration:  Thirst.  Dry lips.  Slightly dry mouth. Moderate dehydration:  Very dry mouth.  Sunken eyes.  Skin does not bounce back quickly when lightly pinched and released.  Dark urine and decreased urine production.  Decreased tear production.  Headache. Severe dehydration:  Very dry mouth.  Extreme thirst.  Rapid, weak pulse (more than 100 beats per minute at rest).  Cold hands and feet.  Not able to sweat in spite of heat.  Rapid breathing.  Blue lips.  Confusion and lethargy.  Difficulty being awakened.  Minimal urine production.  No tears. DIAGNOSIS  Your health care provider will diagnose dehydration based on your symptoms and your exam. Blood and urine tests will help confirm the diagnosis. The diagnostic evaluation should also identify the cause of dehydration. TREATMENT  Treatment of mild or moderate dehydration can often be done at home by increasing the amount of fluids that you  drink. It is best to drink small amounts of fluid more often. Drinking too much at one time can make vomiting worse. Severe dehydration needs to be treated at the hospital. You may be given IV fluids that contain water and electrolytes. HOME CARE INSTRUCTIONS   Ask your health care provider about specific rehydration instructions.  Drink enough fluids to keep your urine clear or pale yellow.  Drink small amounts frequently if you have nausea and vomiting.  Eat as you normally do.  Avoid:  Foods or drinks high in sugar.  Carbonated drinks.  Juice.  Extremely hot or cold fluids.  Drinks with caffeine.  Fatty, greasy foods.  Alcohol.  Tobacco.  Overeating.  Gelatin desserts.  Wash your hands well to avoid spreading bacteria and viruses.  Only take over-the-counter or prescription medicines for pain, discomfort, or fever as directed by your health care provider.  Ask your health care provider if you should continue all prescribed and over-the-counter medicines.  Keep all follow-up appointments with your health care provider. SEEK MEDICAL CARE IF:  You have abdominal pain, and it increases or stays in one area (localizes).  You have a rash, stiff neck, or severe headache.  You are irritable, sleepy, or difficult to awaken.  You are weak, dizzy, or extremely thirsty.  You have a fever. SEEK IMMEDIATE MEDICAL CARE IF:   You are unable to keep fluids down, or you get worse despite treatment.  You have frequent episodes of vomiting or diarrhea.  You have blood or green matter (bile) in your vomit.  You have blood in your stool, or your stool looks black and tarry.  You have not urinated in 6-8 hours, or you have only urinated a small amount of very dark urine.  You faint. MAKE SURE YOU:   Understand these instructions.  Will watch your condition.  Will get help right away if you are not doing well or get worse.   This information is not intended to  replace advice given to you by your health care provider. Make sure you discuss any questions you have with your health care provider.   Document Released: 03/28/2003 Document Revised: 01/10/2013 Document Reviewed: 09/12/2012 Elsevier Interactive Patient Education [employer].

## 2015-03-17 NOTE — ED Notes (Signed)
Pts husband reports that pt has been 'deteriorating' since Friday when she had an infusion for melanoma.  Pt has dementia and pts husband reports that its getting too hard for him to handle.

## 2015-03-17 NOTE — ED Provider Notes (Signed)
CSN: [id number]     Arrival date & time 03/17/15  1418 History  By signing my name below, I, Doran Stabler, attest that this documentation has been prepared under the direction and in the presence of No att. providers found. Electronically Signed: Doran Stabler, ED Scribe. 03/18/2015. 3:11 PM.   Chief Complaint  Patient presents with  . Altered Mental Status   The history is provided by the spouse. The history is limited by the condition of the patient. No language interpreter was used.   HPI Comments: Alexa Hardin is a 82 y.o. female with a PMHx of HTN, melanoma, and dementia who presents to the Emergency Department complaining of a gradually worsening alternate mental status for the past couple of weeks. Husband also complains of gradually increasing weakness, confusion, and "dark urine" today. He would like the pt to be evaluated for a possible kidney infection. Husband denies fever, vomiting, head injury. Pt had her last chemotherapy on Friday, 2 days ago. Husbands states he lives together with the pt in their own home.  Pt with dementia and is frequently confused.   Past Medical History  Diagnosis Date  . Hypertension   . Cancer ([place])     Breast/melanoma  . Dementia    Past Surgical History  Procedure Laterality Date  . Lung removal, partial Left 2013  . Mastectomy Bilateral 2013  . Abdominal wall mass resection  06/15/2012   Family History  Problem Relation Age of Onset  . Cancer Father     lung cancer  . Dementia Neg Hx    Social History  Substance Use Topics  . Smoking status: Former Smoker -- 1.50 packs/day for 30 years    Quit date: 01/20/1983  . Smokeless tobacco: None  . Alcohol Use: 0.6 oz/week    1 Glasses of wine per week     Comment: every a few days    OB History    No data available     Review of Systems  Unable to perform ROS: Dementia  Constitutional: Positive for fatigue. Negative for fever.  Gastrointestinal: Negative for vomiting.   Neurological: Positive for weakness. Negative for headaches.  Psychiatric/Behavioral: Positive for confusion.   Allergies  Quinolones  Home Medications   Prior to Admission medications   Medication Sig Start Date End Date Taking? Authorizing Provider  aspirin 81 MG tablet Take 81 mg by mouth daily.    Historical Provider, MD  atenolol (TENORMIN) 50 MG tablet Take 50 mg by mouth daily.    Historical Provider, MD  Cholecalciferol (VITAMIN D3) 1000 UNITS CAPS Take 1 capsule by mouth daily.    Historical Provider, MD  donepezil (ARICEPT) 10 MG tablet Take 1 tablet (10 mg total) by mouth at bedtime. 11/13/14   Melvenia Beam, MD  letrozole [facility]) 2.5 MG tablet Take 1 tablet (2.5 mg total) by mouth daily. 09/03/14   Truitt Merle, MD  levothyroxine (SYNTHROID, LEVOTHROID) 137 MCG tablet  03/06/15   Historical Provider, MD  levothyroxine (SYNTHROID, LEVOTHROID) 50 MCG tablet Take 50 mcg by mouth daily before breakfast. Reported on 03/08/2015    Historical Provider, MD  LORazepam (ATIVAN) 0.5 MG tablet Reported on 03/04/2015 02/05/15   Historical Provider, MD  losartan (COZAAR) 100 MG tablet Take 100 mg by mouth daily. Reported on 03/08/2015    Historical Provider, MD  megestrol (MEGACE ES) 625 MG/5ML suspension Take 5 mLs (625 mg total) by mouth daily. 09/03/14   Truitt Merle, MD  mirtazapine (REMERON) 15 MG tablet Take  1 tablet (15 mg total) by mouth at bedtime. 03/08/15   Truitt Merle, MD  NIFEDICAL XL 30 MG 24 hr tablet TK 1 T PO D 02/03/15   Historical Provider, MD  potassium chloride SA (K-DUR,KLOR-CON) 20 MEQ tablet Take 20 mEq by mouth daily. Reported on 03/08/2015    Historical Provider, MD  simvastatin (ZOCOR) 40 MG tablet Take 40 mg by mouth daily.    Historical Provider, MD   BP 142/90 mmHg  Pulse 80  Temp(Src) 97.8 F (36.6 C) (Oral)  Resp 20  Ht '5\' 9"'$  (1.753 m)  Wt 118 lb (53.524 kg)  BMI 17.42 kg/m2  SpO2 100%   Physical Exam  Constitutional: No distress.  Frail  HENT:  Head:  Normocephalic and atraumatic.  Cardiovascular: Normal rate and regular rhythm.   No murmur heard. Pulmonary/Chest: Effort normal and breath sounds normal. No respiratory distress.  Abdominal: Soft. There is no tenderness. There is no rebound and no guarding.  Musculoskeletal:  Trace edema bilateral lower extremities  Neurological: She is alert.  Disoriented to place and time. Generalized weakness. Confused.  Skin: Skin is warm and dry.  Psychiatric:  Tangential thought process. Intermittently very calm and very anxious  Nursing note and vitals reviewed.   ED Course  Procedures   DIAGNOSTIC STUDIES: Oxygen Saturation is 98% on room air, normal by my interpretation.    COORDINATION OF CARE: 3:03 PM Will order urinalysis, blood work, and CXR. Discussed treatment plan with pt at bedside and pt agreed to plan.  Labs Review Labs Reviewed  COMPREHENSIVE METABOLIC PANEL - Abnormal; Notable for the following:    Potassium 3.0 (*)    BUN 21 (*)    Calcium 8.8 (*)    All other components within normal limits  URINALYSIS, ROUTINE W REFLEX MICROSCOPIC (NOT AT [facility])  CBC WITH DIFFERENTIAL/PLATELET  CBG MONITORING, ED   Imaging Review Dg Chest 2 View  03/17/2015  CLINICAL DATA:  Patient with worsening dementia. Confused. Prior surgery left lung. EXAM: CHEST  2 VIEW COMPARISON:  CT chest 03/07/2015; chest radiograph 02/11/2015. FINDINGS: Right anterior chest wall Port-A-Cath is present with tip projecting over the right atrium. Stable cardiomegaly. Patient is rotated to the left. Pulmonary hyperinflation. No large area of pulmonary consolidation. Stable biapical pleural parenchymal thickening. No pleural effusion or pneumothorax. Thoracic spine degenerative changes. IMPRESSION: Pulmonary hyperinflation.  No acute cardiopulmonary process. Electronically Signed   By: Lovey Newcomer M.D.   On: 03/17/2015 15:46   Ct Head Wo Contrast  03/17/2015  CLINICAL DATA:  Altered mental status progressive for  several weeks. Weakness and confusion. Hypertension and melanoma. EXAM: CT HEAD WITHOUT CONTRAST TECHNIQUE: Contiguous axial images were obtained from the base of the skull through the vertex without intravenous contrast. COMPARISON:  None. FINDINGS: There is no evidence of intracranial hemorrhage, brain edema, or other signs of acute infarction. There is no evidence of intracranial mass lesion or mass effect. No abnormal extraaxial fluid collections are identified. Moderate diffuse cerebral atrophy and chronic small vessel disease is demonstrated. No skull abnormality identified. IMPRESSION: No acute intracranial abnormality. Moderate diffuse cerebral atrophy and chronic small vessel disease. Electronically Signed   By: Earle Gell M.D.   On: 03/17/2015 17:12   I have personally reviewed and evaluated these images and lab results as part of my medical decision-making.   EKG Interpretation None      MDM   Final diagnoses:  Dementia, with behavioral disturbance  Hypokalemia  Asthenia   Pt with hx/o  melanoma, breast ca, dementia, here with gradual worsening confusion, weakness.  She is nontoxic on exam with no evidence of acute infectious process.  BUN is slightly elevated, she may have some degree of dehydration - provided IV fluid hydration in department.  She has hypokalemia that was replaced in the department orally.  Husband has potassium pills that he has not been giving her at home.  Instructed on giving her potassium at home for the next four days with close outpatient follow up this week for recheck.  UA not c/w UTI- will send cultures.  Discussed with husband home safety given the patient's progressive confusion and debilitation - he currently feels safe with her at home but he does appear fatigued.  Discussed follow up with PCP to begin arranging additional home health vs ALF since patient will continue to need additional assistance over time.  Close return precautions were discussed.   I  personally performed the services described in this documentation, which was scribed in my presence. The recorded information has been reviewed and is accurate.    Quintella Reichert, MD 03/18/15 1253

## 2015-03-17 NOTE — ED Notes (Signed)
Assisted to car via wheelchair x 3 assist, pt redirected multiple times while attempting to get into vehicle , seat belt secured and pt was driving home by husband

## 2015-03-17 NOTE — ED Notes (Signed)
Presents with family who states pt has had a change in mental status, having more confusion, since Friday. Has been more prone to falling.

## 2015-03-17 NOTE — ED Notes (Signed)
Placed on cont POX monitoring with int NBP, CBG assessed.

## 2015-03-17 NOTE — ED Notes (Signed)
FAST assessment; negative at this time.

## 2015-03-18 ENCOUNTER — Other Ambulatory Visit: Payer: Self-pay | Admitting: Hematology

## 2015-03-20 ENCOUNTER — Other Ambulatory Visit: Payer: Self-pay | Admitting: *Deleted

## 2015-03-20 DIAGNOSIS — C799 Secondary malignant neoplasm of unspecified site: Secondary | ICD-10-CM

## 2015-03-20 DIAGNOSIS — C50219 Malignant neoplasm of upper-inner quadrant of unspecified female breast: Secondary | ICD-10-CM

## 2015-03-20 DIAGNOSIS — C439 Malignant melanoma of skin, unspecified: Secondary | ICD-10-CM

## 2015-03-20 MED ORDER — LETROZOLE 2.5 MG PO TABS: 2.5000 mg | ORAL_TABLET | Freq: Every day | ORAL | Status: AC

## 2015-03-22 ENCOUNTER — Telehealth: Payer: Self-pay | Admitting: *Deleted

## 2015-03-22 ENCOUNTER — Ambulatory Visit: Payer: Medicare HMO

## 2015-03-22 ENCOUNTER — Other Ambulatory Visit: Payer: Medicare HMO

## 2015-03-22 ENCOUNTER — Other Ambulatory Visit: Payer: Self-pay | Admitting: *Deleted

## 2015-03-22 NOTE — Telephone Encounter (Signed)
Was informed by Leonard Downing, infusion nurse that pt did not show for lab and chemo today.  Called pt at home and left message requesting husband to call collaborative nurse back.

## 2015-03-22 NOTE — Telephone Encounter (Signed)
Daughter Alexa Hardin returned nurse call.  Was informed that Alexa Hardin had called office and left message with a scheduler 2 days ago requesting all future chemo appts to be cancelled.  Collaborative nurse was not notified.  Per Pam, pt is so weak - very unsteady gait, can only stand up with assistance, very High Fall Risk.  Pt went to ER over the weekend and was given IVF due to dehydration, and low potassium.  Pt has appt with Dr. Alain Marion, internist to help regulate all medications on Tues 3/7;  appt with PCP Dr. Sandi Mariscal on [name] 03/28/15. Alexa Hardin stated she still would like for pt to be followed by Dr. Burr Medico, and any recommendations by md. Pamela's   Phone    [phone].

## 2015-03-26 ENCOUNTER — Emergency Department (HOSPITAL_COMMUNITY)
Admission: EM | Admit: 2015-03-26 | Discharge: 2015-03-26 | Discharge status: Against Medical Advice | Payer: Medicare HMO | Source: Home / Self Care

## 2015-03-26 ENCOUNTER — Ambulatory Visit: Payer: Medicare HMO

## 2015-03-26 ENCOUNTER — Ambulatory Visit (HOSPITAL_COMMUNITY)
Admission: RE | Admit: 2015-03-26 | Discharge: 2015-03-26 | Disposition: A | Discharge status: Home / Self Care | Payer: Medicare HMO | Source: Ambulatory Visit | Attending: Nurse Practitioner | Admitting: Nurse Practitioner

## 2015-03-26 ENCOUNTER — Ambulatory Visit (HOSPITAL_BASED_OUTPATIENT_CLINIC_OR_DEPARTMENT_OTHER): Payer: Medicare HMO

## 2015-03-26 ENCOUNTER — Other Ambulatory Visit: Payer: Self-pay | Admitting: Hematology

## 2015-03-26 ENCOUNTER — Ambulatory Visit (HOSPITAL_BASED_OUTPATIENT_CLINIC_OR_DEPARTMENT_OTHER): Payer: Medicare HMO | Admitting: Nurse Practitioner

## 2015-03-26 VITALS — BP 134/72 | HR 77 | Temp 97.6°F | Resp 18

## 2015-03-26 VITALS — BP 124/52 | HR 77

## 2015-03-26 DIAGNOSIS — F0391 Unspecified dementia with behavioral disturbance: Secondary | ICD-10-CM

## 2015-03-26 DIAGNOSIS — C50912 Malignant neoplasm of unspecified site of left female breast: Secondary | ICD-10-CM

## 2015-03-26 DIAGNOSIS — C439 Malignant melanoma of skin, unspecified: Secondary | ICD-10-CM

## 2015-03-26 DIAGNOSIS — E860 Dehydration: Secondary | ICD-10-CM | POA: Diagnosis present

## 2015-03-26 DIAGNOSIS — K5900 Constipation, unspecified: Secondary | ICD-10-CM | POA: Diagnosis not present

## 2015-03-26 DIAGNOSIS — C50111 Malignant neoplasm of central portion of right female breast: Secondary | ICD-10-CM

## 2015-03-26 DIAGNOSIS — F03918 Unspecified dementia, unspecified severity, with other behavioral disturbance: Secondary | ICD-10-CM

## 2015-03-26 DIAGNOSIS — C50911 Malignant neoplasm of unspecified site of right female breast: Secondary | ICD-10-CM

## 2015-03-26 DIAGNOSIS — R531 Weakness: Secondary | ICD-10-CM | POA: Diagnosis not present

## 2015-03-26 DIAGNOSIS — E46 Unspecified protein-calorie malnutrition: Secondary | ICD-10-CM

## 2015-03-26 DIAGNOSIS — C799 Secondary malignant neoplasm of unspecified site: Secondary | ICD-10-CM

## 2015-03-26 DIAGNOSIS — E8809 Other disorders of plasma-protein metabolism, not elsewhere classified: Secondary | ICD-10-CM

## 2015-03-26 LAB — COMPREHENSIVE METABOLIC PANEL
ALT: 10 U/L (ref 0–55)
ANION GAP: 11 meq/L (ref 3–11)
AST: 21 U/L (ref 5–34)
Albumin: 3.1 g/dL — ABNORMAL LOW (ref 3.5–5.0)
Alkaline Phosphatase: 68 U/L (ref 40–150)
BUN: 20.1 mg/dL (ref 7.0–26.0)
CHLORIDE: 104 meq/L (ref 98–109)
CO2: 26 meq/L (ref 22–29)
CREATININE: 0.8 mg/dL (ref 0.6–1.1)
Calcium: 9.3 mg/dL (ref 8.4–10.4)
EGFR: 66 mL/min/{1.73_m2} — AB (ref >90)
GLUCOSE: 87 mg/dL (ref 70–140)
Potassium: 3.9 mEq/L (ref 3.5–5.1)
SODIUM: 141 meq/L (ref 136–145)
Total Bilirubin: 0.54 mg/dL (ref 0.20–1.20)
Total Protein: 7 g/dL (ref 6.4–8.3)

## 2015-03-26 LAB — CBC WITH DIFFERENTIAL/PLATELET
BASO%: 0.3 % (ref 0.0–2.0)
Basophils Absolute: 0 10*3/uL (ref 0.0–0.1)
EOS%: 1.4 % (ref 0.0–7.0)
Eosinophils Absolute: 0.1 10*3/uL (ref 0.0–0.5)
HCT: 36.3 % (ref 34.8–46.6)
HGB: 11.7 g/dL (ref 11.6–15.9)
LYMPH%: 14.4 % (ref 14.0–49.7)
MCH: 26.4 pg (ref 25.1–34.0)
MCHC: 32.2 g/dL (ref 31.5–36.0)
MCV: 81.8 fL (ref 79.5–101.0)
MONO#: 0.8 10*3/uL (ref 0.1–0.9)
MONO%: 10.9 % (ref 0.0–14.0)
NEUT#: 5.3 10*3/uL (ref 1.5–6.5)
NEUT%: 73 % (ref 38.4–76.8)
PLATELETS: 336 10*3/uL (ref 145–400)
RBC: 4.44 10*6/uL (ref 3.70–5.45)
RDW: 14.8 % — ABNORMAL HIGH (ref 11.2–14.5)
WBC: 7.3 10*3/uL (ref 3.9–10.3)
lymph#: 1.1 10*3/uL (ref 0.9–3.3)

## 2015-03-26 MED ORDER — SODIUM CHLORIDE 0.9 % IV SOLN
INTRAVENOUS | Status: AC
  Administered 2015-03-26: 17:00:00 via INTRAVENOUS

## 2015-03-26 MED ORDER — SODIUM CHLORIDE 0.9% FLUSH
10.0000 mL | INTRAVENOUS | Status: DC | PRN
  Administered 2015-03-26: 10 mL via INTRAVENOUS
  Filled 2015-03-26: qty 10

## 2015-03-26 MED ORDER — HEPARIN SOD (PORK) LOCK FLUSH 100 UNIT/ML IV SOLN
500.0000 [IU] | Freq: Once | INTRAVENOUS | Status: AC
  Administered 2015-03-26: 500 [IU] via INTRAVENOUS
  Filled 2015-03-26: qty 5

## 2015-03-26 MED ORDER — SODIUM CHLORIDE 0.9% FLUSH
10.0000 mL | Freq: Once | INTRAVENOUS | Status: AC
  Administered 2015-03-26: 10 mL via INTRAVENOUS
  Filled 2015-03-26: qty 10

## 2015-03-26 NOTE — ED Notes (Signed)
Called four times in waiting area, no response

## 2015-03-26 NOTE — Patient Instructions (Signed)

## 2015-03-26 NOTE — ED Notes (Signed)
Called and spoke with patient daughter who states that they left "it's ridiculous, we have a team of doctors and shouldn't have to wait".  I had tried to talk with her, but she hung up.

## 2015-03-26 NOTE — ED Notes (Signed)
Patient's daughter came into the ED screaming and yelling that noone came to help her after she came in and asked someone for a wheelchair.   When nurse first became aware that someone was yelling (PEDs area), I went and asked her if I could help.   Daughter was inconsolable and yelling at staff.  I asked daughter to please lower voice.   I went outside and patient didn't want to get out of the car.   I advised family I could not force patient to come with me.  Daughter and husband of patient were able to coerce patient out of car.   Patient was wheeled in.

## 2015-03-27 ENCOUNTER — Ambulatory Visit: Payer: Medicare HMO

## 2015-03-27 ENCOUNTER — Encounter: Payer: Self-pay | Admitting: Nurse Practitioner

## 2015-03-27 DIAGNOSIS — E860 Dehydration: Secondary | ICD-10-CM | POA: Insufficient documentation

## 2015-03-27 DIAGNOSIS — K5900 Constipation, unspecified: Secondary | ICD-10-CM | POA: Insufficient documentation

## 2015-03-27 DIAGNOSIS — E46 Unspecified protein-calorie malnutrition: Secondary | ICD-10-CM | POA: Insufficient documentation

## 2015-03-27 DIAGNOSIS — R531 Weakness: Secondary | ICD-10-CM | POA: Insufficient documentation

## 2015-03-27 NOTE — Assessment & Plan Note (Signed)
Abdomen has decreased down to 3.1.  Patient states that she has minimal appetite and has poor oral intake.  She was encouraged to push protein in her diet is much as possible.

## 2015-03-27 NOTE — Progress Notes (Signed)
SYMPTOM MANAGEMENT CLINIC   HPI: Alexa Hardin 82 y.o. female diagnosed with bilateral breast cancer.  Currently undergoing nivolumab  immunotherapy.  Patient has a history of known dementia/Alzheimer's disease.  Both patient's husband and daughter state that patient was able to perform all of her daily activities and generally take care of herself up until the last few weeks.  Family now states that patient has become much weaker and is unable to ambulate with the assistance of 2 people.  She is actually unable to stand without assistance.  She wears a diaper because she is incontinent of urine.  She typically only urinates in the diaper once per day.  She is constipated and has only had one hard bowel movement within the past 2 weeks.  Patient is also become more focal recently; and yells out inappropriately.  She appears more confused as well.  She has begun having difficulty feeding herself; and is unable to put the spoon into her mouth correctly.  Patient last received her immunotherapy infusion on 3/17 2017.  Both patient's husband and patient's daughter state that patient's dementia.  She has greatly increased; and they feel that the [name] immunotherapy has made no difference in patient's status.  Both patient and her family are in agreement to initiate hospice in their home as soon as possible.   Have reviewed all with Dr. Burr Medico as well; and she is in agreement with hospice order as well.  Patient's family are very interested in some respite care; and plan to discuss this option with hospice as well.    HPI  Review of Systems  Constitutional: Positive for malaise/fatigue. Negative for fever and chills.  Neurological: Positive for weakness.       Apparent increased/progressive Alzheimer/dementia.  All other systems reviewed and are negative.   Past Medical History  Diagnosis Date  . Hypertension   . Cancer ([place])     Breast/melanoma  . Dementia     Past Surgical  History  Procedure Laterality Date  . Lung removal, partial Left 2013  . Mastectomy Bilateral 2013  . Abdominal wall mass resection  06/15/2012    has Malignant melanoma of unknown origin ([place]); Breast cancer, right breast ([place]); Breast cancer, left breast ([place]); Dementia with behavioral disturbance; Alzheimer's disease; COPD (chronic obstructive pulmonary disease) ([place]); Acute kidney injury ([place]); Hypothyroidism; Aspiration pneumonia ([place]); Dehydration; Weakness; Constipation; and Hypoalbuminemia due to protein-calorie malnutrition ([place]) on her problem list.    is allergic to quinolones.    Medication List       This list is accurate as of: 03/26/15 11:59 PM.  Always use your most recent med list.               aspirin 81 MG tablet  Take 81 mg by mouth daily.     atenolol 50 MG tablet  Commonly known as:  TENORMIN  Take 50 mg by mouth daily.     donepezil 10 MG tablet  Commonly known as:  ARICEPT  Take 1 tablet (10 mg total) by mouth at bedtime.     letrozole 2.5 MG tablet  Commonly known as:  FEMARA  Take 1 tablet (2.5 mg total) by mouth daily.     levothyroxine 50 MCG tablet  Commonly known as:  SYNTHROID, LEVOTHROID  Take 50 mcg by mouth daily before breakfast. Reported on 03/08/2015     levothyroxine 137 MCG tablet  Commonly known as:  SYNTHROID, LEVOTHROID     LORazepam 0.5 MG tablet  Commonly  known as:  ATIVAN  Reported on 03/04/2015     losartan 100 MG tablet  Commonly known as:  COZAAR  Take 100 mg by mouth daily. Reported on 03/08/2015     megestrol 625 MG/5ML suspension  Commonly known as:  MEGACE ES  Take 5 mLs (625 mg total) by mouth daily.     mirtazapine 15 MG tablet  Commonly known as:  REMERON  Take 1 tablet (15 mg total) by mouth at bedtime.     NIFEDICAL XL 30 MG 24 hr tablet  Generic drug:  NIFEdipine  TK 1 T PO D     potassium chloride SA 20 MEQ tablet  Commonly known as:  K-DUR,KLOR-CON  Take 20 mEq by mouth daily. Reported on 03/08/2015      simvastatin 40 MG tablet  Commonly known as:  ZOCOR  Take 40 mg by mouth daily.     Vitamin D3 1000 units Caps  Take 1 capsule by mouth daily.         PHYSICAL EXAMINATION  Oncology Vitals 03/26/2015 03/26/2015  Height - -  Weight - (No Data)  Weight (lbs) - (No Data)  BMI (kg/m2) - -  Temp - 97.6  Pulse 77 77  Resp - 18  SpO2 - 99  BSA (m2) - -   BP Readings from Last 2 Encounters:  03/26/15 124/52  03/26/15 134/72    Physical Exam  Constitutional: Vital signs are normal. She appears malnourished and dehydrated. She appears unhealthy. She appears cachectic.  HENT:  Head: Normocephalic and atraumatic.  Mouth/Throat: Oropharynx is clear and moist.  Eyes: Conjunctivae and EOM are normal. Pupils are equal, round, and reactive to light. Right eye exhibits no discharge. Left eye exhibits no discharge. No scleral icterus.  Neck: Normal range of motion. Neck supple. No JVD present. No tracheal deviation present. No thyromegaly present.  Cardiovascular: Normal rate, regular rhythm, normal heart sounds and intact distal pulses.   Pulmonary/Chest: Effort normal and breath sounds normal. No respiratory distress. She has no wheezes. She has no rales. She exhibits no tenderness.  Abdominal: Soft. Bowel sounds are normal. She exhibits no distension and no mass. There is no tenderness. There is no rebound and no guarding.  Musculoskeletal: Normal range of motion. She exhibits no edema or tenderness.  Lymphadenopathy:    She has no cervical adenopathy.  Neurological:  Patient was able to answer some simple questions and follows some simple commands only.  At other times she appeared very confused and was talking incoherently.  Skin: Skin is warm and dry. No rash noted. No erythema. There is pallor.    LABORATORY DATA:. Appointment on 03/26/2015  Component Date Value Ref Range Status  . WBC 03/26/2015 7.3  3.9 - 10.3 10e3/uL Final  . NEUT# 03/26/2015 5.3  1.5 - 6.5 10e3/uL Final    . HGB 03/26/2015 11.7  11.6 - 15.9 g/dL Final  . HCT 03/26/2015 36.3  34.8 - 46.6 % Final  . Platelets 03/26/2015 336  145 - 400 10e3/uL Final  . MCV 03/26/2015 81.8  79.5 - 101.0 fL Final  . MCH 03/26/2015 26.4  25.1 - 34.0 pg Final  . MCHC 03/26/2015 32.2  31.5 - 36.0 g/dL Final  . RBC 03/26/2015 4.44  3.70 - 5.45 10e6/uL Final  . RDW 03/26/2015 14.8* 11.2 - 14.5 % Final  . lymph# 03/26/2015 1.1  0.9 - 3.3 10e3/uL Final  . MONO# 03/26/2015 0.8  0.1 - 0.9 10e3/uL Final  . Eosinophils Absolute  03/26/2015 0.1  0.0 - 0.5 10e3/uL Final  . Basophils Absolute 03/26/2015 0.0  0.0 - 0.1 10e3/uL Final  . NEUT% 03/26/2015 73.0  38.4 - 76.8 % Final  . LYMPH% 03/26/2015 14.4  14.0 - 49.7 % Final  . MONO% 03/26/2015 10.9  0.0 - 14.0 % Final  . EOS% 03/26/2015 1.4  0.0 - 7.0 % Final  . BASO% 03/26/2015 0.3  0.0 - 2.0 % Final  . Sodium 03/26/2015 141  136 - 145 mEq/L Final  . Potassium 03/26/2015 3.9  3.5 - 5.1 mEq/L Final  . Chloride 03/26/2015 104  98 - 109 mEq/L Final  . CO2 03/26/2015 26  22 - 29 mEq/L Final  . Glucose 03/26/2015 87  70 - 140 mg/dl Final   Glucose reference range is for nonfasting patients. Fasting glucose reference range is 70- 100.  Marland Kitchen BUN 03/26/2015 20.1  7.0 - 26.0 mg/dL Final  . Creatinine 03/26/2015 0.8  0.6 - 1.1 mg/dL Final  . Total Bilirubin 03/26/2015 0.54  0.20 - 1.20 mg/dL Final  . Alkaline Phosphatase 03/26/2015 68  40 - 150 U/L Final  . AST 03/26/2015 21  5 - 34 U/L Final  . ALT 03/26/2015 10  0 - 55 U/L Final  . Total Protein 03/26/2015 7.0  6.4 - 8.3 g/dL Final  . Albumin 03/26/2015 3.1* 3.5 - 5.0 g/dL Final  . Calcium 03/26/2015 9.3  8.4 - 10.4 mg/dL Final  . Anion Gap 03/26/2015 11  3 - 11 mEq/L Final  . EGFR 03/26/2015 66* >90 ml/min/1.73 m2 Final   eGFR is calculated using the CKD-EPI Creatinine Equation (2009)     RADIOGRAPHIC STUDIES: Dg Abd 1 View  03/26/2015  CLINICAL DATA:  Constipation, dehydration, history hypertension, breast cancer, dementia  EXAM: ABDOMEN - 1 VIEW COMPARISON:  None FINDINGS: Lung bases clear. Cardiac silhouette appears enlarged. Increased stool in colon. No evidence of bowel obstruction, bowel wall thickening or free air. Bones demineralized with levoconvex thoracolumbar scoliosis. Post RIGHT hip arthroplasty. IMPRESSION: Increased stool in colon. Electronically Signed   By: Lavonia Dana M.D.   On: 03/26/2015 17:07    ASSESSMENT/PLAN:    Weakness Per both husband and daughter-patient has become much more fatigued and generally weak.  Within the past few weeks.  She is now unable to ambulate without assistance.  She is a full lift that requires 2 people to help her to the bathroom.   See further notes for other new or more progressive complaints discussed today.  Both patient and her family were in agreement to move forward with initiation of hospice in their home.  Advised patient and her family that hospice come in and do a full home evaluation to see what their needs are.  Hypoalbuminemia due to protein-calorie malnutrition ([place]) Abdomen has decreased down to 3.1.  Patient states that she has minimal appetite and has poor oral intake.  She was encouraged to push protein in her diet is much as possible.  Dementia with behavioral disturbance Patient has a history of known dementia/Alzheimer's disease.  Both patient's husband and daughter state that patient was able to perform all of her daily activities and generally take care of herself up until the last few weeks.  Family now states that patient has become much weaker and is unable to ambulate with the assistance of 2 people.  She is actually unable to stand without assistance.  She wears a diaper because she is incontinent of urine.  She typically only urinates in  the diaper once per day.  She is constipated and has only had one hard bowel movement within the past 2 weeks.  Patient is also become more focal recently; and yells out inappropriately.  She appears more  confused as well.  She has begun having difficulty feeding herself; and is unable to put the spoon into her mouth correctly.  Patient last received her immunotherapy infusion on 3/17 2017.  Both patient's husband and patient's daughter state that patient's dementia.  She has greatly increased; and they feel that the [name] immunotherapy has made no difference in patient's status.  Both patient and her family are in agreement to initiate hospice in their home as soon as possible.   Have reviewed all with Dr. Burr Medico as well; and she is in agreement with hospice order as well.  Patient's family are very interested in some respite care; and plan to discuss this option with hospice as well.   Dehydration Patient has had minimal appetite and decreased oral intake recently.  She appears slightly dehydrated.  She will receive IV fluid rehydration while cancer Center today.  She was encouraged to push fluids at home as well.  Constipation Patient's family report the patient has had only one very hard, formed bowel movement within the past 2 weeks.  Patient has progressive dementia/Alzheimer; and family feels that patient has been holding any bowel movements due to her confusion.  They have not tried any over-the-counter stool softeners or laxatives as of yet.  Exam today reveals abdomen soft.  Bowel sounds positive in all 4 quads.  There was no tenderness to the abdomen.  X-ray obtained today of the abdomen revealed no bowel obstruction; but continued constipation.  Patient was advised to try stool softeners twice daily and Miralax every 6 hours until constipation is clear.  Breast cancer, right breast [facility]) Patient has been diagnosed with bilateral breast cancer.  She last received the [name] immunotherapy infusion on 3/17 2017.  Patient's family report, the patient's dementia has greatly progressed within the last few weeks; the patient requires full assistance with any ambulation or daily  activities now.  Patient is also taking in less oral intake and continues with urine incontinence.  Patient has been constipated and has only had one bowel movement within the past 2 weeks.  Patient has had no recent fevers or chills per her family's report.  After long discussion with both the patient and her family members-decision was made to move forward with the hospice order for in-home care.  See further notes for details.  Will cancel labs and the follow-up visit that was scheduled for 04/05/2015.  Will keep the Port-A-Cath flushed appointment that was scheduled for 04/05/2015 for the time being-until we are notified by the hospice team regarding Port-A-Cath care.   This provider called [facility] and spoke to [place]- who confirmed that Hospice would initiate a home visit ASAP.    Patient stated understanding of all instructions; and was in agreement with this plan of care. The patient knows to call the clinic with any problems, questions or concerns.   Review/collaboration with Dr. Burr Medico regarding all aspects of patient's visit today.   Total time spent with patient was 40 minutes;  with greater than 75 percent of that time spent in face to face counseling regarding patient's symptoms,  and coordination of care and follow up.  Disclaimer:This dictation was prepared with Dragon/digital dictation along with [employer]. Any transcriptional errors that result from this process are unintentional.  Drue Second, NP 03/27/2015

## 2015-03-27 NOTE — Assessment & Plan Note (Signed)
Patient has been diagnosed with bilateral breast cancer.  She last received the [name] immunotherapy infusion on 3/17 2017.  Patient's family report, the patient's dementia has greatly progressed within the last few weeks; the patient requires full assistance with any ambulation or daily activities now.  Patient is also taking in less oral intake and continues with urine incontinence.  Patient has been constipated and has only had one bowel movement within the past 2 weeks.  Patient has had no recent fevers or chills per her family's report.  After long discussion with both the patient and her family members-decision was made to move forward with the hospice order for in-home care.  See further notes for details.  Will cancel labs and the follow-up visit that was scheduled for 04/05/2015.  Will keep the Port-A-Cath flushed appointment that was scheduled for 04/05/2015 for the time being-until we are notified by the hospice team regarding Port-A-Cath care.

## 2015-03-27 NOTE — Assessment & Plan Note (Signed)
Patient has had minimal appetite and decreased oral intake recently.  She appears slightly dehydrated.  She will receive IV fluid rehydration while cancer Center today.  She was encouraged to push fluids at home as well.

## 2015-03-27 NOTE — Assessment & Plan Note (Signed)
Per both husband and daughter-patient has become much more fatigued and generally weak.  Within the past few weeks.  She is now unable to ambulate without assistance.  She is a full lift that requires 2 people to help her to the bathroom.   See further notes for other new or more progressive complaints discussed today.  Both patient and her family were in agreement to move forward with initiation of hospice in their home.  Advised patient and her family that hospice come in and do a full home evaluation to see what their needs are.

## 2015-03-27 NOTE — Assessment & Plan Note (Signed)
Patient has a history of known dementia/Alzheimer's disease.  Both patient's husband and daughter state that patient was able to perform all of her daily activities and generally take care of herself up until the last few weeks.  Family now states that patient has become much weaker and is unable to ambulate with the assistance of 2 people.  She is actually unable to stand without assistance.  She wears a diaper because she is incontinent of urine.  She typically only urinates in the diaper once per day.  She is constipated and has only had one hard bowel movement within the past 2 weeks.  Patient is also become more focal recently; and yells out inappropriately.  She appears more confused as well.  She has begun having difficulty feeding herself; and is unable to put the spoon into her mouth correctly.  Patient last received her immunotherapy infusion on 3/17 2017.  Both patient's husband and patient's daughter state that patient's dementia.  She has greatly increased; and they feel that the [name] immunotherapy has made no difference in patient's status.  Both patient and her family are in agreement to initiate hospice in their home as soon as possible.   Have reviewed all with Dr. Burr Medico as well; and she is in agreement with hospice order as well.  Patient's family are very interested in some respite care; and plan to discuss this option with hospice as well.

## 2015-03-27 NOTE — Assessment & Plan Note (Signed)
Patient's family report the patient has had only one very hard, formed bowel movement within the past 2 weeks.  Patient has progressive dementia/Alzheimer; and family feels that patient has been holding any bowel movements due to her confusion.  They have not tried any over-the-counter stool softeners or laxatives as of yet.  Exam today reveals abdomen soft.  Bowel sounds positive in all 4 quads.  There was no tenderness to the abdomen.  X-ray obtained today of the abdomen revealed no bowel obstruction; but continued constipation.  Patient was advised to try stool softeners twice daily and Miralax every 6 hours until constipation is clear.

## 2015-04-02 ENCOUNTER — Telehealth: Payer: Self-pay | Admitting: *Deleted

## 2015-04-02 NOTE — Telephone Encounter (Signed)
TC from [place], Therapist, sports. She is returning a call.

## 2015-04-02 NOTE — Telephone Encounter (Signed)
Spoke with Mendel Ryder, RN @ [facility] and gave her verbal orders from Dr. Burr Medico re: 1.    DNR -  Hospice physician to sign. 2.    Change Ativan  0.5 - 1 mg every 4 hours as needed for agitation. 3.    Hospice physicians to assist with symptoms management for pt. Mendel Ryder voiced understanding. [facility]   Phone      [phone].

## 2015-04-03 ENCOUNTER — Telehealth: Payer: Self-pay | Admitting: *Deleted

## 2015-04-03 NOTE — Telephone Encounter (Signed)
Received call from [place], [place] @ [place] wanting to inform Dr. Burr Medico re:  Pt has had inappropriate outburst and has increased agitation despite Ativan.   Dr. Lyman Speller from [facility] would like for pt to start Seroquel 25 mg po BID,  But needs clarification by Dr. Burr Medico.   Dr. Burr Medico notified.  Spoke with Mendel Ryder and informed her that it is ok for pt to take Seroquel as per Dr. Burr Medico. [facility]   Phone    [phone].

## 2015-04-04 ENCOUNTER — Telehealth: Payer: Self-pay | Admitting: *Deleted

## 2015-04-04 NOTE — Telephone Encounter (Signed)
Received call from [facility], RN @ [place] wanting to know re:  Pt is currently taking K-dur 20 meq tablet daily.  Angela Nevin wanting to know if it is ok with Dr. Burr Medico to change medication to liquid form -  15 meq/ml.    Dr. Burr Medico notified.   Spoke with Angela Nevin again and informed her that it is ok with Dr. Burr Medico to change to liquid form ;  However, if pt cannot tolerated tablet nor liquid potassium, ok to omit medication. Carla voiced understanding. [facility]   Phone     [phone].

## 2015-04-05 ENCOUNTER — Ambulatory Visit: Payer: Medicare HMO

## 2015-04-05 ENCOUNTER — Other Ambulatory Visit: Payer: Medicare HMO

## 2015-04-05 ENCOUNTER — Ambulatory Visit: Payer: Medicare HMO | Admitting: Hematology

## 2015-04-05 ENCOUNTER — Other Ambulatory Visit: Payer: Self-pay | Admitting: Hematology

## 2015-04-12 ENCOUNTER — Telehealth: Payer: Self-pay | Admitting: *Deleted

## 2015-04-12 NOTE — Telephone Encounter (Signed)
Received call from [place], RN @ [facility] informing Dr. Burr Medico that pt's family was concerned about pt having constipation.  Per Mendel Ryder, pt did have a good BM on Sunday; Mendel Ryder was not sure if pt has had any this week.  Pt is taking Stool softener daily.  Mendel Ryder wanted to know if stool softener could be increased to Twice daily.   Called Mendel Ryder back and left message on voice mail re: requesting Mendel Ryder to ask Hospice physicians to assist with symptoms  management for pt and to give Dr. Burr Medico updates. [facility]   Phone    [phone].

## 2015-04-24 ENCOUNTER — Other Ambulatory Visit: Payer: Self-pay | Admitting: Hematology

## 2015-04-30 ENCOUNTER — Telehealth: Payer: Self-pay | Admitting: *Deleted

## 2015-04-30 NOTE — Telephone Encounter (Signed)
Received call from [place], [place] @ [place] wanting to talk to nurse about pt's behaviors - outburst, agitation.   Ria Comment stated she had spoken with Dr. Lyman Speller, medical director at [facility].  Ria Comment was instructed to contact Dr. Burr Medico office for further instructions by Dr. Lyman Speller. Attempted to call Ria Comment back unsuccessfully.  Left message on voice mail requesting a call back from [place]. [facility]   Phone      [phone].

## 2015-05-09 ENCOUNTER — Telehealth: Payer: Self-pay | Admitting: *Deleted

## 2015-05-09 ENCOUNTER — Telehealth: Payer: Self-pay

## 2015-05-09 NOTE — Telephone Encounter (Signed)
Call received from patient's daughter Nelson Chimes.  "I need an appointment for mom to be seen by Dr. Burr Medico.  Having repetitive problems with hospice nurse who doesn't seem to be able to answer questions.  Not sure if mom will re-qualify for hospice after 90-days.  She looses consciousness without any reason.  Called ambulance and they have to give her oxygen because she's out 10-20 minutes.  Hospice provider has not come to the home and I do not think they're interested.  When will I receive a call?  I started this process this morning at 0800 am leaving a message on the schedulers line and it should not take more than 24 hrs to receive an appointment."   Advised hospice nurse has called.  Dr. Burr Medico has made changes to medications and Neurologist needs to be contacted.  "The neurologist is not relevant.  She's seen neurologist twice, months ago.  She does not need to see Hospice provider or neurologist.  She needs to see Dr. Burr Medico who referred her to Hospice."   Advised at 4:00 pm, this nurse will document our conversation to notify doctor who will just be receiving this request for the first time at this time.  Apologized for any inconvenience but schedulers cannot schedule without doctors orders. Return number [phone].

## 2015-05-09 NOTE — Telephone Encounter (Signed)
Alexa Hardin with [facility] called requesting call back about pt behaviors. She is needing help with symptom management.

## 2015-05-09 NOTE — Telephone Encounter (Signed)
I called pt's daughter back and left a message regarding her mother's appointment. My nurse Janifer Adie has spoken with her hospice nurse several times today regarding her condition and medication adjustment. I did not know her daughter wanted to bring her mom in. I would accommodate an appointment for them tomorrow. I encouraged her to call me in the early morning and we'll schedule her to be seen.  Truitt Merle  05/09/2015

## 2015-05-09 NOTE — Telephone Encounter (Signed)
Received vm call from [place]/RN/Hospice/[facility] stating that pt is having some behavior issues & need some help with symptom management.  Returned call & left message to call back. Ria Comment called back & states that [facility] is not the typical hospice pt & req some info on pt.  She reports that pt has had some behavior issues with some outburst, fighting, yelling, screaming & they have added Seroquel 25 mg bid & ativan 0.5 mg q 4 hours & she is on Remeron at hs with some help. She has also had some 3 episodes of being unresponsive for 10-20" with no changes in VS & no effects noted afterward.  Discussed with Dr Burr Medico & she suggested haldol 0.5 mg q 8 hr prn but would need to stop Seroquel & the unresponsive episodes could be from medication & to be careful not to over sedate.   Also suggested that she f/u with [facility].  Ria Comment req OV notes.  Last 3 visits faxed to [phone]

## 2015-05-10 ENCOUNTER — Ambulatory Visit (HOSPITAL_BASED_OUTPATIENT_CLINIC_OR_DEPARTMENT_OTHER): Admitting: Hematology

## 2015-05-10 ENCOUNTER — Other Ambulatory Visit: Payer: Self-pay | Admitting: Hematology

## 2015-05-10 ENCOUNTER — Encounter: Payer: Self-pay | Admitting: Hematology

## 2015-05-10 ENCOUNTER — Encounter: Payer: Self-pay | Admitting: *Deleted

## 2015-05-10 VITALS — BP 147/64 | HR 98 | Temp 98.5°F | Resp 18 | Ht 69.0 in | Wt 115.4 lb

## 2015-05-10 DIAGNOSIS — C50912 Malignant neoplasm of unspecified site of left female breast: Secondary | ICD-10-CM

## 2015-05-10 DIAGNOSIS — C439 Malignant melanoma of skin, unspecified: Secondary | ICD-10-CM

## 2015-05-10 DIAGNOSIS — F0391 Unspecified dementia with behavioral disturbance: Secondary | ICD-10-CM | POA: Diagnosis not present

## 2015-05-10 DIAGNOSIS — R531 Weakness: Secondary | ICD-10-CM

## 2015-05-10 DIAGNOSIS — F03918 Unspecified dementia, unspecified severity, with other behavioral disturbance: Secondary | ICD-10-CM

## 2015-05-10 DIAGNOSIS — Z79811 Long term (current) use of aromatase inhibitors: Secondary | ICD-10-CM

## 2015-05-10 DIAGNOSIS — E46 Unspecified protein-calorie malnutrition: Secondary | ICD-10-CM | POA: Diagnosis not present

## 2015-05-10 DIAGNOSIS — C50911 Malignant neoplasm of unspecified site of right female breast: Secondary | ICD-10-CM

## 2015-05-10 NOTE — Progress Notes (Signed)
[place] Comprehensive Psychosocial Assessment Clinical Social Work  Clinical Social Work was referred by medical oncologist due to caregiver concerns. Pt currently receiving hospice services and family requested pt be seen today.    Clinical Social Worker met with patient, husband and daughter in the exam room to assess psychosocial needs of the patient and family.  Patient's knowledge about cancer and its treatment including level of understanding, reactions, goals for care, and expectations: Pt currently not in active treatment and is being followed by [facility] currently. Pt appears to have advanced dementia and is not able to participate in meaningful discussion. Daughter very concerned about losing their hospice services and she seems very focused on that and meeting increasing care needs. CSW attempted to explain [facility] would not leave them with no plan.   Characteristics of the patient's support system: Pt lives in an In-law suite with her husband in her daughter's home out in [place] area. Daughter, her husband and two teenage sons recently moved to the area in the last year and have a limited support network in the area.  CSW encouraged daughter and pt's husband to consider caregiver support options in the local area; such as [facility]. They were previously recommended to pursue, but have not been able to follow up to date. CSW strongly encouraged this and provided handouts today.   Patient and family psychosocial functioning including strengths, limitations, and coping skills: Family appears reluctant to clearly ask for help and would greatly benefit from additional education, support and assistance with caregiving. CSW educated family today that no one can help with a need, if they don't know about the said need. CSW encouraged them to reach out to their [facility] CSW, Marilynne Halsted for additional assistance. Family currently has a CNA several hours a week that is charging a very affordable rate. CSW educated  family on typical costs related to these services. Family aware they are getting a good option and are considering expansion of these services.   Identifications of barriers to care: Family appears overwhelmed and somewhat in crisis mode, they would benefit greatly from additional education around dementia related behaviors. Pt eager to find her her purse today when leaving the appt, but family stated she no longer needed a purse. CSW attempted to educate family that this behavior is very appropriate as pt has always had a purse and the appropriate behavior is to look for said purse at leaving an establishment. The purse could just have tissues in it if that provides comfort to pt.  Cost of placement in ALF and/or additional CNA support seems to be a barrier for family. They brought LTC policy to fill out today by medical team. However, current caregivers through [facility] are best to assist in addressing that need.   Availability of community resources: Family has a great team through [facility], but need to use their resources. Family greatly needs education and additional support around caregiving concerns. CSW phoned both [facility] RN and CSW to update them on current needs and barriers.   Clinical Social Worker follow up needed: Yes.    If yes, follow up plan: CSW will continue to be a resource to pt and family. Will also work alongside [facility] team to best support family.   Loren Racer, Geraldine Worker [place]  [place] Phone: [phone] Fax: [phone]

## 2015-05-10 NOTE — Progress Notes (Signed)
[place]  [phone] [phone]  Clinic follow up Note   Patient Care Team: Derinda Late, MD as PCP - General (Family Medicine) Truitt Merle, MD as Consulting Physician (Hematology) 05/10/2015  CHIEF COMPLAINTS:  Follow up metastatic melanoma  Oncology History   Metastatic melanoma   Staging form: Melanoma of the Skin, AJCC 7th Edition     Clinical: Stage IV ([place], NX, M1b) - Unsigned          Malignant melanoma of unknown origin ([place])   11/03/2011 Imaging PET CT scan showed hypermetabolic nodule in the lingular, 3 cm, consistent with known metastatic disease, hypermetabolic focus in the medial right thigh, without visible lesion on CT. Mild activity in anterior lateral chest wall favoring postsurgical    2013 Initial Diagnosis Metastatic melanoma   10/21/2011 Initial Biopsy Left lung mass biopsy, tumor is present suggests melanoma.   12/02/2011 Surgery Left alone mass which resection, metastatic melanoma, parenchymal margin negative. Lingular mass, negative margins. [place] staining consistent with melanoma.   01/15/2012 Miscellaneous BRAF and c-kit mutation and lysis were negative   06/29/2012 -  Chemotherapy Yervoy, followed by [facility] Flock, and currently on Opdivo    11/21/2013 Surgery Resection of metastatic melanoma in right lateral back and right upper arm, past was consistent with melanoma. Margins were negative.   05/17/2014 Imaging Brain MRI showed diffuse cerebral atrophy with superimposed small vessel ischemia disease stable from prior exam. No evidence of intracranial metastasis.   05/17/2014 Imaging CT of the chest, abdomen and pelvis showed no findings to suggest metastatic disease. Decreased in size of perifascial fat of the right flank, previous right pelvic node no longer seen.   11/16/2014 Imaging PET scan showed no conviencing evidence of disese   01/29/2015 - 02/01/2015 Hospital Admission admitted for CAP     Breast cancer, right  breast ([place])   09/26/1999 Initial Diagnosis Right breast lumpectomy showed infiltrating lobular carcinoma, grade 2, T2N1, surgical margins were negative. Laboratory carcinoma in situ.   09/26/1999 Receptors her2 ER 70% positive, PR 10% positive, HER-2 negative.   2001 -  Adjuvant Chemotherapy Adjuvant Adriamycin, Cytoxan and Taxol.   2001 -  Radiation Therapy Adjuvant breast radiation   2002 - 2007 Anti-estrogen oral therapy Tamoxifen for one year, followed by Arimidex which was later on switched to Aromasin due to side effects, for total 5 years.    Breast cancer, left breast ([place])   05/28/2011 Initial Diagnosis Breast cancer, left breast   05/28/2011 Initial Biopsy Left breast at 12:00 position biopsy showed ductal carcinoma in situ high-grade, left breast 3 clock position biopsy showed infiltrating lobular carcinoma.   05/28/2011 Receptors her2 ER positive, PR positive, HER-2 negative. Oncotype recurrence score 13   06/19/2011 Surgery Right breast exertion, no cancer. Sentinel lymph node negative. Left breast exertion showed focal invasive lobular carcinoma 8 mm maximal dimension. Grade 1, DCIS present. Total 41 is negative.   HISTORY OF PRESENTING ILLNESS:  Jannessa Lundahl 82 y.o. female is here to transfer her oncological care to [place] due to the recent relocation. She is accompanied by her husband and daughter to the clinic today.  Please see the summary for her oncological issue above. In summary, she had a stage II of right breast lobular carcinoma in 2001, and stage I left breast lobular carcinoma in 2013, and currently on adjuvant Femara. She was diagnosed with metastatic melanoma to lung, subcutaneous and the lymph nodes in October 2013, status post surgical resection of the lung metastases, and to  subcutaneous lesion. She has been on immunotherapy since then, including YervoyX4, followed by [facility], and currently on [name] Sep 2015. She has been tolerating treatment very well, with only mild  fatigue, no significant other side effects.  She used to live with her husband in [place], was recently moved to [place] nose: To be with her daughter and her family. Her family members have noticed worsening memory loss, some cognitive dysfunction, and some personality change, such as anger control, in the past several months. One day a few weeks ago, she was found wandering in the parking lot outside of her hotel room at 4:00 in the morning. She needs supervision for her daily activities, such as showering, taking medication. Patient denies any of the above issues, but her conversation was disorganized, and had significant short-term memory issue when I tested her.  She has low to moderate appetite ,  on Megace, eats small portion of female, she lost about 20 lbs in the past two years, No significant weight loss lately. She denies any significant pain, dyspnea, abdominal discomfort, or change of her bowel habits. She is able to ambulatory independently without difficulty. She is not very physically active, but able to tolerate daily routine activity.   CURRENT THERAPY: hospice care   INTERIM HISTORY Pt's daughter called yesterday, she was concerned about her episodes of unconsciousness, and pt was brought to my clinic today by her husband and daughter. She has been under hospice care since 03/28/2015. She overall has been weaker, less active, sleeps more. She occasionally has burst of combative behavior, yielding, etc, especially when she is asked to take a shower by her home acid. She has 3 episode of sudden onset lose of consciousness in the past few weeks, which were witnessed by her family member and hospice nurse. Her VS was stable during these episodes, breathing slightly heavy, no fall or injury. Family called EMS the first time, and she woke up spontaneously, and did not go to ED. Her appetite is slightly lower, but overall eats well.   MEDICAL HISTORY:  Past Medical History  Diagnosis  Date  . Hypertension   . Cancer ([place])     Breast/melanoma  . Dementia     SURGICAL HISTORY: Past Surgical History  Procedure Laterality Date  . Lung removal, partial Left 2013  . Mastectomy Bilateral 2013  . Abdominal wall mass resection  06/15/2012    SOCIAL HISTORY: Social History   Social History  . Marital Status: Married    Spouse Name: Deidre Ala  . Number of Children: 1  . Years of Education: 16   Occupational History  . 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 Smoking status: Former Smoker -- 1.50 packs/day for 30 years    Quit date: 01/20/1983  . Smokeless tobacco: Not on file  . Alcohol Use: 0.6 oz/week    1 Glasses of wine per week     Comment: every a few days   . Drug Use: No  . Sexual Activity: Not on file   Other Topics Concern  . Not on file   Social History Narrative   Lives at home with husband and daughter/son-in law   Caffeine use: 1 cup per day    FAMILY HISTORY: Family History  Problem Relation Age of Onset  . Cancer Father     lung cancer  . Dementia Neg Hx     ALLERGIES:  is allergic to quinolones.  MEDICATIONS:  Current  Outpatient Prescriptions  Medication Sig Dispense Refill  . aspirin 81 MG tablet Take 81 mg by mouth daily.    Marland Kitchen atenolol (TENORMIN) 50 MG tablet Take 50 mg by mouth daily.    . Cholecalciferol (VITAMIN D3) 1000 UNITS CAPS Take 1 capsule by mouth daily.    Marland Kitchen levothyroxine (SYNTHROID, LEVOTHROID) 137 MCG tablet Reported on 05/10/2015    . LORazepam (ATIVAN) 0.5 MG tablet Reported on 03/04/2015  5  . mirtazapine (REMERON) 15 MG tablet TAKE 1 TABLET BY MOUTH AT BEDTIME 30 tablet 0  . NIFEDICAL XL 30 MG 24 hr tablet TK 1 T PO D  1  . potassium chloride (KLOR-CON) 20 MEQ packet Take 30 mEq by mouth daily.  1  . potassium chloride SA (K-DUR,KLOR-CON) 20 MEQ tablet Take 20 mEq by mouth daily. Reported on 03/08/2015    . letrozole (FEMARA) 2.5 MG tablet Take 1 tablet (2.5 mg total) by mouth daily.  (Patient not taking: Reported on 05/10/2015) 30 tablet 5  . megestrol (MEGACE ES) 625 MG/5ML suspension Take 5 mLs (625 mg total) by mouth daily. (Patient not taking: Reported on 03/26/2015) 150 mL 3  . QUEtiapine (SEROQUEL) 25 MG tablet Take 25 mg by mouth 2 (two) times daily.  2  . SENOKOT S 8.6-50 MG tablet Take 1 tablet by mouth daily as needed.  2  . sorbitol 70 % solution Take 30 mLs by mouth daily.  2   No current facility-administered medications for this visit.    REVIEW OF SYSTEMS:   Constitutional: Denies fevers, chills or abnormal night sweats Eyes: Denies blurriness of vision, double vision or watery eyes Ears, nose, mouth, throat, and face: Denies mucositis or sore throat Respiratory: Denies cough, dyspnea or wheezes Cardiovascular: Denies palpitation, chest discomfort or lower extremity swelling Gastrointestinal:  Denies nausea, heartburn or change in bowel habits Skin: Denies abnormal skin rashes Lymphatics: Denies new lymphadenopathy or easy bruising Neurological:Denies numbness, tingling or new weaknesses Behavioral/Psych: Mood is stable, no new changes  All other systems were reviewed with the patient and are negative.  PHYSICAL EXAMINATION: ECOG PERFORMANCE STATUS: 3  Filed Vitals:   05/10/15 0932  BP: 147/64  Pulse: 98  Temp: 98.5 F (36.9 C)  Resp: 18   Filed Weights   05/10/15 0932  Weight: 115 lb 6.4 oz (52.345 kg)    GENERAL:alert, no distress and comfortable, chronically ill appearing, in wheelchair SKIN: skin color, texture, turgor are normal, no rashes or significant lesions EYES: normal, conjunctiva are pink and non-injected, sclera clear OROPHARYNX:no exudate, no erythema and lips, buccal mucosa, and tongue normal  NECK: supple, thyroid normal size, non-tender, without nodularity LYMPH:  no palpable lymphadenopathy in the cervical, axillary or inguinal LUNGS: clear to auscultation and percussion with normal breathing effort HEART: regular rate &  rhythm and no murmurs and no lower extremity edema ABDOMEN:abdomen soft, non-tender and normal bowel sounds Musculoskeletal:no cyanosis of digits and no clubbing  PSYCH: alert & oriented x 3 with fluent speech NEURO: no focal motor/sensory deficits   LABORATORY DATA:  I have reviewed the data as listed  CBC Latest Ref Rng 03/26/2015 03/17/2015 03/08/2015  WBC 3.9 - 10.3 10e3/uL 7.3 6.4 8.5  Hemoglobin 11.6 - 15.9 g/dL 11.7 12.2 12.1  Hematocrit 34.8 - 46.6 % 36.3 38.2 37.6  Platelets 145 - 400 10e3/uL 336 239 282    CMP Latest Ref Rng 03/26/2015 03/17/2015 03/08/2015  Glucose 70 - 140 mg/dl 87 86 86  BUN 7.0 - 26.0 mg/dL 20.1  21(H) 18.6  Creatinine 0.6 - 1.1 mg/dL 0.8 0.82 1.0  Sodium 136 - 145 mEq/L 141 140 141  Potassium 3.5 - 5.1 mEq/L 3.9 3.0(L) 3.6  Chloride 101 - 111 mmol/L - 104 -  CO2 22 - 29 mEq/L _0 Calcium 8.4 - 10.4 mg/dL 9.3 8.8(L) 9.4  Total Protein 6.4 - 8.3 g/dL 7.0 7.5 7.2  Total Bilirubin 0.20 - 1.20 mg/dL 0.54 0.7 0.70  Alkaline Phos 40 - 150 U/L 68 54 60  AST 5 - 34 U/L _1 ALT 0 - 55 U/L _2 RADIOGRAPHIC STUDIES: I have personally reviewed the radiological images as listed and agreed with the findings in the report.  CT chest wo contrast 03/07/2015 IMPRESSION: 1. No acute findings in the thorax. No pneumonia or pleural fluid. 2. Stable apical consolidation in the RIGHT hemi thorax. 3. Hyperinflated lungs.   ASSESSMENT & PLAN: 82 year old Caucasian female  1. Metastatic melanoma to lung, abdominal wall, subcutaneous soft tissue and lymph nodes -We discussed that her melanoma is incurable at this stage, and the goal of therapy is disease control and prolong her life  -she responded very well to immunotherapy. Her last CT scan on 03/07/2015 showed no evidence of disease -she was enrolled to hospice program in early 03/2015 due to her overall deconditioning and worsening fatigue and dementia  -we again discussed the goal of care, and  her husband and daughter clearly wants comfort care only at this point.   2.  Episodes of unconsciousness and dementia associated behavior problem  -No clinical suspicions for seizure, I think the possibility of ITA is also not high given the short duration of episodes and no residual neural deficits. I think this is probably relate to her worsening dementia, and possible related to her medications (ativan and seroquel, although it was not given right before the episodes)  -I suggested following with her neurologist, but her family member declined  -will continue monitoring  -if she has more episode of outburst of combative behavior, we can consider haloidal -we again address the goal of care, pt's husband and daughter would like to keep her at home with hospice, but they are also willing to explore the possibility of putting her in a dementia facility    3. Dementia, Hypertension, Hypothyroidism  -continue monitoring   Plan -she will continue hospice, I spoke with her hospice nurse [place] today, and possible to get hospice MD Dr. Karie Georges to see her at home  -I encourage her to talk to hospice SW to explore the possibility of nursing facility for patients with dementia. She was seen by our [facility] today -I will see her as needed in future   All questions were answered. The patient knows to call the clinic with any problems, questions or concerns.  I spent 69mnutes counseling the patient face to face. The total time spent in the appointment was 30 minutes and more than 50% was on counseling.     FTruitt Merle MD 05/10/2015

## 2015-05-11 NOTE — Telephone Encounter (Signed)
Was seen 05-10-2015 at 0930 by Dr. Burr Medico.

## 2015-05-22 ENCOUNTER — Telehealth: Payer: Self-pay | Admitting: *Deleted

## 2015-05-22 NOTE — Telephone Encounter (Signed)
TC from [place] with hospice/palliative care [place].  She wanted to clarify and get the 'ok' for pt to receive Ativan 0.5 mg 1-2 tabs every 4 hours as needed for agitation. The family has been doing this for the last couple of weeks. Ria Comment states pt is calm, not over sedated at all.  "This is the calmest I have ever seen her" per lindsay. Pt going to respite tomorrow so Ria Comment needed to clarify medications.

## 2015-05-23 NOTE — Telephone Encounter (Signed)
OK with me. No return phone number.  Could you call the [facility] program tomorrow morning, and let Mendel Ryder know that.  Thanks,  Truitt Merle  05/23/2015

## 2015-05-24 ENCOUNTER — Encounter: Payer: Self-pay | Admitting: *Deleted

## 2015-05-24 NOTE — Progress Notes (Signed)
Orders faxed back to [facility].  Then submitted to be scanned.

## 2015-05-27 ENCOUNTER — Emergency Department (HOSPITAL_COMMUNITY)

## 2015-05-27 ENCOUNTER — Encounter (HOSPITAL_COMMUNITY): Payer: Self-pay | Admitting: Vascular Surgery

## 2015-05-27 ENCOUNTER — Inpatient Hospital Stay (HOSPITAL_COMMUNITY)
Admission: EM | Admit: 2015-05-27 | Discharge: 2015-06-04 | DRG: 470 | Disposition: A | Discharge status: Hospice | Attending: Internal Medicine | Admitting: Internal Medicine

## 2015-05-27 DIAGNOSIS — E876 Hypokalemia: Secondary | ICD-10-CM | POA: Diagnosis present

## 2015-05-27 DIAGNOSIS — R339 Retention of urine, unspecified: Secondary | ICD-10-CM | POA: Diagnosis not present

## 2015-05-27 DIAGNOSIS — Z888 Allergy status to other drugs, medicaments and biological substances status: Secondary | ICD-10-CM

## 2015-05-27 DIAGNOSIS — Z801 Family history of malignant neoplasm of trachea, bronchus and lung: Secondary | ICD-10-CM

## 2015-05-27 DIAGNOSIS — M25552 Pain in left hip: Secondary | ICD-10-CM | POA: Diagnosis not present

## 2015-05-27 DIAGNOSIS — F0391 Unspecified dementia with behavioral disturbance: Secondary | ICD-10-CM | POA: Diagnosis present

## 2015-05-27 DIAGNOSIS — F03918 Unspecified dementia, unspecified severity, with other behavioral disturbance: Secondary | ICD-10-CM | POA: Diagnosis present

## 2015-05-27 DIAGNOSIS — R54 Age-related physical debility: Secondary | ICD-10-CM | POA: Diagnosis present

## 2015-05-27 DIAGNOSIS — E870 Hyperosmolality and hypernatremia: Secondary | ICD-10-CM | POA: Diagnosis present

## 2015-05-27 DIAGNOSIS — S72019A Unspecified intracapsular fracture of unspecified femur, initial encounter for closed fracture: Secondary | ICD-10-CM | POA: Diagnosis present

## 2015-05-27 DIAGNOSIS — S72012A Unspecified intracapsular fracture of left femur, initial encounter for closed fracture: Secondary | ICD-10-CM | POA: Diagnosis not present

## 2015-05-27 DIAGNOSIS — E785 Hyperlipidemia, unspecified: Secondary | ICD-10-CM | POA: Diagnosis present

## 2015-05-27 DIAGNOSIS — R030 Elevated blood-pressure reading, without diagnosis of hypertension: Secondary | ICD-10-CM

## 2015-05-27 DIAGNOSIS — Z66 Do not resuscitate: Secondary | ICD-10-CM | POA: Diagnosis present

## 2015-05-27 DIAGNOSIS — I10 Essential (primary) hypertension: Secondary | ICD-10-CM | POA: Diagnosis present

## 2015-05-27 DIAGNOSIS — IMO0001 Reserved for inherently not codable concepts without codable children: Secondary | ICD-10-CM

## 2015-05-27 DIAGNOSIS — Z96649 Presence of unspecified artificial hip joint: Secondary | ICD-10-CM

## 2015-05-27 DIAGNOSIS — E039 Hypothyroidism, unspecified: Secondary | ICD-10-CM | POA: Diagnosis present

## 2015-05-27 DIAGNOSIS — R0602 Shortness of breath: Secondary | ICD-10-CM

## 2015-05-27 DIAGNOSIS — Z7982 Long term (current) use of aspirin: Secondary | ICD-10-CM

## 2015-05-27 DIAGNOSIS — D62 Acute posthemorrhagic anemia: Secondary | ICD-10-CM | POA: Diagnosis not present

## 2015-05-27 DIAGNOSIS — W050XXA Fall from non-moving wheelchair, initial encounter: Secondary | ICD-10-CM | POA: Diagnosis present

## 2015-05-27 DIAGNOSIS — Z87891 Personal history of nicotine dependence: Secondary | ICD-10-CM

## 2015-05-27 DIAGNOSIS — R1310 Dysphagia, unspecified: Secondary | ICD-10-CM | POA: Diagnosis present

## 2015-05-27 DIAGNOSIS — C439 Malignant melanoma of skin, unspecified: Secondary | ICD-10-CM | POA: Diagnosis present

## 2015-05-27 DIAGNOSIS — T148XXA Other injury of unspecified body region, initial encounter: Secondary | ICD-10-CM

## 2015-05-27 DIAGNOSIS — Z853 Personal history of malignant neoplasm of breast: Secondary | ICD-10-CM

## 2015-05-27 DIAGNOSIS — G309 Alzheimer's disease, unspecified: Secondary | ICD-10-CM | POA: Diagnosis present

## 2015-05-27 DIAGNOSIS — Z79899 Other long term (current) drug therapy: Secondary | ICD-10-CM

## 2015-05-27 DIAGNOSIS — F0281 Dementia in other diseases classified elsewhere with behavioral disturbance: Secondary | ICD-10-CM | POA: Diagnosis present

## 2015-05-27 HISTORY — DX: Unspecified protein-calorie malnutrition: E46

## 2015-05-27 HISTORY — DX: Hyperlipidemia, unspecified: E78.5

## 2015-05-27 HISTORY — DX: Disorder of thyroid, unspecified: E07.9

## 2015-05-27 HISTORY — DX: Dementia in other diseases classified elsewhere, unspecified severity, without behavioral disturbance, psychotic disturbance, mood disturbance, and anxiety: F02.80

## 2015-05-27 HISTORY — DX: Alzheimer's disease, unspecified: G30.9

## 2015-05-27 LAB — CBC WITH DIFFERENTIAL/PLATELET
BASOS ABS: 0 10*3/uL (ref 0.0–0.1)
BASOS PCT: 0 %
EOS ABS: 0.1 10*3/uL (ref 0.0–0.7)
EOS PCT: 1 %
HCT: 36.7 % (ref 36.0–46.0)
Hemoglobin: 11.6 g/dL — ABNORMAL LOW (ref 12.0–15.0)
LYMPHS PCT: 9 %
Lymphs Abs: 0.8 10*3/uL (ref 0.7–4.0)
MCH: 25.6 pg — ABNORMAL LOW (ref 26.0–34.0)
MCHC: 31.6 g/dL (ref 30.0–36.0)
MCV: 81 fL (ref 78.0–100.0)
Monocytes Absolute: 0.5 10*3/uL (ref 0.1–1.0)
Monocytes Relative: 6 %
Neutro Abs: 7.1 10*3/uL (ref 1.7–7.7)
Neutrophils Relative %: 84 %
PLATELETS: 327 10*3/uL (ref 150–400)
RBC: 4.53 MIL/uL (ref 3.87–5.11)
RDW: 14.2 % (ref 11.5–15.5)
WBC: 8.5 10*3/uL (ref 4.0–10.5)

## 2015-05-27 LAB — BASIC METABOLIC PANEL
Anion gap: 14 (ref 5–15)
BUN: 20 mg/dL (ref 6–20)
CO2: 27 mmol/L (ref 22–32)
Calcium: 9.3 mg/dL (ref 8.9–10.3)
Chloride: 102 mmol/L (ref 101–111)
Creatinine, Ser: 0.72 mg/dL (ref 0.44–1.00)
GFR calc Af Amer: >60 mL/min (ref >60)
Glucose, Bld: 100 mg/dL — ABNORMAL HIGH (ref 65–99)
POTASSIUM: 3 mmol/L — AB (ref 3.5–5.1)
SODIUM: 143 mmol/L (ref 135–145)

## 2015-05-27 LAB — TYPE AND SCREEN
ABO/RH(D): A POS
ANTIBODY SCREEN: NEGATIVE

## 2015-05-27 LAB — PROTIME-INR
INR: 1.04 (ref 0.00–1.49)
PROTHROMBIN TIME: 13.8 s (ref 11.6–15.2)

## 2015-05-27 LAB — ABO/RH: ABO/RH(D): A POS

## 2015-05-27 MED ORDER — MORPHINE SULFATE (PF) 2 MG/ML IV SOLN: 1.0000 mg | Freq: Once | INTRAVENOUS | Status: DC

## 2015-05-27 MED ORDER — LORAZEPAM 2 MG/ML IJ SOLN
0.5000 mg | Freq: Once | INTRAMUSCULAR | Status: AC
  Administered 2015-05-28: 0.5 mg via INTRAVENOUS
  Filled 2015-05-27: qty 1

## 2015-05-27 MED ORDER — MORPHINE SULFATE (PF) 2 MG/ML IV SOLN
2.0000 mg | Freq: Once | INTRAVENOUS | Status: AC
  Administered 2015-05-27: 2 mg via INTRAVENOUS
  Filled 2015-05-27: qty 1

## 2015-05-27 NOTE — ED Notes (Signed)
This RN called [facility] and spoke with Urban Gibson and was told that the only night time meds the patient received was Atenolol and Docusate Sodium.

## 2015-05-27 NOTE — ED Notes (Signed)
Patient transported to X-ray 

## 2015-05-27 NOTE — ED Provider Notes (Signed)
CSN: [id number]     Arrival date & time 05/27/15  2154 History   First MD Initiated Contact with Patient 05/27/15 2205     Chief Complaint  Patient presents with  . Fall    (Consider location/radiation/quality/duration/timing/severity/associated sxs/prior Treatment) Patient is a 82 y.o. female presenting with fall. The history is provided by medical records, the spouse and a relative. No language interpreter was used.  Fall   Alexa Hardin is a 82 y.o. female  with a PMH of dementia, stage IV metastatic melanoma, HTN, HLD who presents to the Emergency Department with husband and daughter after a fall just prior to arrival. Per staff at facility, she tried to jump out of her wheelchair and fell onto her left hip. Unsure if patient hit head or lost consciousness. No blood thinners. Under hospice care.   Level 5 caveat applies 2/2 dementia.   Past Medical History  Diagnosis Date  . Hypertension   . Cancer ([place])     Breast/melanoma  . Dementia   . Alzheimer disease   . Unspecified protein-calorie malnutrition ([place])   . Thyroid disease   . Hyperlipemia    Past Surgical History  Procedure Laterality Date  . Lung removal, partial Left 2013  . Mastectomy Bilateral 2013  . Abdominal wall mass resection  06/15/2012   Family History  Problem Relation Age of Onset  . Cancer Father     lung cancer  . Dementia Neg Hx    Social History  Substance Use Topics  . Smoking status: Former Smoker -- 1.50 packs/day for 30 years    Quit date: 01/20/1983  . Smokeless tobacco: None  . Alcohol Use: 0.6 oz/week    1 Glasses of wine per week     Comment: every a few days    OB History    No data available     Review of Systems  Unable to perform ROS: Dementia      Allergies  Quinolones  Home Medications   Prior to Admission medications   Medication Sig Start Date End Date Taking? Authorizing Provider  aspirin 81 MG tablet Take 81 mg by mouth daily.   Yes Historical Provider, MD   atenolol (TENORMIN) 25 MG tablet Take 25 mg by mouth every evening.   Yes Historical Provider, MD  atenolol (TENORMIN) 50 MG tablet Take 50 mg by mouth daily.   Yes Historical Provider, MD  bisacodyl (DULCOLAX) 10 MG suppository Place 10 mg rectally as needed for moderate constipation.   Yes Historical Provider, MD  Cholecalciferol (VITAMIN D3) 1000 UNITS CAPS Take 1 capsule by mouth daily.   Yes Historical Provider, MD  levothyroxine (SYNTHROID, LEVOTHROID) 137 MCG tablet Take 137 mcg by mouth daily before breakfast. Reported on 05/10/2015 03/06/15  Yes Historical Provider, MD  LORazepam (ATIVAN) 0.5 MG tablet Take 0.5 mg by mouth every 4 (four) hours as needed for anxiety.   Yes Historical Provider, MD  magnesium hydroxide (MILK OF MAGNESIA) 400 MG/5ML suspension Take 30 mLs by mouth daily as needed for mild constipation.   Yes Historical Provider, MD  mirtazapine (REMERON) 15 MG tablet TAKE 1 TABLET BY MOUTH AT BEDTIME 04/26/15  Yes Truitt Merle, MD  NIFEdipine (PROCARDIA-XL/ADALAT-CC/NIFEDICAL-XL) 30 MG 24 hr tablet Take 30 mg by mouth daily.   Yes Historical Provider, MD  potassium chloride (KLOR-CON) 20 MEQ packet Take 20 mEq by mouth daily.   Yes Historical Provider, MD  Probiotic Product (PROBIOTIC PO) Take 1 capsule by mouth daily.   Yes  Historical Provider, MD  QUEtiapine (SEROQUEL) 25 MG tablet Take 25 mg by mouth 2 (two) times daily. 05/06/15  Yes Historical Provider, MD  senna-docusate (SENEXON-S) 8.6-50 MG tablet Take 2 tablets by mouth 2 (two) times daily.   Yes Historical Provider, MD  Sodium Phosphates (RA SALINE ENEMA) 19-7 GM/118ML ENEM Place 1 each rectally as needed (for constipation).   Yes Historical Provider, MD  sorbitol 70 % solution Take 30 mLs by mouth at bedtime.  04/29/15  Yes Historical Provider, MD  letrozole (FEMARA) 2.5 MG tablet Take 1 tablet (2.5 mg total) by mouth daily. Patient not taking: Reported on 05/10/2015 03/20/15   Truitt Merle, MD  megestrol (MEGACE ES) 625 MG/5ML  suspension Take 5 mLs (625 mg total) by mouth daily. Patient not taking: Reported on 03/26/2015 09/03/14   Truitt Merle, MD   BP 173/91 mmHg  Pulse 76  Temp(Src) 97.2 F (36.2 C) (Oral)  Resp 16  SpO2 96% Physical Exam  Constitutional: She appears well-developed and well-nourished.  Alert, tearful. Appears uncomfortable.   HENT:  Head: Normocephalic.  No signs of trauma.   Neck: Normal range of motion.  No TTP of midline or paraspinal musculature.   Cardiovascular: Normal rate, regular rhythm, normal heart sounds and intact distal pulses.  Exam reveals no gallop and no friction rub.   No murmur heard. Pulmonary/Chest: Effort normal and breath sounds normal. No respiratory distress. She has no wheezes. She has no rales. She exhibits no tenderness.  Abdominal: Soft. Bowel sounds are normal. She exhibits no distension and no mass. There is no tenderness. There is no rebound and no guarding.  Musculoskeletal:  Left internal rotation and shortening. Patient screamed with log roll of left lower extremity. Left hip with no bruises, gross deformity, swelling, or crepitus.   Neurological: She is alert.  Bilateral lower extremities neurovascularly intact.  Patient alert to self only.   Nursing note and vitals reviewed.   ED Course  Procedures (including critical care time) Labs Review Labs Reviewed  BASIC METABOLIC PANEL - Abnormal; Notable for the following:    Potassium 3.0 (*)    Glucose, Bld 100 (*)    All other components within normal limits  CBC WITH DIFFERENTIAL/PLATELET - Abnormal; Notable for the following:    Hemoglobin 11.6 (*)    MCH 25.6 (*)    All other components within normal limits  PROTIME-INR  URINALYSIS, ROUTINE W REFLEX MICROSCOPIC (NOT AT [facility])  TYPE AND SCREEN  ABO/RH    Imaging Review Dg Chest 1 View  05/27/2015  CLINICAL DATA:  Left hip fracture. Malignant melanoma in bilateral breast carcinoma. Pre-op respiratory exam EXAM: CHEST 1 VIEW COMPARISON:   03/17/2015 FINDINGS: Right-sided Port-A-Cath remains in appropriate position. Mild cardiomegaly is stable. Pulmonary hyperinflation is seen, consistent with COPD. No evidence of acute infiltrate or pleural effusion. IMPRESSION: Stable COPD and mild cardiomegaly.  No acute findings. Electronically Signed   By: Earle Gell M.D.   On: 05/27/2015 22:48   Ct Head Wo Contrast  05/28/2015  CLINICAL DATA:  Initial valuation for acute trauma, fall. EXAM: CT HEAD WITHOUT CONTRAST TECHNIQUE: Contiguous axial images were obtained from the base of the skull through the vertex without intravenous contrast. COMPARISON:  Prior study from 03/17/2015. FINDINGS: Diffuse prominence of the CSF containing spaces is compatible with generalized age-related cerebral atrophy. Patchy hypodensity within the periventricular and deep white matter most compatible with chronic small vessel ischemic disease. Vascular calcifications within the carotid siphons. Overall, these changes are stable.  No acute intracranial hemorrhage. No acute large vessel territory infarct. No mass lesion, midline shift, or mass effect. Ventricular prominence related to global parenchymal volume loss present, stable. No hydrocephalus. No extra-axial fluid collection. Scalp soft tissues demonstrate no acute abnormality. No acute abnormality about the orbits. Paranasal sinuses are clear.  No mastoid effusion. Calvarium intact. IMPRESSION: 1. No acute intracranial process. 2. Moderate diffuse atrophy with chronic small vessel ischemic disease, stable. Electronically Signed   By: Jeannine Boga M.D.   On: 05/28/2015 01:25   Dg Hip Unilat With Pelvis 2-3 Views Left  05/27/2015  CLINICAL DATA:  Left hip injury and pain after jumping out of wheelchair. Initial encounter. EXAM: DG HIP (WITH OR WITHOUT PELVIS) 2-3V LEFT COMPARISON:  None. FINDINGS: A subcapital left femoral neck fracture is seen. No evidence of dislocation. Generalized osteopenia noted. No pelvic  fracture identified. Bipolar right hip prosthesis noted as well as generalized osteopenia. IMPRESSION: Subcapital left femoral neck fracture. Diffuse osteopenia. Electronically Signed   By: Earle Gell M.D.   On: 05/27/2015 22:46   I have personally reviewed and evaluated these images and lab results as part of my medical decision-making.   EKG Interpretation None      MDM   Final diagnoses:  Closed subcapital fracture of neck of left femur, initial encounter (HCC)  Hypokalemia  Elevated blood pressure   Alexa Hardin presents to ED with daughter and husband after a fall from wheelchair just prior to arrival. Patient lives at [place] facility. Hx of dementia and stage IV metastatic melanoma on hospice care. On exam, bilateral LE nvi. Internal rotation on left, pain with left log roll - concern for hip fracture.   Labs: CBC reassuring, CMP with K+ of 3, PT/INR, Type and screen, ABO/Rh obtained.  Imaging: Hip/pelvis x-rays show subcapital left femoral neck fracture, CXR and CT head reassuring.   Consults: Ortho, Dr. Erlinda Hong, was consulted who recommends medical admission and he will see patient in the morning. Hospitalist, Dr. Loleta Books, will admit patient.   Therapeutics: Pain control, home ativan 0.5 given for agitation  Patient discussed with Dr. Kathrynn Humble who agrees with treatment plan.     [facility] Krishawn Vanderweele, PA-C 05/28/15 2902  Varney Biles, MD 05/28/15 [phone]

## 2015-05-27 NOTE — ED Notes (Signed)
Pt reports to the ED for eval of possible injury after she "jumped" out of her wheelchair and landed on her left sided. Left internal rotation and shortening noted. CMS intact. Unknown head injury or LOC. Pt gripping her left hip and crying out. Some ecchymosis noted to the left lower leg. No obvious deformity to hip noted or crepitus. Pt alert, oriented at baseline, and skin cool and dry. Pt is a hospice patient.

## 2015-05-28 ENCOUNTER — Emergency Department (HOSPITAL_COMMUNITY)

## 2015-05-28 ENCOUNTER — Encounter (HOSPITAL_COMMUNITY): Payer: Self-pay | Admitting: Radiology

## 2015-05-28 DIAGNOSIS — E038 Other specified hypothyroidism: Secondary | ICD-10-CM | POA: Diagnosis not present

## 2015-05-28 DIAGNOSIS — Z853 Personal history of malignant neoplasm of breast: Secondary | ICD-10-CM | POA: Diagnosis not present

## 2015-05-28 DIAGNOSIS — Z7982 Long term (current) use of aspirin: Secondary | ICD-10-CM | POA: Diagnosis not present

## 2015-05-28 DIAGNOSIS — S72011A Unspecified intracapsular fracture of right femur, initial encounter for closed fracture: Secondary | ICD-10-CM | POA: Diagnosis not present

## 2015-05-28 DIAGNOSIS — E039 Hypothyroidism, unspecified: Secondary | ICD-10-CM

## 2015-05-28 DIAGNOSIS — F0391 Unspecified dementia with behavioral disturbance: Secondary | ICD-10-CM | POA: Diagnosis not present

## 2015-05-28 DIAGNOSIS — F0281 Dementia in other diseases classified elsewhere with behavioral disturbance: Secondary | ICD-10-CM | POA: Diagnosis present

## 2015-05-28 DIAGNOSIS — S72012A Unspecified intracapsular fracture of left femur, initial encounter for closed fracture: Secondary | ICD-10-CM | POA: Diagnosis present

## 2015-05-28 DIAGNOSIS — I10 Essential (primary) hypertension: Secondary | ICD-10-CM | POA: Diagnosis present

## 2015-05-28 DIAGNOSIS — R339 Retention of urine, unspecified: Secondary | ICD-10-CM | POA: Diagnosis not present

## 2015-05-28 DIAGNOSIS — E876 Hypokalemia: Secondary | ICD-10-CM | POA: Diagnosis not present

## 2015-05-28 DIAGNOSIS — G309 Alzheimer's disease, unspecified: Secondary | ICD-10-CM | POA: Diagnosis present

## 2015-05-28 DIAGNOSIS — E870 Hyperosmolality and hypernatremia: Secondary | ICD-10-CM | POA: Diagnosis present

## 2015-05-28 DIAGNOSIS — C439 Malignant melanoma of skin, unspecified: Secondary | ICD-10-CM | POA: Diagnosis present

## 2015-05-28 DIAGNOSIS — M25552 Pain in left hip: Secondary | ICD-10-CM | POA: Diagnosis present

## 2015-05-28 DIAGNOSIS — Z888 Allergy status to other drugs, medicaments and biological substances status: Secondary | ICD-10-CM | POA: Diagnosis not present

## 2015-05-28 DIAGNOSIS — Z79899 Other long term (current) drug therapy: Secondary | ICD-10-CM | POA: Diagnosis not present

## 2015-05-28 DIAGNOSIS — R1310 Dysphagia, unspecified: Secondary | ICD-10-CM | POA: Diagnosis present

## 2015-05-28 DIAGNOSIS — Z87891 Personal history of nicotine dependence: Secondary | ICD-10-CM | POA: Diagnosis not present

## 2015-05-28 DIAGNOSIS — E785 Hyperlipidemia, unspecified: Secondary | ICD-10-CM | POA: Diagnosis present

## 2015-05-28 DIAGNOSIS — Z801 Family history of malignant neoplasm of trachea, bronchus and lung: Secondary | ICD-10-CM | POA: Diagnosis not present

## 2015-05-28 DIAGNOSIS — Z66 Do not resuscitate: Secondary | ICD-10-CM | POA: Diagnosis present

## 2015-05-28 DIAGNOSIS — R54 Age-related physical debility: Secondary | ICD-10-CM | POA: Diagnosis present

## 2015-05-28 DIAGNOSIS — S72019A Unspecified intracapsular fracture of unspecified femur, initial encounter for closed fracture: Secondary | ICD-10-CM | POA: Diagnosis present

## 2015-05-28 DIAGNOSIS — D62 Acute posthemorrhagic anemia: Secondary | ICD-10-CM | POA: Diagnosis not present

## 2015-05-28 DIAGNOSIS — W050XXA Fall from non-moving wheelchair, initial encounter: Secondary | ICD-10-CM | POA: Diagnosis present

## 2015-05-28 LAB — BASIC METABOLIC PANEL
ANION GAP: 11 (ref 5–15)
BUN: 16 mg/dL (ref 6–20)
CO2: 27 mmol/L (ref 22–32)
Calcium: 8.9 mg/dL (ref 8.9–10.3)
Chloride: 107 mmol/L (ref 101–111)
Creatinine, Ser: 0.72 mg/dL (ref 0.44–1.00)
GFR calc Af Amer: >60 mL/min (ref >60)
GFR calc non Af Amer: >60 mL/min (ref >60)
GLUCOSE: 97 mg/dL (ref 65–99)
POTASSIUM: 3.7 mmol/L (ref 3.5–5.1)
Sodium: 145 mmol/L (ref 135–145)

## 2015-05-28 LAB — FERRITIN: Ferritin: 52 ng/mL (ref 11–307)

## 2015-05-28 LAB — CBC
HCT: 36.7 % (ref 36.0–46.0)
HEMOGLOBIN: 11.2 g/dL — AB (ref 12.0–15.0)
MCH: 24.7 pg — AB (ref 26.0–34.0)
MCHC: 30.5 g/dL (ref 30.0–36.0)
MCV: 81 fL (ref 78.0–100.0)
Platelets: 293 10*3/uL (ref 150–400)
RBC: 4.53 MIL/uL (ref 3.87–5.11)
RDW: 14.3 % (ref 11.5–15.5)
WBC: 9.6 10*3/uL (ref 4.0–10.5)

## 2015-05-28 LAB — MAGNESIUM: Magnesium: 1.5 mg/dL — ABNORMAL LOW (ref 1.7–2.4)

## 2015-05-28 MED ORDER — MORPHINE SULFATE (PF) 2 MG/ML IV SOLN
0.5000 mg | INTRAVENOUS | Status: DC | PRN
  Administered 2015-05-28: 0.5 mg via INTRAVENOUS
  Filled 2015-05-28: qty 1

## 2015-05-28 MED ORDER — NIFEDIPINE ER OSMOTIC RELEASE 30 MG PO TB24
30.0000 mg | ORAL_TABLET | Freq: Every day | ORAL | Status: DC
  Administered 2015-05-28 – 2015-06-01 (×3): 30 mg via ORAL
  Filled 2015-05-28 (×6): qty 1

## 2015-05-28 MED ORDER — POTASSIUM CHLORIDE 20 MEQ PO PACK: 20.0000 meq | PACK | Freq: Every day | ORAL | Status: DC

## 2015-05-28 MED ORDER — POTASSIUM CHLORIDE IN NACL 40-0.9 MEQ/L-% IV SOLN
INTRAVENOUS | Status: DC
  Administered 2015-05-28: 75 mL/h via INTRAVENOUS
  Administered 2015-05-29 – 2015-06-01 (×2): 40 mL/h via INTRAVENOUS
  Filled 2015-05-28 (×8): qty 1000

## 2015-05-28 MED ORDER — HYDROCODONE-ACETAMINOPHEN 5-325 MG PO TABS
1.0000 | ORAL_TABLET | Freq: Four times a day (QID) | ORAL | Status: DC | PRN
  Administered 2015-05-28: 2 via ORAL
  Administered 2015-05-28: 1 via ORAL
  Administered 2015-05-28: 2 via ORAL
  Administered 2015-05-29 (×3): 1 via ORAL
  Filled 2015-05-28 (×3): qty 1
  Filled 2015-05-28 (×2): qty 2
  Filled 2015-05-28: qty 1

## 2015-05-28 MED ORDER — POLYETHYLENE GLYCOL 3350 17 G PO PACK: 17.0000 g | PACK | Freq: Every day | ORAL | Status: DC | PRN

## 2015-05-28 MED ORDER — ATENOLOL 25 MG PO TABS
50.0000 mg | ORAL_TABLET | Freq: Every day | ORAL | Status: DC
  Administered 2015-05-28 – 2015-05-31 (×3): 50 mg via ORAL
  Filled 2015-05-28 (×4): qty 2

## 2015-05-28 MED ORDER — POTASSIUM CHLORIDE 10 MEQ/100ML IV SOLN
10.0000 meq | INTRAVENOUS | Status: AC
  Administered 2015-05-28 (×2): 10 meq via INTRAVENOUS
  Filled 2015-05-28 (×2): qty 100

## 2015-05-28 MED ORDER — QUETIAPINE FUMARATE 25 MG PO TABS
25.0000 mg | ORAL_TABLET | Freq: Two times a day (BID) | ORAL | Status: DC
  Administered 2015-05-28 – 2015-06-04 (×13): 25 mg via ORAL
  Filled 2015-05-28 (×13): qty 1

## 2015-05-28 MED ORDER — MIRTAZAPINE 15 MG PO TABS
15.0000 mg | ORAL_TABLET | Freq: Every day | ORAL | Status: DC
  Administered 2015-05-28 – 2015-06-03 (×7): 15 mg via ORAL
  Filled 2015-05-28 (×7): qty 1

## 2015-05-28 MED ORDER — POTASSIUM CHLORIDE CRYS ER 20 MEQ PO TBCR
20.0000 meq | EXTENDED_RELEASE_TABLET | Freq: Every day | ORAL | Status: DC
  Administered 2015-05-28 – 2015-06-04 (×6): 20 meq via ORAL
  Filled 2015-05-28 (×6): qty 1

## 2015-05-28 MED ORDER — DOCUSATE SODIUM 100 MG PO CAPS
100.0000 mg | ORAL_CAPSULE | Freq: Two times a day (BID) | ORAL | Status: DC
  Administered 2015-05-28 – 2015-06-03 (×10): 100 mg via ORAL
  Filled 2015-05-28 (×11): qty 1

## 2015-05-28 MED ORDER — BISACODYL 10 MG RE SUPP: 10.0000 mg | Freq: Every day | RECTAL | Status: DC | PRN

## 2015-05-28 MED ORDER — ATENOLOL 25 MG PO TABS
25.0000 mg | ORAL_TABLET | Freq: Every evening | ORAL | Status: DC
  Administered 2015-05-28 – 2015-05-31 (×2): 25 mg via ORAL
  Filled 2015-05-28 (×2): qty 1

## 2015-05-28 MED ORDER — SENNOSIDES-DOCUSATE SODIUM 8.6-50 MG PO TABS
2.0000 | ORAL_TABLET | Freq: Two times a day (BID) | ORAL | Status: DC
  Administered 2015-05-28 – 2015-06-04 (×12): 2 via ORAL
  Filled 2015-05-28 (×13): qty 2

## 2015-05-28 MED ORDER — ASPIRIN 81 MG PO CHEW
81.0000 mg | CHEWABLE_TABLET | Freq: Every day | ORAL | Status: DC
  Administered 2015-05-28: 81 mg via ORAL
  Filled 2015-05-28: qty 1

## 2015-05-28 MED ORDER — MORPHINE SULFATE (PF) 2 MG/ML IV SOLN
2.0000 mg | Freq: Once | INTRAVENOUS | Status: AC
  Administered 2015-05-28: 2 mg via INTRAVENOUS
  Filled 2015-05-28: qty 1

## 2015-05-28 MED ORDER — LORAZEPAM 0.5 MG PO TABS
0.5000 mg | ORAL_TABLET | ORAL | Status: DC | PRN
  Administered 2015-05-28 – 2015-06-01 (×6): 0.5 mg via ORAL
  Filled 2015-05-28 (×7): qty 1

## 2015-05-28 MED ORDER — LEVOTHYROXINE SODIUM 25 MCG PO TABS
137.0000 ug | ORAL_TABLET | Freq: Every day | ORAL | Status: DC
  Administered 2015-05-28 – 2015-06-04 (×7): 137 ug via ORAL
  Filled 2015-05-28 (×7): qty 1

## 2015-05-28 NOTE — Progress Notes (Signed)
PROGRESS NOTE        PATIENT DETAILS Name: Alexa Hardin Age: 82 y.o. Sex: female Date of Birth: 27-Apr-1932 Admit Date: 05/27/2015 Admitting Physician Edwin Dada, MD YKZ:[id number],BLTJQ F, MD Outpatient Specialists:Dr Burr Medico  Brief Narrative: Patient is a 82 y.o. female with hx of breast cancer, stage 4 Melanoma, advanced dementia being followed by hospice at home, presented to the ED after a mechanical fall at [facility] she was for respite care. Further evaluation revealed left hip fracture.   Subjective: Sleeping comfortably.  Assessment/Plan: Principal Problem: Closed subcapital fracture of neck of left femur:Secondary to a mechanical fall-patient very minimally ambulatory with very advanced dementia-family contemplating non operative vs operative treatment. Ortho- Dr Erlinda Hong to discuss with family. Continue supportive care.  Active Problems: Dementia with behavioral disturbance:currently-pleasantly confused-occasionally follows commands. Continue supportive care with Seroquel,prn Lorazepam  Hypothyroidism:continue synthroid  Hypertension:controlled-cotninue Atenolol and Nifedipine  Hypokalemia:replete and recheck  Hx of B/L Breast cancer/Stage 4 Melanoma:followed by Dr Ina Homes outpatient Oncology notes-ast CT scan on 03/07/2015 showed no evidence of disease.She is enrolled in hospice program in early 03/2015 due to her overall deconditioning and worsening fatigue and dementia   DVT Prophylaxis: SCD's  Code Status: DNR  Family Communication: Daughter/Spouse at bedside  Disposition Plan: Remain inpatient-suspect will require SNF with palliative/hospice follow up on discharge  Antimicrobial agents: None  Procedures: None  CONSULTS:  orthopedic surgery  Time spent: 25- minutes-Greater than 50% of this time was spent in counseling, explanation of diagnosis, planning of further management, and coordination of  care.  MEDICATIONS: Anti-infectives    None      Scheduled Meds: . aspirin  81 mg Oral Daily  . atenolol  25 mg Oral QPM  . atenolol  50 mg Oral Daily  . docusate sodium  100 mg Oral BID  . levothyroxine  137 mcg Oral QAC breakfast  . mirtazapine  15 mg Oral QHS  . NIFEdipine  30 mg Oral Daily  . potassium chloride  20 mEq Oral Daily  . QUEtiapine  25 mg Oral BID  . senna-docusate  2 tablet Oral BID   Continuous Infusions: . 0.9 % NaCl with KCl 40 mEq / L 75 mL/hr (05/28/15 0509)   PRN Meds:.bisacodyl, HYDROcodone-acetaminophen, LORazepam, morphine injection, polyethylene glycol   PHYSICAL EXAM: Vital signs: Filed Vitals:   05/28/15 0330 05/28/15 0400 05/28/15 0433 05/28/15 0934  BP: 159/70 149/67 178/70 136/66  Pulse: 87 89 92 90  Temp:   99.4 F (37.4 C) 98.8 F (37.1 C)  TempSrc:   Axillary Axillary  Resp:   20 18  Weight:   51.211 kg (112 lb 14.4 oz)   SpO2: 100% 100% 98% 98%   Filed Weights   05/28/15 0433  Weight: 51.211 kg (112 lb 14.4 oz)   Body mass index is 16.66 kg/(m^2).   Gen Exam: Awake, pleasantly confused. Appears in mild distress from pain Neck: Supple, No JVD.   Chest: B/L Clear.   CVS: S1 S2 Regular Abdomen: soft, BS +, non tender, non distended.  Extremities: no edema, lower extremities warm to touch. Neurologic: Moves all 4 ext Skin: No Rash or lesions   Wounds: N/A.    LABORATORY DATA: CBC:  Recent Labs Lab 05/27/15 2249 05/28/15 0835  WBC 8.5 9.6  NEUTROABS 7.1  --   HGB 11.6* 11.2*  HCT 36.7 36.7  MCV 81.0  81.0  PLT 327 284    Basic Metabolic Panel:  Recent Labs Lab 05/27/15 2249 05/28/15 0835  NA 143 145  K 3.0* 3.7  CL 102 107  CO2 27 27  GLUCOSE 100* 97  BUN 20 16  CREATININE 0.72 0.72  CALCIUM 9.3 8.9  MG  --  1.5*    GFR: Estimated Creatinine Clearance: 45.3 mL/min (by C-G formula based on Cr of 0.72).  Liver Function Tests: No results for input(s): AST, ALT, ALKPHOS, BILITOT, PROT, ALBUMIN in the  last 168 hours. No results for input(s): LIPASE, AMYLASE in the last 168 hours. No results for input(s): AMMONIA in the last 168 hours.  Coagulation Profile:  Recent Labs Lab 05/27/15 2249  INR 1.04    Cardiac Enzymes: No results for input(s): CKTOTAL, CKMB, CKMBINDEX, TROPONINI in the last 168 hours.  BNP (last 3 results) No results for input(s): PROBNP in the last 8760 hours.  HbA1C: No results for input(s): HGBA1C in the last 72 hours.  CBG: No results for input(s): GLUCAP in the last 168 hours.  Lipid Profile: No results for input(s): CHOL, HDL, LDLCALC, TRIG, CHOLHDL, LDLDIRECT in the last 72 hours.  Thyroid Function Tests: No results for input(s): TSH, T4TOTAL, FREET4, T3FREE, THYROIDAB in the last 72 hours.  Anemia Panel:  Recent Labs  05/28/15 0835  FERRITIN 52    Urine analysis:    Component Value Date/Time   COLORURINE YELLOW 03/17/2015 [place] 03/17/2015 1644   LABSPEC 1.010 03/17/2015 1644   PHURINE 7.0 03/17/2015 1644   GLUCOSEU NEGATIVE 03/17/2015 1644   HGBUR NEGATIVE 03/17/2015 1644   BILIRUBINUR NEGATIVE 03/17/2015 [place] 03/17/2015 1644   PROTEINUR NEGATIVE 03/17/2015 1644   NITRITE NEGATIVE 03/17/2015 1644   LEUKOCYTESUR NEGATIVE 03/17/2015 1644    Sepsis Labs: Lactic Acid, Venous No results found for: LATICACIDVEN  MICROBIOLOGY: No results found for this or any previous visit (from the past 240 hour(s)).  RADIOLOGY STUDIES/RESULTS: Dg Chest 1 View  05/27/2015  CLINICAL DATA:  Left hip fracture. Malignant melanoma in bilateral breast carcinoma. Pre-op respiratory exam EXAM: CHEST 1 VIEW COMPARISON:  03/17/2015 FINDINGS: Right-sided Port-A-Cath remains in appropriate position. Mild cardiomegaly is stable. Pulmonary hyperinflation is seen, consistent with COPD. No evidence of acute infiltrate or pleural effusion. IMPRESSION: Stable COPD and mild cardiomegaly.  No acute findings. Electronically Signed    By: Earle Gell M.D.   On: 05/27/2015 22:48   Ct Head Wo Contrast  05/28/2015  CLINICAL DATA:  Initial valuation for acute trauma, fall. EXAM: CT HEAD WITHOUT CONTRAST TECHNIQUE: Contiguous axial images were obtained from the base of the skull through the vertex without intravenous contrast. COMPARISON:  Prior study from 03/17/2015. FINDINGS: Diffuse prominence of the CSF containing spaces is compatible with generalized age-related cerebral atrophy. Patchy hypodensity within the periventricular and deep white matter most compatible with chronic small vessel ischemic disease. Vascular calcifications within the carotid siphons. Overall, these changes are stable. No acute intracranial hemorrhage. No acute large vessel territory infarct. No mass lesion, midline shift, or mass effect. Ventricular prominence related to global parenchymal volume loss present, stable. No hydrocephalus. No extra-axial fluid collection. Scalp soft tissues demonstrate no acute abnormality. No acute abnormality about the orbits. Paranasal sinuses are clear.  No mastoid effusion. Calvarium intact. IMPRESSION: 1. No acute intracranial process. 2. Moderate diffuse atrophy with chronic small vessel ischemic disease, stable. Electronically Signed   By: Jeannine Boga M.D.   On: 05/28/2015 01:25  Dg Hip Unilat With Pelvis 2-3 Views Left  05/27/2015  CLINICAL DATA:  Left hip injury and pain after jumping out of wheelchair. Initial encounter. EXAM: DG HIP (WITH OR WITHOUT PELVIS) 2-3V LEFT COMPARISON:  None. FINDINGS: A subcapital left femoral neck fracture is seen. No evidence of dislocation. Generalized osteopenia noted. No pelvic fracture identified. Bipolar right hip prosthesis noted as well as generalized osteopenia. IMPRESSION: Subcapital left femoral neck fracture. Diffuse osteopenia. Electronically Signed   By: Earle Gell M.D.   On: 05/27/2015 22:46     LOS: 0 days   Oren Binet, MD  [facility] Pager:[phone]  [phone]  If 7PM-7AM, please contact night-coverage [email or web address] Password [email or web address] 05/28/2015, 1:12 PM

## 2015-05-28 NOTE — H&P (Signed)
History and Physical  Patient Name: Alexa Hardin     KWI:[id number]    DOB: 1932-09-08    DOA: 05/27/2015 Referring provider: Pixie Casino, PA-C PCP: Marylene Land, MD  Outpatient specialists:  Truitt Merle, Oncology     Sarina Ill, Neurology Patient coming from: Home/Nursing home  Chief Complaint: Hip pain  HPI: [name] is a 82 y.o. female with a past medical history significant for severe dementia, HTN, hx of CVA who presents with fall and LEFT hip fracture.  All history is collected from daughter and husband, as patient is unable to provide meaningful history because of dementia.  The patient had been in respite care at a nursing home for the last few days and today was reported to "lunge" out of her wheelchair landing on the left side.  She was crying out in pain afterwards, clutching the left hip, and so was transported to the ER.  In the ED, she was afebrile, hemodynamically stable.  Na 143, K 3.0, Cr 0.7, WBC 8.5K, Hgb 11.6 (at baseline).  Radiograph of the left hip showed a subcapital femoral neck fracture and TRH were asked to evaluate for admission.  The case was discussed with Dr. Erlinda Hong from Orthopedics who agreed to see the patient.    Patient currently enrolled in Hospice.  Has history of metastatic melanoma to lung s/p resection and last PET scan without recurrence.  However, has had progression of dementia, and in January was admitted for pneumonia, but since then has had a decline in functional status, is now wheelchair bound, profoundly demented, and now enrolled in Hospice.     Review of Systems:  Unable to obtain due to patient mentation.    Past Medical History  Diagnosis Date  . Hypertension   . Cancer ([place])     Breast/melanoma  . Dementia   . Alzheimer disease   . Unspecified protein-calorie malnutrition ([place])   . Thyroid disease   . Hyperlipemia     Past Surgical History  Procedure Laterality Date  . Lung removal, partial Left 2013  .  Mastectomy Bilateral 2013  . Abdominal wall mass resection  06/15/2012    Social History: Patient lives with her husband.  She lived most of her life in [place], recently moved here to be near family.  Former smoker, remote.  Currently non ambulatory, dependent for all ADLs, "like a 27 year old" per daughter.    Allergies  Allergen Reactions  . Quinolones Rash    Ciprofloxacin.    Family history: family history includes Cancer in her father. There is no history of Dementia.  Prior to Admission medications   Medication Sig Start Date End Date Taking? Authorizing Provider  aspirin 81 MG tablet Take 81 mg by mouth daily.   Yes Historical Provider, MD  atenolol (TENORMIN) 25 MG tablet Take 25 mg by mouth every evening.   Yes Historical Provider, MD  atenolol (TENORMIN) 50 MG tablet Take 50 mg by mouth daily.   Yes Historical Provider, MD  bisacodyl (DULCOLAX) 10 MG suppository Place 10 mg rectally as needed for moderate constipation.   Yes Historical Provider, MD  Cholecalciferol (VITAMIN D3) 1000 UNITS CAPS Take 1 capsule by mouth daily.   Yes Historical Provider, MD  levothyroxine (SYNTHROID, LEVOTHROID) 137 MCG tablet Take 137 mcg by mouth daily before breakfast. Reported on 05/10/2015 03/06/15  Yes Historical Provider, MD  LORazepam (ATIVAN) 0.5 MG tablet Take 0.5 mg by mouth every 4 (four) hours as needed for anxiety.  Yes Historical Provider, MD  magnesium hydroxide (MILK OF MAGNESIA) 400 MG/5ML suspension Take 30 mLs by mouth daily as needed for mild constipation.   Yes Historical Provider, MD  mirtazapine (REMERON) 15 MG tablet TAKE 1 TABLET BY MOUTH AT BEDTIME 04/26/15  Yes Truitt Merle, MD  NIFEdipine (PROCARDIA-XL/ADALAT-CC/NIFEDICAL-XL) 30 MG 24 hr tablet Take 30 mg by mouth daily.   Yes Historical Provider, MD  potassium chloride (KLOR-CON) 20 MEQ packet Take 20 mEq by mouth daily.   Yes Historical Provider, MD  Probiotic Product (PROBIOTIC PO) Take 1 capsule by mouth daily.   Yes Historical  Provider, MD  QUEtiapine (SEROQUEL) 25 MG tablet Take 25 mg by mouth 2 (two) times daily. 05/06/15  Yes Historical Provider, MD  senna-docusate (SENEXON-S) 8.6-50 MG tablet Take 2 tablets by mouth 2 (two) times daily.   Yes Historical Provider, MD  Sodium Phosphates (RA SALINE ENEMA) 19-7 GM/118ML ENEM Place 1 each rectally as needed (for constipation).   Yes Historical Provider, MD  sorbitol 70 % solution Take 30 mLs by mouth at bedtime.  04/29/15  Yes Historical Provider, MD  letrozole (FEMARA) 2.5 MG tablet Take 1 tablet (2.5 mg total) by mouth daily. Patient not taking: Reported on 05/10/2015 03/20/15   Truitt Merle, MD  megestrol (MEGACE ES) 625 MG/5ML suspension Take 5 mLs (625 mg total) by mouth daily. Patient not taking: Reported on 03/26/2015 09/03/14   Truitt Merle, MD       Physical Exam: BP 173/91 mmHg  Pulse 76  Temp(Src) 97.2 F (36.2 C) (Oral)  Resp 16  SpO2 96% General appearance: Frail elderly  female, awake but not oriented and babbling nonsense.  Appears somewhat agitated.   Eyes: Anicteric, conjunctiva pink, lids and lashes normal.     ENT: No nasal deformity, discharge, or epistaxis.  Lips dry.  Poor dentition. Cannot examine MM because patient does not follow commands. Lymph: No cervical or supraclavicular lymphadenopathy. Skin: Warm and dry.   Cardiac: RRR, nl S1-S2, no murmurs appreciated.  Capillary refill is brisk.  JVP normal.  1+ b ilateral pitting LE edema at ankles only.  Radial and DP pulses 2+ and symmetric. Respiratory: Normal respiratory rate and rhythm.  No rales or wheezes appreciated. Abdomen: Abdomen soft without rigidity.  No TTP. No ascites, distension.   MSK: Left leg foreshortening.  No pain to palpation bilaterally from knees down. Neuro: Disoriented, babbling.  Does not follow commands.  Upper extremity strength appears symmetric and 5/5.  Lower extremities not tested.   Psych: Agitated, speaking non-sense and not oriented to person, place or situation.      Labs on Admission:  I have personally reviewed following labs and imaging studies: CBC:  Recent Labs Lab 05/27/15 2249  WBC 8.5  NEUTROABS 7.1  HGB 11.6*  HCT 36.7  MCV 81.0  PLT 542   Basic Metabolic Panel:  Recent Labs Lab 05/27/15 2249  NA 143  K 3.0*  CL 102  CO2 27  GLUCOSE 100*  BUN 20  CREATININE 0.72  CALCIUM 9.3   GFR: CrCl cannot be calculated (Unknown ideal weight.). Liver Function Tests: No results for input(s): AST, ALT, ALKPHOS, BILITOT, PROT, ALBUMIN in the last 168 hours. No results for input(s): LIPASE, AMYLASE in the last 168 hours. No results for input(s): AMMONIA in the last 168 hours. Coagulation Profile:  Recent Labs Lab 05/27/15 2249  INR 1.04   Cardiac Enzymes: No results for input(s): CKTOTAL, CKMB, CKMBINDEX, TROPONINI in the last 168 hours. BNP (last  3 results) No results for input(s): PROBNP in the last 8760 hours. HbA1C: No results for input(s): HGBA1C in the last 72 hours. CBG: No results for input(s): GLUCAP in the last 168 hours. Lipid Profile: No results for input(s): CHOL, HDL, LDLCALC, TRIG, CHOLHDL, LDLDIRECT in the last 72 hours. Thyroid Function Tests: No results for input(s): TSH, T4TOTAL, FREET4, T3FREE, THYROIDAB in the last 72 hours. Anemia Panel: No results for input(s): VITAMINB12, FOLATE, FERRITIN, TIBC, IRON, RETICCTPCT in the last 72 hours. Urine analysis:    Component Value Date/Time   COLORURINE YELLOW 03/17/2015 [place] 03/17/2015 1644   LABSPEC 1.010 03/17/2015 1644   PHURINE 7.0 03/17/2015 1644   GLUCOSEU NEGATIVE 03/17/2015 1644   HGBUR NEGATIVE 03/17/2015 [place] 03/17/2015 [place] 03/17/2015 1644   PROTEINUR NEGATIVE 03/17/2015 1644   NITRITE NEGATIVE 03/17/2015 1644   LEUKOCYTESUR NEGATIVE 03/17/2015 1644   Sepsis Labs: '@LABRCNTIP'$ (procalcitonin:4,lacticidven:4) )No results found for this or any previous visit (from the past 240  hour(s)).       Radiological Exams on Admission: Personally reviewed: Dg Chest 1 View  05/27/2015  CLINICAL DATA:  Left hip fracture. Malignant melanoma in bilateral breast carcinoma. Pre-op respiratory exam EXAM: CHEST 1 VIEW COMPARISON:  03/17/2015 FINDINGS: Right-sided Port-A-Cath remains in appropriate position. Mild cardiomegaly is stable. Pulmonary hyperinflation is seen, consistent with COPD. No evidence of acute infiltrate or pleural effusion. IMPRESSION: Stable COPD and mild cardiomegaly.  No acute findings. Electronically Signed   By: Earle Gell M.D.   On: 05/27/2015 22:48   Ct Head Wo Contrast  05/28/2015  CLINICAL DATA:  Initial valuation for acute trauma, fall. EXAM: CT HEAD WITHOUT CONTRAST TECHNIQUE: Contiguous axial images were obtained from the base of the skull through the vertex without intravenous contrast. COMPARISON:  Prior study from 03/17/2015. FINDINGS: Diffuse prominence of the CSF containing spaces is compatible with generalized age-related cerebral atrophy. Patchy hypodensity within the periventricular and deep white matter most compatible with chronic small vessel ischemic disease. Vascular calcifications within the carotid siphons. Overall, these changes are stable. No acute intracranial hemorrhage. No acute large vessel territory infarct. No mass lesion, midline shift, or mass effect. Ventricular prominence related to global parenchymal volume loss present, stable. No hydrocephalus. No extra-axial fluid collection. Scalp soft tissues demonstrate no acute abnormality. No acute abnormality about the orbits. Paranasal sinuses are clear.  No mastoid effusion. Calvarium intact. IMPRESSION: 1. No acute intracranial process. 2. Moderate diffuse atrophy with chronic small vessel ischemic disease, stable. Electronically Signed   By: Jeannine Boga M.D.   On: 05/28/2015 01:25   Dg Hip Unilat With Pelvis 2-3 Views Left  05/27/2015  CLINICAL DATA:  Left hip injury and pain after  jumping out of wheelchair. Initial encounter. EXAM: DG HIP (WITH OR WITHOUT PELVIS) 2-3V LEFT COMPARISON:  None. FINDINGS: A subcapital left femoral neck fracture is seen. No evidence of dislocation. Generalized osteopenia noted. No pelvic fracture identified. Bipolar right hip prosthesis noted as well as generalized osteopenia. IMPRESSION: Subcapital left femoral neck fracture. Diffuse osteopenia. Electronically Signed   By: Earle Gell M.D.   On: 05/27/2015 22:46        Assessment/Plan Assessment and Plan: 1. Hip fracture: The patient will be seen by Dr. Erlinda Hong in the morning at [facility], to evaluate for operative fixation of the LEFT hip.   -Admit to med-surg bed -Hydrocodone-acetaminophen or morphine as tolerated for pain -Bed rest, apply ice, document sedation and vitals per Hip  fracture protocol -NPO at midnight -MIVF with K -Nutrition consulted   Overall, the patient is at low to intermediate CV risk for the planned surgery.  Patient has a RCRI score of 1 (hx of TIA/CVA), no active cardiac symptoms, but is sedentary with a functional capacity <4 METs. Overall, her sedentary functional status complicates cardiac risk estimation, but stress testing should not delay surgery if recommended. -No further testing needed  Patient does not have diagnosed COPD (per family) or OSA. Patient is at average risk for pulmonary complications.    2. Hypertension:  -Continue home BB and nifedipine  3. Hypothyroidism:  -Continue home levothyroxine  4. Hypokalemia:  -Fluids with K and continue home K -Trend BMP -Check Mag  5. Chronic normocytic anemia:  Stable.  At baseline. -Check ferritin -Transfusion threshold 8 mg/dL.     DVT prophylaxis: SCDs  Code Status: DO NOT RESUSCITATE  Family Communication: Daughter and husband, both POA, CODE STATUS confirmed.  Disposition Plan: Anticipate evaluation tomorrow by Orthopedics and possible surgical fixation of the left hip and SNF placement likely  afterwards. Consults called: Orthopedics Medical decision making: Patient seen at 2:02 AM on 05/28/2015.  The patient was discussed with jamie Ward-Pilcher, PA-C. I recommend admission to medical surgical unit, inpatient status.  Clinical condition: stable at present.      Edwin Dada [facility] Pager [phone]

## 2015-05-28 NOTE — Progress Notes (Signed)
This is a related, covered GIP admission from 05/28/15 to [facility] diagnosis of Alzheimer's Disease, per MD Monguilod.  Code status DNR.  She was admitted status post fall and sustained left hip fracture.  SN visited with pt.  She is lethargic and gimacing with pain.  O2 2L per [place]. Potassium was low at 3.0 on admission; she is currently receiving IV fluids - 0.9%NS w/ KCL 79mQ/L at rate of 744mhr.  Family at bedside; spouse and daughter.  They are still currently deciding plan of care to possibly proceed w/ surgery to left hip.  She has had pain medication 0.'5mg'$  Morphine x1 and (2) 5/'325mg'$  tabs of Norco this AM. [facility] will continue to follow up daily and anticipate any discharge needs.  Updated medication list and transfer summary placed on shadow chart.  Please call w/ any Hospice related questions.   [place] [phone]

## 2015-05-28 NOTE — Progress Notes (Signed)
No family was around this morning when I saw the patient.  I will speak with family regarding plan of care.    Azucena Cecil, MD [place] 8:51 AM

## 2015-05-28 NOTE — ED Notes (Signed)
This RN placed female urinal up to patient but pt is so confused that she does not understand. Family reports patient is usually incontinent and cant tell when she has to urinate. Female urinal held up to patient but patient did not urinate. Pt unable to tolerate bedpan due to severe pain with any movement.

## 2015-05-28 NOTE — Consult Note (Signed)
ORTHOPAEDIC CONSULTATION  REQUESTING PHYSICIAN: Jonetta Osgood, MD  Chief Complaint: Left femoral neck hip fracture  HPI: Alexa Hardin is a 82 y.o. female who presents with left hip fracture s/p mechanical fall. Patient has advanced dementia.  History is obtained from daughter.  Patient has been minimally ambulatory for the last several months.  Has h/o breast cancer.  Patient is in hospice care currently.  Prognosis is 6 months per daughter.  Ortho consulted.  Past Medical History  Diagnosis Date  . Hypertension   . Cancer ([place])     Breast/melanoma  . Dementia   . Alzheimer disease   . Unspecified protein-calorie malnutrition ([place])   . Thyroid disease   . Hyperlipemia    Past Surgical History  Procedure Laterality Date  . Lung removal, partial Left 2013  . Mastectomy Bilateral 2013  . Abdominal wall mass resection  06/15/2012   Social History   Social History  . Marital Status: Married    Spouse Name: Deidre Ala  . Number of Children: 1  . Years of Education: 16   Occupational History  . 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 Smoking status: Former Smoker -- 1.50 packs/day for 30 years    Quit date: 01/20/1983  . Smokeless tobacco: None  . Alcohol Use: 0.6 oz/week    1 Glasses of wine per week     Comment: every a few days   . Drug Use: No  . Sexual Activity: Not Asked   Other Topics Concern  . None   Social History Narrative   Lives at home with husband and daughter/son-in law   Caffeine use: 1 cup per day   Family History  Problem Relation Age of Onset  . Cancer Father     lung cancer  . Dementia Neg Hx    Allergies  Allergen Reactions  . Quinolones Rash    Ciprofloxacin.   Prior to Admission medications   Medication Sig Start Date End Date Taking? Authorizing Provider  aspirin 81 MG tablet Take 81 mg by mouth daily.   Yes Historical Provider, MD  atenolol (TENORMIN) 25 MG tablet Take 25 mg by mouth every  evening.   Yes Historical Provider, MD  atenolol (TENORMIN) 50 MG tablet Take 50 mg by mouth daily.   Yes Historical Provider, MD  bisacodyl (DULCOLAX) 10 MG suppository Place 10 mg rectally as needed for moderate constipation.   Yes Historical Provider, MD  Cholecalciferol (VITAMIN D3) 1000 UNITS CAPS Take 1 capsule by mouth daily.   Yes Historical Provider, MD  levothyroxine (SYNTHROID, LEVOTHROID) 137 MCG tablet Take 137 mcg by mouth daily before breakfast. Reported on 05/10/2015 03/06/15  Yes Historical Provider, MD  LORazepam (ATIVAN) 0.5 MG tablet Take 0.5 mg by mouth every 4 (four) hours as needed for anxiety.   Yes Historical Provider, MD  magnesium hydroxide (MILK OF MAGNESIA) 400 MG/5ML suspension Take 30 mLs by mouth daily as needed for mild constipation.   Yes Historical Provider, MD  mirtazapine (REMERON) 15 MG tablet TAKE 1 TABLET BY MOUTH AT BEDTIME 04/26/15  Yes Truitt Merle, MD  NIFEdipine (PROCARDIA-XL/ADALAT-CC/NIFEDICAL-XL) 30 MG 24 hr tablet Take 30 mg by mouth daily.   Yes Historical Provider, MD  potassium chloride (KLOR-CON) 20 MEQ packet Take 20 mEq by mouth daily.   Yes Historical Provider, MD  Probiotic Product (PROBIOTIC PO) Take 1 capsule by mouth daily.   Yes Historical Provider, MD  QUEtiapine (SEROQUEL) 25 MG tablet  Take 25 mg by mouth 2 (two) times daily. 05/06/15  Yes Historical Provider, MD  senna-docusate (SENEXON-S) 8.6-50 MG tablet Take 2 tablets by mouth 2 (two) times daily.   Yes Historical Provider, MD  Sodium Phosphates (RA SALINE ENEMA) 19-7 GM/118ML ENEM Place 1 each rectally as needed (for constipation).   Yes Historical Provider, MD  sorbitol 70 % solution Take 30 mLs by mouth at bedtime.  04/29/15  Yes Historical Provider, MD  letrozole (FEMARA) 2.5 MG tablet Take 1 tablet (2.5 mg total) by mouth daily. Patient not taking: Reported on 05/10/2015 03/20/15   Truitt Merle, MD  megestrol (MEGACE ES) 625 MG/5ML suspension Take 5 mLs (625 mg total) by mouth daily. Patient  not taking: Reported on 03/26/2015 09/03/14   Truitt Merle, MD   Dg Chest 1 View  05/27/2015  CLINICAL DATA:  Left hip fracture. Malignant melanoma in bilateral breast carcinoma. Pre-op respiratory exam EXAM: CHEST 1 VIEW COMPARISON:  03/17/2015 FINDINGS: Right-sided Port-A-Cath remains in appropriate position. Mild cardiomegaly is stable. Pulmonary hyperinflation is seen, consistent with COPD. No evidence of acute infiltrate or pleural effusion. IMPRESSION: Stable COPD and mild cardiomegaly.  No acute findings. Electronically Signed   By: Earle Gell M.D.   On: 05/27/2015 22:48   Ct Head Wo Contrast  05/28/2015  CLINICAL DATA:  Initial valuation for acute trauma, fall. EXAM: CT HEAD WITHOUT CONTRAST TECHNIQUE: Contiguous axial images were obtained from the base of the skull through the vertex without intravenous contrast. COMPARISON:  Prior study from 03/17/2015. FINDINGS: Diffuse prominence of the CSF containing spaces is compatible with generalized age-related cerebral atrophy. Patchy hypodensity within the periventricular and deep white matter most compatible with chronic small vessel ischemic disease. Vascular calcifications within the carotid siphons. Overall, these changes are stable. No acute intracranial hemorrhage. No acute large vessel territory infarct. No mass lesion, midline shift, or mass effect. Ventricular prominence related to global parenchymal volume loss present, stable. No hydrocephalus. No extra-axial fluid collection. Scalp soft tissues demonstrate no acute abnormality. No acute abnormality about the orbits. Paranasal sinuses are clear.  No mastoid effusion. Calvarium intact. IMPRESSION: 1. No acute intracranial process. 2. Moderate diffuse atrophy with chronic small vessel ischemic disease, stable. Electronically Signed   By: Jeannine Boga M.D.   On: 05/28/2015 01:25   Dg Hip Unilat With Pelvis 2-3 Views Left  05/27/2015  CLINICAL DATA:  Left hip injury and pain after jumping out of  wheelchair. Initial encounter. EXAM: DG HIP (WITH OR WITHOUT PELVIS) 2-3V LEFT COMPARISON:  None. FINDINGS: A subcapital left femoral neck fracture is seen. No evidence of dislocation. Generalized osteopenia noted. No pelvic fracture identified. Bipolar right hip prosthesis noted as well as generalized osteopenia. IMPRESSION: Subcapital left femoral neck fracture. Diffuse osteopenia. Electronically Signed   By: Earle Gell M.D.   On: 05/27/2015 22:46    All pertinent xrays, MRI, CT independently reviewed and interpreted  Positive ROS: All other systems have been reviewed and were otherwise negative with the exception of those mentioned in the HPI and as above.  Physical Exam: General: resting comfortably Cardiovascular: No pedal edema Respiratory: No cyanosis, no use of accessory musculature GI: No organomegaly, abdomen is soft and non-tender Skin: No lesions in the area of chief complaint Neurologic: Sensation intact distally Psychiatric: advanced dementia Lymphatic: No axillary or cervical lymphadenopathy  MUSCULOSKELETAL:  - withdraws to pain with movement of the hip and extremity - skin intact - NVI distally - compartments soft  Assessment: Left femoral neck hip  fracture Advanced dementia Hospice care  Plan: - had long discussion with family about their wishes for patient; decision to proceed with surgery or not would be purely for quality of life and to minimize pain with transfers and standing - patient's life expectancy is 6-12 months per family - I will check back with them tomorrow for their decision - she has been provisionally posted for surgery wed afternoon in case they decide to proceed with surgery   Thank you for the consult and the opportunity to see Ms. Walthour  N. Eduard Roux, MD [place] 1:31 PM

## 2015-05-28 NOTE — Progress Notes (Signed)
Pt transferred to unit from ED via RN x 1. Pt alert to self upon arrival. Pt denies pain, but is obvious discomfort. Facial grimacing and moaning noted. Pt resting in bed at lowest position, bed alarm on, safety mat placed on floor and call light in reach. All high fall risk protocol initiated. Foley inserted per written order. Vital signs within normal limits. Will continue to monitor. Fortino Sic, RN, BSN 05/28/2015 5:37 AM

## 2015-05-28 NOTE — Progress Notes (Signed)
Initial Nutrition Assessment  DOCUMENTATION CODES:   Underweight  INTERVENTION:  Diet advancement per MD Provide Ensure Enlive po BID when diet is advanced, each supplement provides 350 kcal and 20 grams of protein Use pudding and Magic cup ice cream in place of applesauce for medication administration   NUTRITION DIAGNOSIS:   Predicted suboptimal nutrient intake related to lethargy/confusion, acute illness as evidenced by NPO status, moderate depletions of muscle mass.   GOAL:   Patient will meet greater than or equal to 90% of their needs   MONITOR:   Diet advancement, PO intake, Supplement acceptance, Labs, Weight trends, Skin, I & O's  REASON FOR ASSESSMENT:   Consult Hip fracture protocol  ASSESSMENT:   82 y.o. female with a past medical history significant for severe dementia, HTN, hx of CVA who presents with fall and LEFT hip fracture.  Pt asleep at time of visit; stirred and moaned, but did not awake. She is NPO at this time. Very thin appearing. Limited nutrition-focused physical exam with moderate muscle and fat wasting noted. No nutrition supplements, aside from Vitamin D and probiotics, are listed in paper chart. Weight history shows that pt has lost 8 lbs in the past several months.   Labs: low magnesium, low hemoglobin  Diet Order:  Diet NPO time specified Except for: Sips with Meds, Ice Chips  Skin:  Reviewed, no issues  Last BM:  unknown  Height:   Ht Readings from Last 1 Encounters:  05/10/15 '5\' 9"'$  (1.753 m)    Weight:   Wt Readings from Last 1 Encounters:  05/28/15 112 lb 14.4 oz (51.211 kg)    Ideal Body Weight:  65.9 kg  BMI:  Body mass index is 16.66 kg/(m^2).  Estimated Nutritional Needs:   Kcal:  1500-1700  Protein:  70-80 grams  Fluid:  >/= 1.5 L/day  EDUCATION NEEDS:   No education needs identified at this time  [place], LDN Inpatient Clinical Dietitian Pager: [phone] After Hours Pager: [phone]

## 2015-05-28 NOTE — Care Management Note (Signed)
Case Management Note  Patient Details  Name: Alexa Hardin MRN: [id number] Date of Birth: 1932-03-20  Subjective/Objective:                    Action/Plan: Patient was admitted with a left hip fracture s/p fall at [facility].  Patient was active with hospice services prior to admission.  CM will follow for discharge needs pending patient's progress and disposition. CSW is aware that patient was admitted from SNF.  Expected Discharge Date:                  Expected Discharge Plan:     In-House Referral:     Discharge planning Services     Post Acute Care Choice:    Choice offered to:     DME Arranged:    DME Agency:     HH Arranged:    HH Agency:     Status of Service:  In process, will continue to follow  Medicare Important Message Given:    Date Medicare IM Given:    Medicare IM give by:    Date Additional Medicare IM Given:    Additional Medicare Important Message give by:     If discussed at [place] of Stay Meetings, dates discussed:    Additional Comments:  Rolm Baptise, RN 05/28/2015, 10:38 AM [phone]

## 2015-05-28 NOTE — Progress Notes (Signed)
Hospice and [place] MSW note: Pt is a current hospice patient at home. Pt is a DNR. Pt lives with spouse-Ralph in the in law suite in daughter Pamela's home. Pt was at [facility] for a 5 night respite when she fell. Pt was originally scheduled to come home today from her respite stay. Met with daughter and spouse in the room. Pt slept during the visit. Pt appeared comfortable. Family is eager to discuss with orthopedic doctor about options for pt's care. MSW did discuss with family that if they choose SNF for care then [facility] would discharge. MSW will follow up with family after they are presented options for pt's care to further discuss hospital discharge plans. MSW discussed case with San Jetty, staff RN.   Marilynne Halsted, MSW

## 2015-05-29 MED ORDER — ENSURE ENLIVE PO LIQD
237.0000 mL | Freq: Two times a day (BID) | ORAL | Status: DC
  Filled 2015-05-29 (×2): qty 237

## 2015-05-29 MED ORDER — ENSURE ENLIVE PO LIQD
237.0000 mL | Freq: Two times a day (BID) | ORAL | Status: DC
  Administered 2015-05-29 – 2015-06-04 (×3): 237 mL via ORAL
  Filled 2015-05-29 (×15): qty 237

## 2015-05-29 NOTE — Progress Notes (Signed)
Family has decided to proceed with surgery but to postpone until Thursday. Patient may eat today.  NPO after midnight.  Azucena Cecil, MD [place] 10:59 AM

## 2015-05-29 NOTE — Progress Notes (Signed)
No family this morning on rounds.  I've left my number with RN so family can contact me when they arrive today.  Surgery is scheduled for later this afternoon.  Azucena Cecil, MD [place] 7:53 AM

## 2015-05-29 NOTE — Progress Notes (Signed)
[facility] MSW note: This is a hospice related admission. Pt is a DNR. No family present. Pt was crying throughout visit and was not consolable. Pt had a grimace on her face and put her left hand to her left hip 2x during the visit. Pt was anxious on visit. MSW met with San Jetty, staff RN to discuss pt's symptoms. RN gave pt ativan and pain medication during visit while this MSW held pt's hands. MSW provided therapy to help pt get calm and relax. Pt remained anxious and crying upon departure. Family has not decided on surgery yet. Staff RN stated that surgery is scheduled later this afternoon if family decides to do surgery. MSW discussed with Lanetta Inch, RN liaison. Please call with questions or concerns. Thank you.   Marilynne Halsted, MSW

## 2015-05-29 NOTE — NC FL2 (Signed)
[place] LEVEL OF CARE SCREENING TOOL     IDENTIFICATION  Patient Name: Alexa Hardin Birthdate: [birth year] Sex: female Admission Date (Current Location): 05/27/2015  [facility] and [place] Number:  [employer] and Address:  [facility], [place] [address], [place], [place] [address]      Provider Number: [id number]  Attending Physician Name and Address:  Jonetta Osgood, MD  Relative Name and Phone Number:       Current Level of Care: Hospital Recommended Level of Care: [place] Prior Approval Number:    Date Approved/Denied:   PASRR Number: [id number]  Discharge Plan: SNF    Current Diagnoses: Patient Active Problem List   Diagnosis Date Noted  . Hypertension 05/28/2015  . Hypokalemia 05/28/2015  . Closed subcapital fracture of neck of left femur ([place]) 05/28/2015  . Closed subcapital fracture of neck of femur ([place]) 05/28/2015  . Dehydration 03/27/2015  . Weakness 03/27/2015  . Constipation 03/27/2015  . Hypoalbuminemia due to protein-calorie malnutrition ([place]) 03/27/2015  . Acute kidney injury ([place]) 01/30/2015  . Hypothyroidism 01/30/2015  . Aspiration pneumonia ([place]) 01/30/2015  . Dementia with behavioral disturbance 11/14/2014  . Alzheimer's disease 11/14/2014  . COPD (chronic obstructive pulmonary disease) ([place]) 11/14/2014  . Malignant melanoma of unknown origin ([place]) 08/28/2014  . Breast cancer, right breast ([place]) 08/28/2014  . Breast cancer, left breast ([place]) 08/28/2014    Orientation RESPIRATION BLADDER Height & Weight     Self  O2 (Nasal cannula 2L) Continent, Indwelling catheter (Urinary catheter) Weight: 51.211 kg (112 lb 14.4 oz) Height:     BEHAVIORAL SYMPTOMS/MOOD NEUROLOGICAL BOWEL NUTRITION STATUS   (Dementia; no behaviors)   Continent Diet (Please see Dc summary)  AMBULATORY STATUS COMMUNICATION OF NEEDS Skin   Extensive Assist Verbally Normal (Will have surgical incision after  surgery)                       Personal Care Assistance Level of Assistance  Bathing, Feeding, Dressing Bathing Assistance: Maximum assistance Feeding assistance: Maximum assistance Dressing Assistance: Maximum assistance     Functional Limitations Info             SPECIAL CARE FACTORS FREQUENCY  PT (By licensed PT)     PT Frequency: not yet assessed              Contractures      Additional Factors Info  Code Status, Psychotropic Code Status Info: DNR Allergies Info: Quinolones Psychotropic Info: Seroquel         Current Medications (05/29/2015):  This is the current hospital active medication list Current Facility-Administered Medications  Medication Dose Route Frequency Provider Last Rate Last Dose  . 0.9 % NaCl with KCl 40 mEq / L  infusion   Intravenous Continuous Jonetta Osgood, MD 40 mL/hr at 05/29/15 1343 40 mL/hr at 05/29/15 1343  . atenolol (TENORMIN) tablet 25 mg  25 mg Oral QPM Edwin Dada, MD   25 mg at 05/28/15 1848  . atenolol (TENORMIN) tablet 50 mg  50 mg Oral Daily Edwin Dada, MD   50 mg at 05/28/15 1012  . bisacodyl (DULCOLAX) suppository 10 mg  10 mg Rectal Daily PRN Edwin Dada, MD      . docusate sodium (COLACE) capsule 100 mg  100 mg Oral BID Edwin Dada, MD   100 mg at 05/28/15 1012  . feeding supplement (ENSURE ENLIVE) (ENSURE ENLIVE) liquid 237  mL  237 mL Oral BID BM Shanker Kristeen Mans, MD      . HYDROcodone-acetaminophen (NORCO/VICODIN) 5-325 MG per tablet 1-2 tablet  1-2 tablet Oral Q6H PRN Edwin Dada, MD   1 tablet at 05/29/15 1508  . levothyroxine (SYNTHROID, LEVOTHROID) tablet 137 mcg  137 mcg Oral QAC breakfast Edwin Dada, MD   137 mcg at 05/29/15 0846  . LORazepam (ATIVAN) tablet 0.5 mg  0.5 mg Oral Q4H PRN Edwin Dada, MD   0.5 mg at 05/29/15 1508  . mirtazapine (REMERON) tablet 15 mg  15 mg Oral QHS Edwin Dada, MD   15 mg at 05/28/15 2210  .  morphine 2 MG/ML injection 0.5 mg  0.5 mg Intravenous Q2H PRN Edwin Dada, MD   0.5 mg at 05/28/15 0518  . NIFEdipine (PROCARDIA-XL/ADALAT-CC/NIFEDICAL-XL) 24 hr tablet 30 mg  30 mg Oral Daily Edwin Dada, MD   30 mg at 05/28/15 1014  . polyethylene glycol (MIRALAX / GLYCOLAX) packet 17 g  17 g Oral Daily PRN Edwin Dada, MD      . potassium chloride SA (K-DUR,KLOR-CON) CR tablet 20 mEq  20 mEq Oral Daily Edwin Dada, MD   20 mEq at 05/28/15 1011  . QUEtiapine (SEROQUEL) tablet 25 mg  25 mg Oral BID Edwin Dada, MD   25 mg at 05/28/15 2211  . senna-docusate (Senokot-S) tablet 2 tablet  2 tablet Oral BID Edwin Dada, MD   2 tablet at 05/28/15 1011     Discharge Medications: Please see discharge summary for a list of discharge medications.  Relevant Imaging Results:  Relevant Lab Results:   Additional Information SSN: [place], [place]

## 2015-05-29 NOTE — Progress Notes (Signed)
PROGRESS NOTE        PATIENT DETAILS Name: Alexa Hardin Age: 82 y.o. Sex: female Date of Birth: 30-Aug-1932 Admit Date: 05/27/2015 Admitting Physician Edwin Dada, MD FVC:[id number],FMBWG F, MD Outpatient Specialists:Dr Burr Medico  Brief Narrative: Patient is a 82 y.o. female with hx of breast cancer, stage 4 Melanoma, advanced dementia being followed by hospice at home, presented to the ED after a mechanical fall at [facility] she was for respite care. Further evaluation revealed left hip fracture.   Subjective: Sleeping comfortably-but awakens-pleasantly confused.  Assessment/Plan: Principal Problem: Closed subcapital fracture of neck of left femur:Secondary to a mechanical fall-patient very minimally ambulatory with very advanced dementia-family contemplated non operative vs operative treatment,however after further d/w Ortho- Dr Erlinda Hong -plans are to pursue to operative treatment-tentatively scheduled for 5/11. Continue supportive care.  Active Problems: Dementia with behavioral disturbance:currently-pleasantly confused-occasionally follows commands. Continue supportive care with Seroquel,prn Lorazepam  Hypothyroidism:continue synthroid  Hypertension:moderate control-fluctuating-cotninue Atenolol and Nifedipine  Hypokalemia:replete and recheck  Hx of B/L Breast cancer/Stage 4 Melanoma:followed by Dr Ina Homes outpatient Oncology notes-ast CT scan on 03/07/2015 showed no evidence of disease.She is enrolled in hospice program in early 03/2015 due to her overall deconditioning and worsening fatigue and dementia   DVT Prophylaxis: SCD's  Code Status: DNR  Family Communication: NONE at bedside  Disposition Plan: Remain inpatient-suspect will require SNF with palliative/hospice follow up on discharge  Antimicrobial agents: None  Procedures: None  CONSULTS:  orthopedic surgery  Time spent: 25- minutes-Greater than 50% of this time was spent  in counseling, explanation of diagnosis, planning of further management, and coordination of care.  MEDICATIONS: Anti-infectives    None      Scheduled Meds: . atenolol  25 mg Oral QPM  . atenolol  50 mg Oral Daily  . docusate sodium  100 mg Oral BID  . levothyroxine  137 mcg Oral QAC breakfast  . mirtazapine  15 mg Oral QHS  . NIFEdipine  30 mg Oral Daily  . potassium chloride  20 mEq Oral Daily  . QUEtiapine  25 mg Oral BID  . senna-docusate  2 tablet Oral BID   Continuous Infusions: . 0.9 % NaCl with KCl 40 mEq / L 75 mL/hr (05/28/15 0509)   PRN Meds:.bisacodyl, HYDROcodone-acetaminophen, LORazepam, morphine injection, polyethylene glycol   PHYSICAL EXAM: Vital signs: Filed Vitals:   05/28/15 1819 05/28/15 2108 05/29/15 0128 05/29/15 0531  BP: 139/74 162/79 159/73 173/69  Pulse: 84 93 89 78  Temp: 98.2 F (36.8 C) 98.5 F (36.9 C) 98.1 F (36.7 C) 98.6 F (37 C)  TempSrc: Axillary Axillary Oral Oral  Resp: '20 20 20 20  '$ Weight:      SpO2: 100% 100% 100% 100%   Filed Weights   05/28/15 0433  Weight: 51.211 kg (112 lb 14.4 oz)   Body mass index is 16.66 kg/(m^2).   Gen Exam: Awake, pleasantly confused. Appears in mild distress from pain Neck: Supple, No JVD.   Chest: B/L Clear.   CVS: S1 S2 Regular Abdomen: soft, BS +, non tender, non distended.  Extremities: no edema, lower extremities warm to touch. Neurologic: Moves all 4 ext Skin: No Rash or lesions   Wounds: N/A.    LABORATORY DATA: CBC:  Recent Labs Lab 05/27/15 2249 05/28/15 0835  WBC 8.5 9.6  NEUTROABS 7.1  --   HGB 11.6* 11.2*  HCT  36.7 36.7  MCV 81.0 81.0  PLT 327 660    Basic Metabolic Panel:  Recent Labs Lab 05/27/15 2249 05/28/15 0835  NA 143 145  K 3.0* 3.7  CL 102 107  CO2 27 27  GLUCOSE 100* 97  BUN 20 16  CREATININE 0.72 0.72  CALCIUM 9.3 8.9  MG  --  1.5*    GFR: Estimated Creatinine Clearance: 45.3 mL/min (by C-G formula based on Cr of 0.72).  Liver  Function Tests: No results for input(s): AST, ALT, ALKPHOS, BILITOT, PROT, ALBUMIN in the last 168 hours. No results for input(s): LIPASE, AMYLASE in the last 168 hours. No results for input(s): AMMONIA in the last 168 hours.  Coagulation Profile:  Recent Labs Lab 05/27/15 2249  INR 1.04    Cardiac Enzymes: No results for input(s): CKTOTAL, CKMB, CKMBINDEX, TROPONINI in the last 168 hours.  BNP (last 3 results) No results for input(s): PROBNP in the last 8760 hours.  HbA1C: No results for input(s): HGBA1C in the last 72 hours.  CBG: No results for input(s): GLUCAP in the last 168 hours.  Lipid Profile: No results for input(s): CHOL, HDL, LDLCALC, TRIG, CHOLHDL, LDLDIRECT in the last 72 hours.  Thyroid Function Tests: No results for input(s): TSH, T4TOTAL, FREET4, T3FREE, THYROIDAB in the last 72 hours.  Anemia Panel:  Recent Labs  05/28/15 0835  FERRITIN 52    Urine analysis:    Component Value Date/Time   COLORURINE YELLOW 03/17/2015 [place] 03/17/2015 1644   LABSPEC 1.010 03/17/2015 1644   PHURINE 7.0 03/17/2015 1644   GLUCOSEU NEGATIVE 03/17/2015 1644   HGBUR NEGATIVE 03/17/2015 1644   BILIRUBINUR NEGATIVE 03/17/2015 [place] 03/17/2015 1644   PROTEINUR NEGATIVE 03/17/2015 1644   NITRITE NEGATIVE 03/17/2015 1644   LEUKOCYTESUR NEGATIVE 03/17/2015 1644    Sepsis Labs: Lactic Acid, Venous No results found for: LATICACIDVEN  MICROBIOLOGY: No results found for this or any previous visit (from the past 240 hour(s)).  RADIOLOGY STUDIES/RESULTS: Dg Chest 1 View  05/27/2015  CLINICAL DATA:  Left hip fracture. Malignant melanoma in bilateral breast carcinoma. Pre-op respiratory exam EXAM: CHEST 1 VIEW COMPARISON:  03/17/2015 FINDINGS: Right-sided Port-A-Cath remains in appropriate position. Mild cardiomegaly is stable. Pulmonary hyperinflation is seen, consistent with COPD. No evidence of acute infiltrate or pleural effusion.  IMPRESSION: Stable COPD and mild cardiomegaly.  No acute findings. Electronically Signed   By: Earle Gell M.D.   On: 05/27/2015 22:48   Ct Head Wo Contrast  05/28/2015  CLINICAL DATA:  Initial valuation for acute trauma, fall. EXAM: CT HEAD WITHOUT CONTRAST TECHNIQUE: Contiguous axial images were obtained from the base of the skull through the vertex without intravenous contrast. COMPARISON:  Prior study from 03/17/2015. FINDINGS: Diffuse prominence of the CSF containing spaces is compatible with generalized age-related cerebral atrophy. Patchy hypodensity within the periventricular and deep white matter most compatible with chronic small vessel ischemic disease. Vascular calcifications within the carotid siphons. Overall, these changes are stable. No acute intracranial hemorrhage. No acute large vessel territory infarct. No mass lesion, midline shift, or mass effect. Ventricular prominence related to global parenchymal volume loss present, stable. No hydrocephalus. No extra-axial fluid collection. Scalp soft tissues demonstrate no acute abnormality. No acute abnormality about the orbits. Paranasal sinuses are clear.  No mastoid effusion. Calvarium intact. IMPRESSION: 1. No acute intracranial process. 2. Moderate diffuse atrophy with chronic small vessel ischemic disease, stable. Electronically Signed   By: Pincus Badder.D.  On: 05/28/2015 01:25   Dg Hip Unilat With Pelvis 2-3 Views Left  05/27/2015  CLINICAL DATA:  Left hip injury and pain after jumping out of wheelchair. Initial encounter. EXAM: DG HIP (WITH OR WITHOUT PELVIS) 2-3V LEFT COMPARISON:  None. FINDINGS: A subcapital left femoral neck fracture is seen. No evidence of dislocation. Generalized osteopenia noted. No pelvic fracture identified. Bipolar right hip prosthesis noted as well as generalized osteopenia. IMPRESSION: Subcapital left femoral neck fracture. Diffuse osteopenia. Electronically Signed   By: Earle Gell M.D.   On: 05/27/2015  22:46     LOS: 1 day   Oren Binet, MD  [facility] Pager:[phone]  If 7PM-7AM, please contact night-coverage [email or web address] Password [email or web address] 05/29/2015, 1:04 PM

## 2015-05-29 NOTE — H&P (Signed)

## 2015-05-29 NOTE — Progress Notes (Signed)
[facility]20[facility] RN Visit  This is a related, covered GIP admission from 05/28/15 to [facility] diagnosis of Alzheimer's Disease, per MD Mong[place]od. Code status  is DNR. She was admitted status post fall and sustained left hip fracture. Patient seen in room, resting comfortably in bed.  She is sleeping soundly and did not arouse to verbal stimuli.  She is on 2L [place] with O2 sat at 100% and respirations at 20.  Per staff RN, patient has been inttermently agitated when awake.  Patient has received 4 doses of 5/325 mg Vicodin tablets po for pain, and 3 doses of 0.5 mg Ativan for anxiety in the past 24 hours.  She has also received 1 dose of 0.5 mg Morphine for severe pain. Patient has been NPO. Husband, Deidre Ala at bedside.  He reflected on his marriage with his wife and verbalized he hopes the surgery will relieve her pain.  Offered emotional support. He stated they have decided to go through with surgery tomorrow.  Left contact information with husband if any needs arise.  [facility] will continue to follow daily.  Please call w/ any hospice-related questions or concerns.   Freddi Starr RN, [facility] Liaison [phone]

## 2015-05-29 NOTE — Clinical Social Work Note (Signed)
Clinical Social Work Assessment  Patient Details  Name: Alexa Hardin MRN: [id number] Date of Birth: Jan 28, 1932  Date of referral:  05/29/15               Reason for consult:  Facility Placement                Permission sought to share information with:  Facility Sport and exercise psychologist, Family Supports Permission granted to share information::  No (Patient is disoriented with dementia; completed assessment with daughter)  Name::     Conservation officer, historic buildings::  SNFs  Relationship::  Daughter  Contact Information:  [phone]  Housing/Transportation Living arrangements for the past 2 months:  Single Family Home Source of Information:  Adult Children Patient Interpreter Needed:  None Criminal Activity/Legal Involvement Pertinent to Current Situation/Hospitalization:  No - Comment as needed Significant Relationships:  Adult Children, Spouse Lives with:  Spouse Do you feel safe going back to the place where you live?  No Need for family participation in patient care:  Yes (Comment)  Care giving concerns:  CSW received consult regarding SNF placement after patient has femur surgery. Patient is very disoriented. CSW completed assessment with patient's daughter, [name]. She stated the patient would need SNF at discharge to get stronger. Patient was admitted to hospital from [place]. CSW to continue to follow for needs.   Social Worker assessment / plan:  CSW spoke with patient's daughter concerning possibility of rehab at [facility] before returning home.  Employment status:  Retired Nurse, adult PT Recommendations:  Not assessed at this time Information / Referral to community resources:  [place]  Patient/Family's Response to care:  Patient's daughter recognizes need for rehab before returning home and is agreeable to a SNF in [place].  Patient/Family's Understanding of and Emotional Response to Diagnosis, Current Treatment, and Prognosis:   Patient's family is realistic regarding therapy needs. No questions/concerns about plan or treatment.    Emotional Assessment Appearance:  Appears stated age Attitude/Demeanor/Rapport:  Unable to Assess, Crying Affect (typically observed):  Unable to Assess Orientation:  Oriented to Self Alcohol / Substance use:  Not Applicable Psych involvement (Current and /or in the community):  No (Comment)  Discharge Needs  Concerns to be addressed:  Care Coordination Readmission within the last 30 days:  No Current discharge risk:  None Barriers to Discharge:  Continued Medical Work up   [employer], Latanya Presser 05/29/2015, 4:37 PM

## 2015-05-29 NOTE — Clinical Social Work Placement (Signed)
   CLINICAL SOCIAL WORK PLACEMENT  NOTE  Date:  05/29/2015  Patient Details  Name: [name] MRN: [id number] Date of Birth: Mar 12, 1932  Clinical Social Work is seeking post-discharge placement for this patient at the [place] level of care (*CSW will initial, date and re-position this form in  chart as items are completed):      Patient/family provided with [place] Work Department's list of facilities offering this level of care within the geographic area requested by the patient (or if unable, by the patient's family).      Patient/family informed of their freedom to choose among providers that offer the needed level of care, that participate in Medicare, Medicaid or managed care program needed by the patient, have an available bed and are willing to accept the patient.      Patient/family informed of [facility]'s ownership interest in [facility] and [facility], as well as of the fact that they are under no obligation to receive care at these facilities.  PASRR submitted to EDS on       PASRR number received on       Existing PASRR number confirmed on 05/29/15     FL2 transmitted to all facilities in geographic area requested by pt/family on 05/29/15     FL2 transmitted to all facilities within larger geographic area on       Patient informed that his/her managed care company has contracts with or will negotiate with certain facilities, including the following:            Patient/family informed of bed offers received.  Patient chooses bed at       Physician recommends and patient chooses bed at      Patient to be transferred to   on  .  Patient to be transferred to facility by       Patient family notified on   of transfer.  Name of family member notified:        PHYSICIAN Please sign FL2     Additional Comment:    _______________________________________________ Benard Halsted, [place] 05/29/2015, 4:39 PM

## 2015-05-30 ENCOUNTER — Inpatient Hospital Stay (HOSPITAL_COMMUNITY): Admitting: Anesthesiology

## 2015-05-30 ENCOUNTER — Encounter (HOSPITAL_COMMUNITY): Payer: Self-pay | Admitting: Anesthesiology

## 2015-05-30 ENCOUNTER — Inpatient Hospital Stay (HOSPITAL_COMMUNITY)

## 2015-05-30 ENCOUNTER — Encounter (HOSPITAL_COMMUNITY)
Admission: EM | Disposition: A | Discharge status: Hospice | Payer: Self-pay | Source: Home / Self Care | Attending: Internal Medicine

## 2015-05-30 HISTORY — PX: ANTERIOR APPROACH HEMI HIP ARTHROPLASTY: SHX6690

## 2015-05-30 LAB — CREATININE, SERUM
Creatinine, Ser: 0.76 mg/dL (ref 0.44–1.00)
GFR calc non Af Amer: >60 mL/min (ref >60)

## 2015-05-30 LAB — SURGICAL PCR SCREEN
MRSA, PCR: NEGATIVE
STAPHYLOCOCCUS AUREUS: NEGATIVE

## 2015-05-30 LAB — CBC
HCT: 33.1 % — ABNORMAL LOW (ref 36.0–46.0)
Hemoglobin: 10.3 g/dL — ABNORMAL LOW (ref 12.0–15.0)
MCH: 24.9 pg — ABNORMAL LOW (ref 26.0–34.0)
MCHC: 31.1 g/dL (ref 30.0–36.0)
MCV: 80 fL (ref 78.0–100.0)
PLATELETS: 223 10*3/uL (ref 150–400)
RBC: 4.14 MIL/uL (ref 3.87–5.11)
RDW: 14.5 % (ref 11.5–15.5)
WBC: 8.9 10*3/uL (ref 4.0–10.5)

## 2015-05-30 SURGERY — HEMIARTHROPLASTY, HIP, DIRECT ANTERIOR APPROACH, FOR FRACTURE
Anesthesia: General | Site: Hip | Laterality: Left

## 2015-05-30 MED ORDER — MENTHOL 3 MG MT LOZG: 1.0000 | LOZENGE | OROMUCOSAL | Status: DC | PRN

## 2015-05-30 MED ORDER — FENTANYL CITRATE (PF) 100 MCG/2ML IJ SOLN
INTRAMUSCULAR | Status: AC
  Filled 2015-05-30: qty 2

## 2015-05-30 MED ORDER — ONDANSETRON HCL 4 MG/2ML IJ SOLN
INTRAMUSCULAR | Status: AC
  Filled 2015-05-30: qty 2

## 2015-05-30 MED ORDER — PHENOL 1.4 % MT LIQD: 1.0000 | OROMUCOSAL | Status: DC | PRN

## 2015-05-30 MED ORDER — ONDANSETRON HCL 4 MG/2ML IJ SOLN: 4.0000 mg | Freq: Four times a day (QID) | INTRAMUSCULAR | Status: DC | PRN

## 2015-05-30 MED ORDER — PROPOFOL 10 MG/ML IV BOLUS
INTRAVENOUS | Status: DC | PRN
  Administered 2015-05-30: 110 mg via INTRAVENOUS

## 2015-05-30 MED ORDER — ACETAMINOPHEN 650 MG RE SUPP: 650.0000 mg | Freq: Four times a day (QID) | RECTAL | Status: DC | PRN

## 2015-05-30 MED ORDER — 0.9 % SODIUM CHLORIDE (POUR BTL) OPTIME
TOPICAL | Status: DC | PRN
  Administered 2015-05-30: 1000 mL

## 2015-05-30 MED ORDER — FENTANYL CITRATE (PF) 100 MCG/2ML IJ SOLN
25.0000 ug | INTRAMUSCULAR | Status: DC | PRN
  Administered 2015-05-30 (×4): 25 ug via INTRAVENOUS

## 2015-05-30 MED ORDER — METHOCARBAMOL 500 MG PO TABS: 500.0000 mg | ORAL_TABLET | Freq: Four times a day (QID) | ORAL | Status: DC | PRN

## 2015-05-30 MED ORDER — FENTANYL CITRATE (PF) 100 MCG/2ML IJ SOLN
INTRAMUSCULAR | Status: DC | PRN
  Administered 2015-05-30: 50 ug via INTRAVENOUS

## 2015-05-30 MED ORDER — SODIUM CHLORIDE 0.9 % IV SOLN
INTRAVENOUS | Status: DC
  Administered 2015-05-30 – 2015-06-01 (×3): via INTRAVENOUS

## 2015-05-30 MED ORDER — HYDROCODONE-ACETAMINOPHEN 7.5-325 MG PO TABS: 1.0000 | ORAL_TABLET | Freq: Four times a day (QID) | ORAL | Status: DC | PRN

## 2015-05-30 MED ORDER — ACETAMINOPHEN 325 MG PO TABS
650.0000 mg | ORAL_TABLET | Freq: Four times a day (QID) | ORAL | Status: DC | PRN
  Administered 2015-05-31: 650 mg via ORAL
  Filled 2015-05-30: qty 2

## 2015-05-30 MED ORDER — DEXTROSE 5 % IV SOLN: 500.0000 mg | Freq: Four times a day (QID) | INTRAVENOUS | Status: DC | PRN

## 2015-05-30 MED ORDER — NEOSTIGMINE METHYLSULFATE 5 MG/5ML IV SOSY
PREFILLED_SYRINGE | INTRAVENOUS | Status: AC
  Filled 2015-05-30: qty 5

## 2015-05-30 MED ORDER — METOCLOPRAMIDE HCL 5 MG PO TABS: 5.0000 mg | ORAL_TABLET | Freq: Three times a day (TID) | ORAL | Status: DC | PRN

## 2015-05-30 MED ORDER — ROCURONIUM BROMIDE 100 MG/10ML IV SOLN
INTRAVENOUS | Status: DC | PRN
  Administered 2015-05-30: 30 mg via INTRAVENOUS

## 2015-05-30 MED ORDER — LACTATED RINGERS IV SOLN
INTRAVENOUS | Status: DC
  Administered 2015-05-30: 16:00:00 via INTRAVENOUS

## 2015-05-30 MED ORDER — ENOXAPARIN SODIUM 40 MG/0.4ML ~~LOC~~ SOLN
40.0000 mg | SUBCUTANEOUS | Status: DC
  Administered 2015-05-31 – 2015-06-04 (×5): 40 mg via SUBCUTANEOUS
  Filled 2015-05-30 (×5): qty 0.4

## 2015-05-30 MED ORDER — SODIUM CHLORIDE 0.9 % IR SOLN
Status: DC | PRN
  Administered 2015-05-30: 1000 mL
  Administered 2015-05-30: 3000 mL

## 2015-05-30 MED ORDER — EPHEDRINE SULFATE 50 MG/ML IJ SOLN
INTRAMUSCULAR | Status: DC | PRN
  Administered 2015-05-30: 10 mg via INTRAVENOUS

## 2015-05-30 MED ORDER — TRANEXAMIC ACID 1000 MG/10ML IV SOLN
2000.0000 mg | Freq: Once | INTRAVENOUS | Status: DC
  Filled 2015-05-30: qty 20

## 2015-05-30 MED ORDER — HYDRALAZINE HCL 20 MG/ML IJ SOLN: 10.0000 mg | Freq: Four times a day (QID) | INTRAMUSCULAR | Status: DC | PRN

## 2015-05-30 MED ORDER — TRANEXAMIC ACID 1000 MG/10ML IV SOLN
2000.0000 mg | INTRAVENOUS | Status: DC | PRN
  Administered 2015-05-30: 2000 mg via TOPICAL

## 2015-05-30 MED ORDER — HYDROCODONE-ACETAMINOPHEN 5-325 MG PO TABS: 1.0000 | ORAL_TABLET | Freq: Four times a day (QID) | ORAL | Status: DC | PRN

## 2015-05-30 MED ORDER — FENTANYL CITRATE (PF) 100 MCG/2ML IJ SOLN
50.0000 ug | Freq: Once | INTRAMUSCULAR | Status: AC
  Administered 2015-05-30: 50 ug via INTRAVENOUS
  Filled 2015-05-30: qty 1

## 2015-05-30 MED ORDER — NEOSTIGMINE METHYLSULFATE 10 MG/10ML IV SOLN
INTRAVENOUS | Status: DC | PRN
  Administered 2015-05-30: 2 mg via INTRAVENOUS

## 2015-05-30 MED ORDER — OXYCODONE HCL 5 MG PO TABS
5.0000 mg | ORAL_TABLET | ORAL | Status: DC | PRN
Start: 2015-05-30 — End: 2015-06-04
  Administered 2015-05-31 – 2015-06-02 (×3): 5 mg via ORAL
  Administered 2015-06-03: 10 mg via ORAL
  Filled 2015-05-30 (×4): qty 1
  Filled 2015-05-30: qty 2

## 2015-05-30 MED ORDER — MORPHINE SULFATE (PF) 2 MG/ML IV SOLN
0.5000 mg | INTRAVENOUS | Status: DC | PRN
  Administered 2015-05-31: 0.5 mg via INTRAVENOUS
  Filled 2015-05-30: qty 1

## 2015-05-30 MED ORDER — CEFAZOLIN SODIUM 1 G IJ SOLR
INTRAMUSCULAR | Status: DC | PRN
  Administered 2015-05-30: 2 g via INTRAMUSCULAR

## 2015-05-30 MED ORDER — CEFAZOLIN SODIUM-DEXTROSE 2-4 GM/100ML-% IV SOLN
2.0000 g | Freq: Four times a day (QID) | INTRAVENOUS | Status: AC
  Administered 2015-05-30 – 2015-05-31 (×3): 2 g via INTRAVENOUS
  Filled 2015-05-30 (×3): qty 100

## 2015-05-30 MED ORDER — METOCLOPRAMIDE HCL 5 MG/ML IJ SOLN: 5.0000 mg | Freq: Three times a day (TID) | INTRAMUSCULAR | Status: DC | PRN

## 2015-05-30 MED ORDER — ONDANSETRON HCL 4 MG/2ML IJ SOLN
INTRAMUSCULAR | Status: DC | PRN
  Administered 2015-05-30: 4 mg via INTRAVENOUS

## 2015-05-30 MED ORDER — ENOXAPARIN SODIUM 40 MG/0.4ML ~~LOC~~ SOLN: 40.0000 mg | Freq: Every day | SUBCUTANEOUS | Status: AC

## 2015-05-30 MED ORDER — ONDANSETRON HCL 4 MG PO TABS: 4.0000 mg | ORAL_TABLET | Freq: Four times a day (QID) | ORAL | Status: DC | PRN

## 2015-05-30 MED ORDER — GLYCOPYRROLATE 0.2 MG/ML IJ SOLN
INTRAMUSCULAR | Status: DC | PRN
  Administered 2015-05-30: 0.3 mg via INTRAVENOUS

## 2015-05-30 MED ORDER — ALUM & MAG HYDROXIDE-SIMETH 200-200-20 MG/5ML PO SUSP: 30.0000 mL | ORAL | Status: DC | PRN

## 2015-05-30 MED ORDER — PHENYLEPHRINE HCL 10 MG/ML IJ SOLN
INTRAMUSCULAR | Status: DC | PRN
  Administered 2015-05-30 (×5): 80 ug via INTRAVENOUS

## 2015-05-30 MED ORDER — GLYCOPYRROLATE 0.2 MG/ML IV SOSY
PREFILLED_SYRINGE | INTRAVENOUS | Status: AC
  Filled 2015-05-30: qty 3

## 2015-05-30 MED ORDER — FENTANYL CITRATE (PF) 250 MCG/5ML IJ SOLN
INTRAMUSCULAR | Status: AC
  Filled 2015-05-30: qty 5

## 2015-05-30 SURGICAL SUPPLY — 54 items
BNDG COHESIVE 4X5 TAN STRL (GAUZE/BANDAGES/DRESSINGS) ×3 IMPLANT
CAPT HIP HEMI 2 ×3 IMPLANT
CELLS DAT CNTRL 66122 CELL SVR (MISCELLANEOUS) ×1 IMPLANT
CLOSURE STERI-STRIP 1/2X4 (GAUZE/BANDAGES/DRESSINGS) ×1
CLSR STERI-STRIP ANTIMIC 1/2X4 (GAUZE/BANDAGES/DRESSINGS) ×2 IMPLANT
COVER SURGICAL LIGHT HANDLE (MISCELLANEOUS) ×3 IMPLANT
DRAPE C-ARM 42X72 X-RAY (DRAPES) ×3 IMPLANT
DRAPE IMP U-DRAPE 54X76 (DRAPES) ×3 IMPLANT
DRAPE STERI IOBAN 125X83 (DRAPES) ×3 IMPLANT
DRAPE U-SHAPE 47X51 STRL (DRAPES) ×9 IMPLANT
DRSG AQUACEL AG ADV 3.5X10 (GAUZE/BANDAGES/DRESSINGS) ×3 IMPLANT
DURAPREP 26ML APPLICATOR (WOUND CARE) ×3 IMPLANT
ELECT BLADE 4.0 EZ CLEAN MEGAD (MISCELLANEOUS) ×3
ELECT REM PT RETURN 9FT ADLT (ELECTROSURGICAL) ×3
ELECTRODE BLDE 4.0 EZ CLN MEGD (MISCELLANEOUS) ×1 IMPLANT
ELECTRODE REM PT RTRN 9FT ADLT (ELECTROSURGICAL) ×1 IMPLANT
GLOVE BIO SURGEON STRL SZ 6.5 (GLOVE) ×4 IMPLANT
GLOVE BIO SURGEONS STRL SZ 6.5 (GLOVE) ×2
GLOVE BIOGEL PI IND STRL 6.5 (GLOVE) ×1 IMPLANT
GLOVE BIOGEL PI IND STRL 7.0 (GLOVE) ×1 IMPLANT
GLOVE BIOGEL PI INDICATOR 6.5 (GLOVE) ×2
GLOVE BIOGEL PI INDICATOR 7.0 (GLOVE) ×2
GLOVE SKINSENSE NS SZ7.5 (GLOVE) ×2
GLOVE SKINSENSE STRL SZ7.5 (GLOVE) ×1 IMPLANT
GLOVE SURG SS PI 8.0 STRL IVOR (GLOVE) ×9 IMPLANT
GLOVE SURG SYN 7.5  E (GLOVE) ×2
GLOVE SURG SYN 7.5 E (GLOVE) ×1 IMPLANT
GOWN SRG XL XLNG 56XLVL 4 (GOWN DISPOSABLE) ×1 IMPLANT
GOWN STRL NON-REIN XL XLG LVL4 (GOWN DISPOSABLE) ×2
GOWN STRL REUS W/ TWL LRG LVL3 (GOWN DISPOSABLE) IMPLANT
GOWN STRL REUS W/TWL LRG LVL3 (GOWN DISPOSABLE)
HANDPIECE INTERPULSE COAX TIP (DISPOSABLE) ×2
HOOD PEEL AWAY FLYTE STAYCOOL (MISCELLANEOUS) ×9 IMPLANT
IV NS 1000ML (IV SOLUTION) ×2
IV NS 1000ML BAXH (IV SOLUTION) ×1 IMPLANT
IV NS IRRIG 3000ML ARTHROMATIC (IV SOLUTION) ×3 IMPLANT
KIT BASIN OR (CUSTOM PROCEDURE TRAY) ×3 IMPLANT
MARKER SKIN DUAL TIP RULER LAB (MISCELLANEOUS) ×3 IMPLANT
PACK TOTAL JOINT (CUSTOM PROCEDURE TRAY) ×3 IMPLANT
PACK UNIVERSAL I (CUSTOM PROCEDURE TRAY) ×3 IMPLANT
RTRCTR WOUND ALEXIS 18CM MED (MISCELLANEOUS) ×3
RTRCTR WOUND ALEXIS 18CM SML (INSTRUMENTS) ×3
SAVER CELL AAL HAEMONETICS (INSTRUMENTS) ×1 IMPLANT
SAW OSC TIP CART 19.5X105X1.3 (SAW) ×3 IMPLANT
SEALER BIPOLAR AQUA 6.0 (INSTRUMENTS) IMPLANT
SET HNDPC FAN SPRY TIP SCT (DISPOSABLE) ×1 IMPLANT
STAPLER VISISTAT 35W (STAPLE) ×3 IMPLANT
SUT ETHIBOND 2 V 37 (SUTURE) ×3 IMPLANT
SUT ETHIBOND NAB CT1 #1 30IN (SUTURE) ×9 IMPLANT
SUT VIC AB 1 CT1 27 (SUTURE) ×2
SUT VIC AB 1 CT1 27XBRD ANBCTR (SUTURE) ×1 IMPLANT
SUT VIC AB 2-0 CT1 27 (SUTURE) ×2
SUT VIC AB 2-0 CT1 TAPERPNT 27 (SUTURE) ×1 IMPLANT
TOWEL OR 17X26 10 PK STRL BLUE (TOWEL DISPOSABLE) ×3 IMPLANT

## 2015-05-30 NOTE — H&P (Signed)

## 2015-05-30 NOTE — Progress Notes (Signed)
Patient transferred back from PACU to the unit at 2000. Patient is sedated and in no acute distress. Dressing to left hip intact with a scant amount of old drainage. Will monitor closely overnight.

## 2015-05-30 NOTE — Progress Notes (Signed)
PROGRESS NOTE        PATIENT DETAILS Name: Alexa Hardin Age: 82 y.o. Sex: female Date of Birth: 1932-10-31 Admit Date: 05/27/2015 Admitting Physician Edwin Dada, MD MAY:[id number],FSELT F, MD Outpatient Specialists:Dr Burr Medico  Brief Narrative: Patient is a 82 y.o. female with hx of breast cancer, stage 4 Melanoma, advanced dementia being followed by hospice at home, presented to the ED after a mechanical fall at [facility] she was for respite care. Further evaluation revealed left hip fracture.   Subjective: Seems to be slightly more agitated than usual today. Not following commands.  Assessment/Plan: Principal Problem: Closed subcapital fracture of neck of left femur:Secondary to a mechanical fall-patient very minimally ambulatory with very advanced dementia-family contemplated non operative vs operative treatment,however after further d/w Ortho- Dr Erlinda Hong -plans are to pursue to operative treatment-tentatively scheduled for 5/11. Continue supportive care.  Active Problems: Dementia with behavioral disturbance:currently-pleasantly confused-occasionally follows commands. Continue supportive care with Seroquel,prn Lorazepam  Hypothyroidism:continue synthroid  Hypertension:uncontrolled today-continue- Atenolol and Nifedipine, add prn IV hydralazine-once oral intake resumed postsurgery-without further agents and optimize accordingly.  Hypokalemia:repleted  Hx of B/L Breast cancer/Stage 4 Melanoma:followed by Dr Ina Homes outpatient Oncology notes-ast CT scan on 03/07/2015 showed no evidence of disease.She is enrolled in hospice program in early 03/2015 due to her overall deconditioning and worsening fatigue and dementia   DVT Prophylaxis: SCD's  Code Status: DNR  Family Communication: NONE at bedside  Disposition Plan: Remain inpatient-suspect will require SNF with palliative/hospice follow up on discharge  Antimicrobial  agents: None  Procedures: None  CONSULTS:  orthopedic surgery  Time spent: 25- minutes-Greater than 50% of this time was spent in counseling, explanation of diagnosis, planning of further management, and coordination of care.  MEDICATIONS: Anti-infectives    None      Scheduled Meds: . atenolol  25 mg Oral QPM  . atenolol  50 mg Oral Daily  . docusate sodium  100 mg Oral BID  . feeding supplement (ENSURE ENLIVE)  237 mL Oral BID BM  . levothyroxine  137 mcg Oral QAC breakfast  . mirtazapine  15 mg Oral QHS  . NIFEdipine  30 mg Oral Daily  . potassium chloride  20 mEq Oral Daily  . QUEtiapine  25 mg Oral BID  . senna-docusate  2 tablet Oral BID   Continuous Infusions: . 0.9 % NaCl with KCl 40 mEq / L 40 mL/hr (05/29/15 1343)   PRN Meds:.bisacodyl, hydrALAZINE, HYDROcodone-acetaminophen, LORazepam, morphine injection, polyethylene glycol   PHYSICAL EXAM: Vital signs: Filed Vitals:   05/29/15 2108 05/30/15 0220 05/30/15 0624 05/30/15 1030  BP: 136/65 111/48 168/75 182/98  Pulse: 104 93 99 106  Temp: 98.3 F (36.8 C) 100.1 F (37.8 C) 97.5 F (36.4 C) 99.7 F (37.6 C)  TempSrc: Oral Axillary Oral Axillary  Resp: '18 16 18 20  '$ Weight:      SpO2: 96% 94% 96% 90%   Filed Weights   05/28/15 0433  Weight: 51.211 kg (112 lb 14.4 oz)   Body mass index is 16.66 kg/(m^2).   Gen Exam: Awake, pleasantly confused. Appears in mild distressAnd agitated this morning.  Neck: Supple, No JVD.   Chest: B/L Clear.   CVS: S1 S2 Regular Abdomen: soft, BS +, non tender, non distended.  Extremities: no edema, lower extremities warm to touch. Neurologic: Moves all 4 ext-but difficult Exam Skin: No Rash  or lesions   Wounds: N/A.    LABORATORY DATA: CBC:  Recent Labs Lab 05/27/15 2249 05/28/15 0835  WBC 8.5 9.6  NEUTROABS 7.1  --   HGB 11.6* 11.2*  HCT 36.7 36.7  MCV 81.0 81.0  PLT 327 093    Basic Metabolic Panel:  Recent Labs Lab 05/27/15 2249 05/28/15 0835   NA 143 145  K 3.0* 3.7  CL 102 107  CO2 27 27  GLUCOSE 100* 97  BUN 20 16  CREATININE 0.72 0.72  CALCIUM 9.3 8.9  MG  --  1.5*    GFR: Estimated Creatinine Clearance: 45.3 mL/min (by C-G formula based on Cr of 0.72).  Liver Function Tests: No results for input(s): AST, ALT, ALKPHOS, BILITOT, PROT, ALBUMIN in the last 168 hours. No results for input(s): LIPASE, AMYLASE in the last 168 hours. No results for input(s): AMMONIA in the last 168 hours.  Coagulation Profile:  Recent Labs Lab 05/27/15 2249  INR 1.04    Cardiac Enzymes: No results for input(s): CKTOTAL, CKMB, CKMBINDEX, TROPONINI in the last 168 hours.  BNP (last 3 results) No results for input(s): PROBNP in the last 8760 hours.  HbA1C: No results for input(s): HGBA1C in the last 72 hours.  CBG: No results for input(s): GLUCAP in the last 168 hours.  Lipid Profile: No results for input(s): CHOL, HDL, LDLCALC, TRIG, CHOLHDL, LDLDIRECT in the last 72 hours.  Thyroid Function Tests: No results for input(s): TSH, T4TOTAL, FREET4, T3FREE, THYROIDAB in the last 72 hours.  Anemia Panel:  Recent Labs  05/28/15 0835  FERRITIN 52    Urine analysis:    Component Value Date/Time   COLORURINE YELLOW 03/17/2015 [place] 03/17/2015 1644   LABSPEC 1.010 03/17/2015 1644   PHURINE 7.0 03/17/2015 1644   GLUCOSEU NEGATIVE 03/17/2015 1644   HGBUR NEGATIVE 03/17/2015 1644   BILIRUBINUR NEGATIVE 03/17/2015 [place] 03/17/2015 1644   PROTEINUR NEGATIVE 03/17/2015 1644   NITRITE NEGATIVE 03/17/2015 1644   LEUKOCYTESUR NEGATIVE 03/17/2015 1644    Sepsis Labs: Lactic Acid, Venous No results found for: LATICACIDVEN  MICROBIOLOGY: Recent Results (from the past 240 hour(s))  Surgical pcr screen     Status: None   Collection Time: 05/30/15  6:16 AM  Result Value Ref Range Status   MRSA, PCR NEGATIVE NEGATIVE Final   Staphylococcus aureus NEGATIVE NEGATIVE Final    Comment:         The Xpert SA Assay (FDA approved for NASAL specimens in patients over 68 years of age), is one component of a comprehensive surveillance program.  Test performance has been validated by [facility] for patients greater than or equal to 53 year old. It is not intended to diagnose infection nor to guide or monitor treatment.     RADIOLOGY STUDIES/RESULTS: Dg Chest 1 View  05/27/2015  CLINICAL DATA:  Left hip fracture. Malignant melanoma in bilateral breast carcinoma. Pre-op respiratory exam EXAM: CHEST 1 VIEW COMPARISON:  03/17/2015 FINDINGS: Right-sided Port-A-Cath remains in appropriate position. Mild cardiomegaly is stable. Pulmonary hyperinflation is seen, consistent with COPD. No evidence of acute infiltrate or pleural effusion. IMPRESSION: Stable COPD and mild cardiomegaly.  No acute findings. Electronically Signed   By: Earle Gell M.D.   On: 05/27/2015 22:48   Ct Head Wo Contrast  05/28/2015  CLINICAL DATA:  Initial valuation for acute trauma, fall. EXAM: CT HEAD WITHOUT CONTRAST TECHNIQUE: Contiguous axial images were obtained from the base of the skull through the  vertex without intravenous contrast. COMPARISON:  Prior study from 03/17/2015. FINDINGS: Diffuse prominence of the CSF containing spaces is compatible with generalized age-related cerebral atrophy. Patchy hypodensity within the periventricular and deep white matter most compatible with chronic small vessel ischemic disease. Vascular calcifications within the carotid siphons. Overall, these changes are stable. No acute intracranial hemorrhage. No acute large vessel territory infarct. No mass lesion, midline shift, or mass effect. Ventricular prominence related to global parenchymal volume loss present, stable. No hydrocephalus. No extra-axial fluid collection. Scalp soft tissues demonstrate no acute abnormality. No acute abnormality about the orbits. Paranasal sinuses are clear.  No mastoid effusion. Calvarium intact.  IMPRESSION: 1. No acute intracranial process. 2. Moderate diffuse atrophy with chronic small vessel ischemic disease, stable. Electronically Signed   By: Jeannine Boga M.D.   On: 05/28/2015 01:25   Dg Hip Unilat With Pelvis 2-3 Views Left  05/27/2015  CLINICAL DATA:  Left hip injury and pain after jumping out of wheelchair. Initial encounter. EXAM: DG HIP (WITH OR WITHOUT PELVIS) 2-3V LEFT COMPARISON:  None. FINDINGS: A subcapital left femoral neck fracture is seen. No evidence of dislocation. Generalized osteopenia noted. No pelvic fracture identified. Bipolar right hip prosthesis noted as well as generalized osteopenia. IMPRESSION: Subcapital left femoral neck fracture. Diffuse osteopenia. Electronically Signed   By: Earle Gell M.D.   On: 05/27/2015 22:46     LOS: 2 days   Oren Binet, MD  [facility] Pager:[phone]  If 7PM-7AM, please contact night-coverage [email or web address] Password [email or web address] 05/30/2015, 1:36 PM

## 2015-05-30 NOTE — Anesthesia Preprocedure Evaluation (Addendum)
Anesthesia Evaluation  Patient identified by MRN, date of birth, ID band Patient awake    Reviewed: Allergy & Precautions, NPO status , Patient's Chart, lab work & pertinent test results  Airway Mallampati: II  TM Distance: >3 FB Neck ROM: Full    Dental no notable dental hx.    Pulmonary pneumonia, COPD, former smoker,  CXR: Stable copd, mild cardiomegaly   Pulmonary exam normal breath sounds clear to auscultation       Cardiovascular Exercise Tolerance: Poor hypertension, Pt. on medications and Pt. on home beta blockers Normal cardiovascular exam Rhythm:Regular Rate:Normal     Neuro/Psych PSYCHIATRIC DISORDERS negative neurological ROS     GI/Hepatic negative GI ROS, Neg liver ROS,   Endo/Other  Hypothyroidism   Renal/GU Renal disease  negative genitourinary   Musculoskeletal negative musculoskeletal ROS (+)   Abdominal   Peds negative pediatric ROS (+)  Hematology negative hematology ROS (+)   Anesthesia Other Findings   Reproductive/Obstetrics negative OB ROS                           Anesthesia Physical Anesthesia Plan  ASA: III  Anesthesia Plan: General   Post-op Pain Management:    Induction: Intravenous  Airway Management Planned: Oral ETT  Additional Equipment:   Intra-op Plan:   Post-operative Plan: Extubation in OR  Informed Consent: I have reviewed the patients History and Physical, chart, labs and discussed the procedure including the risks, benefits and alternatives for the proposed anesthesia with the patient or authorized representative who has indicated his/her understanding and acceptance.   Dental advisory given  Plan Discussed with: CRNA  Anesthesia Plan Comments: (Discussed spinal and general with patient's husband and daughter. Plan general.)      Anesthesia Quick Evaluation

## 2015-05-30 NOTE — Progress Notes (Signed)
Patient arrived to short stay making incoherent verbal sounds. Family at bedsidfe states that she was in pain and had not received pain medication while in her room this morning. Order received from Dr Eliseo Squires for Fentanyl 62mg. Given IV through peripheral IV left arm. Face mask O2 applied per physician request. Patient responded by displaying a decrease in vocalizations and began sleeping.

## 2015-05-30 NOTE — Anesthesia Procedure Notes (Signed)
Procedure Name: Intubation Date/Time: 05/30/2015 4:17 PM Performed by: Melina Copa, Tong Pieczynski R Pre-anesthesia Checklist: Patient identified, Emergency Drugs available, Suction available and Patient being monitored Patient Re-evaluated:Patient Re-evaluated prior to inductionOxygen Delivery Method: Circle System Utilized Preoxygenation: Pre-oxygenation with 100% oxygen Intubation Type: IV induction Ventilation: Mask ventilation without difficulty Laryngoscope Size: Mac and 3 Grade View: Grade I Tube type: Oral Tube size: 7.5 mm Number of attempts: 2 Airway Equipment and Method: Stylet and Oral airway Placement Confirmation: ETT inserted through vocal cords under direct vision,  positive ETCO2 and breath sounds checked- equal and bilateral Secured at: 20 cm Tube secured with: Tape Dental Injury: Teeth and Oropharynx as per pre-operative assessment

## 2015-05-30 NOTE — Op Note (Signed)
ANTERIOR APPROACH LEFT HIP HEMIARTHROPLASTY  Procedure Note Shakerra Red   [id number]  Pre-op Diagnosis: left hip femoral neck fracture     Post-op Diagnosis: same   Operative Procedures  1. Prosthetic replacement for femoral neck fracture. CPT [phone]  Personnel  Surgeon(s): Naiping Ephriam Jenkins, MD   Anesthesia: general  Prosthesis: Depuy Femur: Corail KA 14 Head: 76m size: +1 Bearing Type: Bipolar  Date of Service: 05/27/2015 - 05/30/2015  Hip Hemiarthroplasty (Anterior Approach) Op Note:  After informed consent was obtained and the operative extremity marked in the holding area, the patient was brought back to the operating room and placed supine on the HANA table. Next, the operative extremity was prepped and draped in normal sterile fashion. Surgical timeout occurred verifying patient identification, surgical site, surgical procedure and administration of antibiotics.  A modified anterior Smith-Peterson approach to the hip was performed, using the interval between tensor fascia lata and sartorius.  Dissection was carried bluntly down onto the anterior hip capsule. The lateral femoral circumflex vessels were identified and coagulated. A capsulotomy was performed and the capsular flaps tagged for later repair.  Fluoroscopy was utilized to prepare for the femoral neck cut. The neck osteotomy was performed. The femoral head was removed and found a 48 mm head was the appropriate fit.    We then turned our attention to the femur.  After placing the femoral hook, the leg was taken to externally rotated, extended and adducted position taking care to perform soft tissue releases to allow for adequate mobilization of the femur. Soft tissue was cleared from the shoulder of the greater trochanter and the hook elevator used to improve exposure of the proximal femur. Sequential broaching performed up to a size 14. Trial neck and head were placed. The leg was brought back up to neutral and the construct  reduced. The position and sizing of components, offset and leg lengths were checked using fluoroscopy. Stability of the construct was checked in extension and external rotation without any subluxation or impingement of prosthesis. We dislocated the prosthesis, dropped the leg back into position, removed trial components, and irrigated copiously. The final stem and head was then placed, the leg brought back up, the system reduced and fluoroscopy used to verify positioning.  We irrigated, obtained hemostasis and closed the capsule using #2 ethibond suture.  The fascia was closed with #1 vicryl plus, the deep fat layer was closed with 0 vicryl, the subcutaneous layers closed with 2.0 Vicryl Plus and the skin closed with staples. A sterile dressing was applied. The patient was awakened in the operating room and taken to recovery in stable condition. All sponge, needle, and instrument counts were correct at the end of the case.   Position: supine  Complications: none.  Time Out: performed   Drains/Packing: none  Estimated blood loss: 50 cc  Returned to Recovery Room: in good condition.   Antibiotics: yes   Mechanical VTE (DVT) Prophylaxis: sequential compression devices, TED thigh-high  Chemical VTE (DVT) Prophylaxis: lovenox  Fluid Replacement: Crystalloid: see anesthesia record  Specimens Removed: 1 to pathology   Sponge and Instrument Count Correct? yes   PACU: portable radiograph - low AP   Admission: inpatient status, start PT & OT POD#1  Plan/RTC: Return in 2 weeks for staple removal. Return in 6 weeks to see MD.  Weight Bearing/Load Lower Extremity: full  Hip precautions: none Suture Removal: 10-14 days  Betadine to incision twice daily once dressing is removed on POD#7  N. MEduard Roux  MD [place] 5:31 PM     Implant Name Type Inv. Item Serial No. Manufacturer Lot No. LRB No. Used  HIP BALL ARTICU DEPUY - [id number] Hips HIP BALL ARTICU DEPUY   DEPUY [id number] Left 1  BIPOLAR PROS AML 45MM - [id number] Hips BIPOLAR PROS AML 45MM  DEPUY [id number] Left 1  STEM CORAIL KA14 - [id number] Stem STEM CORAIL KA14   DEPUY [id number] Left 1

## 2015-05-30 NOTE — Anesthesia Postprocedure Evaluation (Signed)
Anesthesia Post Note  Patient: Alexa Hardin  Procedure(s) Performed: Procedure(s) (LRB): ANTERIOR APPROACH LEFT HIP HEMIARTHROPLASTY (Left)  Patient location during evaluation: PACU Anesthesia Type: General Level of consciousness: awake and alert Pain management: pain level controlled Vital Signs Assessment: post-procedure vital signs reviewed and stable Respiratory status: spontaneous breathing, nonlabored ventilation, respiratory function stable and patient connected to nasal cannula oxygen Cardiovascular status: blood pressure returned to baseline and stable Postop Assessment: no signs of nausea or vomiting Anesthetic complications: no    Last Vitals:  Filed Vitals:   05/30/15 1940 05/30/15 1951  BP: 154/86 138/66  Pulse: 98 82  Temp:  36.5 C  Resp: 20 14    Last Pain:  Filed Vitals:   05/30/15 1955  PainSc: Asleep                 Charlee Whitebread J

## 2015-05-30 NOTE — Transfer of Care (Signed)
Immediate Anesthesia Transfer of Care Note  Patient: Alexa Hardin  Procedure(s) Performed: Procedure(s): ANTERIOR APPROACH LEFT HIP HEMIARTHROPLASTY (Left)  Patient Location: PACU  Anesthesia Type:General  Level of Consciousness: awake and patient cooperative  Airway & Oxygen Therapy: Patient Spontanous Breathing and Patient connected to nasal cannula oxygen  Post-op Assessment: Report given to RN, Post -op Vital signs reviewed and stable and Patient moving all extremities  Post vital signs: Reviewed and stable  Last Vitals:  Filed Vitals:   05/30/15 1405 05/30/15 1524  BP: 180/89   Pulse: 106 96  Temp: 36.9 C   Resp: 20     Last Pain:  Filed Vitals:   05/30/15 1710  PainSc: Asleep         Complications: No apparent anesthesia complications

## 2015-05-30 NOTE — Progress Notes (Signed)
[facility] RN Visit  This is a related, covered GIP admission from 05/28/15 to [facility] diagnosis of Alzheimer's Disease, per MD [place]. Code status is DNR. She was admitted status post fall and sustained left hip fracture. Patient seen in room, resting comfortably in bed. She is easily arouseable. She is unable to participate in meaningful conversation and speech is garbled. She is on RA with O2 sat at 100% and respirations at 20. Patient has received 3 doses of 5/325 mg Vicodin tablets po for pain, and 3 doses of 0.5 mg Ativan for anxiety in the past 24 hours. Patient has F/C present with minimal clear, yellow drainage noted.  Patient remains NPO for surgery today. No family present at time of visit. [facility] will continue to follow daily.  Please call w/ any hospice-related questions or concerns.   Freddi Starr RN, [facility] Liaison [phone]

## 2015-05-31 ENCOUNTER — Inpatient Hospital Stay (HOSPITAL_COMMUNITY)

## 2015-05-31 ENCOUNTER — Encounter (HOSPITAL_COMMUNITY): Payer: Self-pay | Admitting: Orthopaedic Surgery

## 2015-05-31 LAB — URINALYSIS, ROUTINE W REFLEX MICROSCOPIC
Bilirubin Urine: NEGATIVE
GLUCOSE, UA: NEGATIVE mg/dL
KETONES UR: 15 mg/dL — AB
Leukocytes, UA: NEGATIVE
Nitrite: NEGATIVE
PH: 5.5 (ref 5.0–8.0)
PROTEIN: 30 mg/dL — AB
Specific Gravity, Urine: 1.028 (ref 1.005–1.030)

## 2015-05-31 LAB — CBC
HEMATOCRIT: 31.5 % — AB (ref 36.0–46.0)
Hemoglobin: 9.5 g/dL — ABNORMAL LOW (ref 12.0–15.0)
MCH: 24.2 pg — AB (ref 26.0–34.0)
MCHC: 30.2 g/dL (ref 30.0–36.0)
MCV: 80.2 fL (ref 78.0–100.0)
PLATELETS: 218 10*3/uL (ref 150–400)
RBC: 3.93 MIL/uL (ref 3.87–5.11)
RDW: 14.5 % (ref 11.5–15.5)
WBC: 7.6 10*3/uL (ref 4.0–10.5)

## 2015-05-31 LAB — URINE MICROSCOPIC-ADD ON

## 2015-05-31 LAB — BASIC METABOLIC PANEL
Anion gap: 11 (ref 5–15)
BUN: 12 mg/dL (ref 6–20)
CALCIUM: 7.9 mg/dL — AB (ref 8.9–10.3)
CO2: 24 mmol/L (ref 22–32)
Chloride: 105 mmol/L (ref 101–111)
Creatinine, Ser: 0.68 mg/dL (ref 0.44–1.00)
GFR calc Af Amer: >60 mL/min (ref >60)
GLUCOSE: 107 mg/dL — AB (ref 65–99)
Potassium: 4 mmol/L (ref 3.5–5.1)
Sodium: 140 mmol/L (ref 135–145)

## 2015-05-31 MED ORDER — OXYCODONE HCL 5 MG PO TABS: 5.0000 mg | ORAL_TABLET | ORAL | Status: DC | PRN

## 2015-05-31 MED ORDER — ACETAMINOPHEN 10 MG/ML IV SOLN
1000.0000 mg | Freq: Four times a day (QID) | INTRAVENOUS | Status: DC
  Administered 2015-06-01 (×2): 1000 mg via INTRAVENOUS
  Filled 2015-05-31 (×5): qty 100

## 2015-05-31 MED ORDER — ACETAMINOPHEN 500 MG PO TABS
1000.0000 mg | ORAL_TABLET | Freq: Three times a day (TID) | ORAL | Status: DC
  Administered 2015-05-31 – 2015-06-04 (×11): 1000 mg via ORAL
  Filled 2015-05-31 (×12): qty 2

## 2015-05-31 NOTE — Progress Notes (Signed)
Bed alarm activated

## 2015-05-31 NOTE — Progress Notes (Signed)
[facility] RN Visit  This is a related, covered GIP admission from 05/28/15 to [facility] diagnosis of Alzheimer's Disease, per MD [place]od. Code status is DNR. She was admitted status post fall and sustained left hip fracture. Patient seen in room, sitting up in bed. She is speaking in incomprehensible speech and pulling at her blanket. She is on RA with O2 sat at 99% and respirations at 24. Patient has received 1 dose of 5 mg Oxycodone IR po and 0.5 mg Morphine IV for pain, per chart review. Patient has F/C present with minimal clear, yellow drainage noted. Patient has not eaten breakfast. Yesterday evening, patient had an anterior approach Lt hip hemiarthroplasty. She has a surgical incision with staples noted to Lt hip OTA. No family present at time of visit. [facility] will continue to follow daily.  Please call w/ any hospice-related questions or concerns.   Freddi Starr RN, [facility] Liaison [phone]

## 2015-05-31 NOTE — Evaluation (Signed)
Physical Therapy Evaluation Patient Details Name: Alexa Hardin MRN: [id number] DOB: Jan 15, 1933 Today's Date: 05/31/2015   History of Present Illness  Patient is a 82 y.o. female with hx of breast cancer, stage 4 Melanoma, advanced dementia being followed by hospice at home, presented to the ED after a mechanical fall at [facility] she was for respite care.Presented with L hip fracture, now s/p left hip hemiarthroplasty on 5/11.  Clinical Impression  Patient presents with decreased mobility due to deficits listed in PT problem list.  She may benefit from skilled PT in the acute setting to allow maximized mobility prior to d/c home versus SNF depending on family preference.  Feel she may benefit from SNF level rehab, but could also benefit from HHPT if more cognitively stable in a familiar environment.  This session unable to focus to follow commands to assist much with mobility.  Did benefit in that was calmer in bed after session after mobilizing to EOB.  Will follow up.    Follow Up Recommendations Home health PT;SNF;No PT follow up (depending on family decision)    Equipment Recommendations  None recommended by PT    Recommendations for Other Services       Precautions / Restrictions Precautions Precautions: Fall Restrictions LLE Weight Bearing: Weight bearing as tolerated      Mobility  Bed Mobility Overal bed mobility: Needs Assistance Bed Mobility: Supine to Sit;Sit to Supine     Supine to sit: Total assist Sit to supine: Total assist   General bed mobility comments: asist for legs and trunk to upright and back to bed; scooted up to [facility] with pad under pt.  Transfers                 General transfer comment: unable with 1 person assist  Ambulation/Gait                Stairs            Wheelchair Mobility    Modified Rankin (Stroke Patients Only)       Balance Overall balance assessment: Needs assistance   Sitting balance-Leahy Scale:  Poor Sitting balance - Comments: leaning to R; at least mod support for balance; sat EOB about 5 minutes                                     Pertinent Vitals/Pain Pain Assessment: Faces Faces Pain Scale: Hurts even more Pain Location: legs with movement Pain Descriptors / Indicators: Grimacing;Guarding Pain Intervention(s): Repositioned;Monitored during session;Limited activity within patient's tolerance    Home Living Family/patient expects to be discharged to:: Private residence (but was at [facility] for respite care when she fell) Living Arrangements: Spouse/significant other Available Help at Discharge: Family;Available 24 hours/day Type of Home: House Home Access: Level entry     Home Layout: Two level Home Equipment: Wheelchair - manual;Bedside commode      Prior Function Level of Independence: Needs assistance   Gait / Transfers Assistance Needed: ambulates sometimes wtih assist at home and sometimes supervision           Hand Dominance        Extremity/Trunk Assessment   Upper Extremity Assessment: Difficult to assess due to impaired cognition           Lower Extremity Assessment: Difficult to assess due to impaired cognition      Cervical / Trunk Assessment: Kyphotic;Other exceptions  Communication  Communication: Expressive difficulties (due to demential, echolalia)  Cognition Arousal/Alertness: Awake/alert Behavior During Therapy: Agitated Overall Cognitive Status: Impaired/Different from baseline (spouse reports mumbling/calling out incoherently is different since falling)                      General Comments General comments (skin integrity, edema, etc.): spouse in room and attempting to reassure pt; pt calms at times when you call her name and will respond and look at you, but mostly eyes closed and calling out same incoherent word over and over    Exercises General Exercises - Lower Extremity Heel Slides: AAROM;Left;10  reps;Supine      Assessment/Plan    PT Assessment Patient needs continued PT services  PT Diagnosis Acute pain;Difficulty walking   PT Problem List Decreased activity tolerance;Decreased balance;Decreased mobility;Decreased strength;Pain  PT Treatment Interventions DME instruction;Gait training;Functional mobility training;Patient/family education;Therapeutic activities;Therapeutic exercise;Balance training   PT Goals (Current goals can be found in the Care Plan section) Acute Rehab PT Goals Patient Stated Goal: spouse not sure yet about taking pt home or going to SNF PT Goal Formulation: With family Time For Goal Achievement: 06/07/15 Potential to Achieve Goals: Poor    Frequency Min 3X/week   Barriers to discharge        Co-evaluation               End of Session   Activity Tolerance: Patient limited by fatigue;Treatment limited secondary to agitation Patient left: with bed alarm set;in bed Nurse Communication: Other (comment) (pt felt hot; to check temp)         Time: 6659-9357 PT Time Calculation (min) (ACUTE ONLY): 23 min   Charges:   PT Evaluation $PT Eval Moderate Complexity: 1 Procedure PT Treatments $Therapeutic Activity: 8-22 mins   PT G CodesReginia Hardin 06-30-15, 5:33 PM  Alexa Hardin, [place] Jun 30, 2015

## 2015-05-31 NOTE — Progress Notes (Signed)
[facility]C-20[facility]-Chaplain Visit  This is a related, covered GIP admission. Pt lying in bed sleeping throughout visit. At visit conclusion, chaplain greeted pt and she opened her eyes briefly and mumbled, quickly fell back to sleep. Pt's spouse at bedside. Spouse discusses spiritual coping with pt's fall, hospital admission, and surgery. Spouse's primary concern is the "uncertainty" of pt's future and future care. Spouse discusses self care. Spouse reports he is sleeping and eating well at home. [facility] MSW, Marilynne Halsted, arrived during visit. Chaplain provided spiritual presence with empathic listening, reassurance, and affirmation of faith. Chaplain provided spiritual counseling to support spouse's spiritual coping and healthy self-care. Chaplain facilitated prayer with pt and spouse.   Please call [facility] with questions/concerns.   Peggye Form, [facility] [place]

## 2015-05-31 NOTE — Progress Notes (Signed)
OT Cancellation Note  Patient Details Name: Alexa Hardin MRN: [id number] DOB: December 09, 1932   Cancelled Treatment:    Reason Eval/Treat Not Completed: OT screened, no needs identified, will sign off.  Reviewed chart.  Pt was dependent with all adls at baseline. Will sign off.  [name] 05/31/2015, 9:39 AM  Lesle Chris, OTR/L [phone] 05/31/2015

## 2015-05-31 NOTE — Progress Notes (Signed)
PROGRESS NOTE        PATIENT DETAILS Name: Alexa Hardin Age: 82 y.o. Sex: female Date of Birth: 1932/02/17 Admit Date: 05/27/2015 Admitting Physician Edwin Dada, MD OJJ:[id number],XHBZJ F, MD Outpatient Specialists:Dr Burr Medico  Brief Narrative: Patient is a 82 y.o. female with hx of breast cancer, stage 4 Melanoma, advanced dementia being followed by hospice at home, presented to the ED after a mechanical fall at [facility] she was for respite care. Further evaluation revealed left hip fracture, after d/w family, she underwent left hip repair on 5/11.  Subjective: Confused-slightly agitated. Febrile.  Assessment/Plan: Principal Problem: Closed subcapital fracture of neck of left femur:Secondary to a mechanical fall-patient very minimally ambulatory with very advanced dementia-family contemplated non operative vs operative treatment,however after further d/w Ortho- Dr Erlinda Hong -undwernt left hip repair on 5/11. Febrile post operatively-UA/CXR negative for infection, could have aspiration/atelectasis-will obtain blood cultures-if fever continues then will start empiric Zosyn  Active Problems: Dementia with behavioral disturbance:currently-pleasantly confused-occasionally follows commands. Continue supportive care with Seroquel,prn Lorazepam  Hypothyroidism:continue synthroid  Hypertension:moderate control today-continue- Atenolol and Nifedipine, and prn IV hydralazine  Hypokalemia:repleted  Hx of B/L Breast cancer/Stage 4 Melanoma:followed by Dr Ina Homes outpatient Oncology notes-ast CT scan on 03/07/2015 showed no evidence of disease.She is enrolled in hospice program in early 03/2015 due to her overall deconditioning and worsening fatigue and dementia   DVT Prophylaxis: SCD's  Code Status: DNR  Family Communication: NONE at bedside  Disposition Plan: Remain inpatient-suspect will require SNF with palliative/hospice follow up on  discharge  Antimicrobial agents: None  Procedures: None  CONSULTS:  orthopedic surgery  Time spent: 25- minutes-Greater than 50% of this time was spent in counseling, explanation of diagnosis, planning of further management, and coordination of care.  MEDICATIONS: Anti-infectives    Start     Dose/Rate Route Frequency Ordered Stop   05/30/15 2200  ceFAZolin (ANCEF) IVPB 2g/100 mL premix     2 g 200 mL/hr over 30 Minutes Intravenous Every 6 hours 05/30/15 2018 05/31/15 0819      Scheduled Meds: . acetaminophen  1,000 mg Oral TID  . atenolol  25 mg Oral QPM  . atenolol  50 mg Oral Daily  . docusate sodium  100 mg Oral BID  . enoxaparin (LOVENOX) injection  40 mg Subcutaneous Q24H  . feeding supplement (ENSURE ENLIVE)  237 mL Oral BID BM  . levothyroxine  137 mcg Oral QAC breakfast  . mirtazapine  15 mg Oral QHS  . NIFEdipine  30 mg Oral Daily  . potassium chloride  20 mEq Oral Daily  . QUEtiapine  25 mg Oral BID  . senna-docusate  2 tablet Oral BID  . tranexamic acid (CYKLOKAPRON) topical -INTRAOP  2,000 mg Topical Once   Continuous Infusions: . sodium chloride 125 mL/hr at 05/31/15 0406  . 0.9 % NaCl with KCl 40 mEq / L 40 mL/hr (05/29/15 1343)  . lactated ringers     PRN Meds:.alum & mag hydroxide-simeth, bisacodyl, hydrALAZINE, LORazepam, menthol-cetylpyridinium **OR** phenol, methocarbamol **OR** methocarbamol (ROBAXIN)  IV, metoCLOPramide **OR** metoCLOPramide (REGLAN) injection, morphine injection, morphine injection, ondansetron **OR** ondansetron (ZOFRAN) IV, oxyCODONE, oxyCODONE, polyethylene glycol   PHYSICAL EXAM: Vital signs: Filed Vitals:   05/30/15 2100 05/31/15 0108 05/31/15 0523 05/31/15 1049  BP: 115/57 132/65 106/69 164/83  Pulse: 88 86 87   Temp: 98 F (36.7 C) 100.8 F (38.2  C) 101.1 F (38.4 C) 99.6 F (37.6 C)  TempSrc: Axillary Axillary Axillary Axillary  Resp: '20 21 24   '$ Weight:      SpO2: 100% 100% 99%    Filed Weights   05/28/15  0433  Weight: 51.211 kg (112 lb 14.4 oz)   Body mass index is 16.66 kg/(m^2).   Gen Exam: Awake, pleasantly confused. Slightly agitated this am Neck: Supple, No JVD.   Chest: B/L Clear.   CVS: S1 S2 Regular Abdomen: soft, BS +, non tender, non distended.  Extremities: no edema, lower extremities warm to touch. Neurologic: Moves all 4 ext-but difficult Exam Skin: No Rash or lesions   Wounds: N/A.    LABORATORY DATA: CBC:  Recent Labs Lab 05/27/15 2249 05/28/15 0835 05/30/15 2112 05/31/15 0409  WBC 8.5 9.6 8.9 7.6  NEUTROABS 7.1  --   --   --   HGB 11.6* 11.2* 10.3* 9.5*  HCT 36.7 36.7 33.1* 31.5*  MCV 81.0 81.0 80.0 80.2  PLT 327 293 223 889    Basic Metabolic Panel:  Recent Labs Lab 05/27/15 2249 05/28/15 0835 05/30/15 2112 05/31/15 0409  NA 143 145  --  140  K 3.0* 3.7  --  4.0  CL 102 107  --  105  CO2 27 27  --  24  GLUCOSE 100* 97  --  107*  BUN 20 16  --  12  CREATININE 0.72 0.72 0.76 0.68  CALCIUM 9.3 8.9  --  7.9*  MG  --  1.5*  --   --     GFR: Estimated Creatinine Clearance: 45.3 mL/min (by C-G formula based on Cr of 0.68).  Liver Function Tests: No results for input(s): AST, ALT, ALKPHOS, BILITOT, PROT, ALBUMIN in the last 168 hours. No results for input(s): LIPASE, AMYLASE in the last 168 hours. No results for input(s): AMMONIA in the last 168 hours.  Coagulation Profile:  Recent Labs Lab 05/27/15 2249  INR 1.04    Cardiac Enzymes: No results for input(s): CKTOTAL, CKMB, CKMBINDEX, TROPONINI in the last 168 hours.  BNP (last 3 results) No results for input(s): PROBNP in the last 8760 hours.  HbA1C: No results for input(s): HGBA1C in the last 72 hours.  CBG: No results for input(s): GLUCAP in the last 168 hours.  Lipid Profile: No results for input(s): CHOL, HDL, LDLCALC, TRIG, CHOLHDL, LDLDIRECT in the last 72 hours.  Thyroid Function Tests: No results for input(s): TSH, T4TOTAL, FREET4, T3FREE, THYROIDAB in the last 72  hours.  Anemia Panel: No results for input(s): VITAMINB12, FOLATE, FERRITIN, TIBC, IRON, RETICCTPCT in the last 72 hours.  Urine analysis:    Component Value Date/Time   COLORURINE YELLOW 05/31/2015 0755   APPEARANCEUR HAZY* 05/31/2015 0755   LABSPEC 1.028 05/31/2015 0755   PHURINE 5.5 05/31/2015 0755   GLUCOSEU NEGATIVE 05/31/2015 0755   HGBUR SMALL* 05/31/2015 0755   BILIRUBINUR NEGATIVE 05/31/2015 0755   KETONESUR 15* 05/31/2015 0755   PROTEINUR 30* 05/31/2015 0755   NITRITE NEGATIVE 05/31/2015 0755   LEUKOCYTESUR NEGATIVE 05/31/2015 0755    Sepsis Labs: Lactic Acid, Venous No results found for: LATICACIDVEN  MICROBIOLOGY: Recent Results (from the past 240 hour(s))  Surgical pcr screen     Status: None   Collection Time: 05/30/15  6:16 AM  Result Value Ref Range Status   MRSA, PCR NEGATIVE NEGATIVE Final   Staphylococcus aureus NEGATIVE NEGATIVE Final    Comment:        The Xpert SA Assay (  FDA approved for NASAL specimens in patients over 71 years of age), is one component of a comprehensive surveillance program.  Test performance has been validated by [facility] for patients greater than or equal to 22 year old. It is not intended to diagnose infection nor to guide or monitor treatment.     RADIOLOGY STUDIES/RESULTS: Dg Chest 1 View  05/27/2015  CLINICAL DATA:  Left hip fracture. Malignant melanoma in bilateral breast carcinoma. Pre-op respiratory exam EXAM: CHEST 1 VIEW COMPARISON:  03/17/2015 FINDINGS: Right-sided Port-A-Cath remains in appropriate position. Mild cardiomegaly is stable. Pulmonary hyperinflation is seen, consistent with COPD. No evidence of acute infiltrate or pleural effusion. IMPRESSION: Stable COPD and mild cardiomegaly.  No acute findings. Electronically Signed   By: Earle Gell M.D.   On: 05/27/2015 22:48   Ct Head Wo Contrast  05/28/2015  CLINICAL DATA:  Initial valuation for acute trauma, fall. EXAM: CT HEAD WITHOUT CONTRAST TECHNIQUE:  Contiguous axial images were obtained from the base of the skull through the vertex without intravenous contrast. COMPARISON:  Prior study from 03/17/2015. FINDINGS: Diffuse prominence of the CSF containing spaces is compatible with generalized age-related cerebral atrophy. Patchy hypodensity within the periventricular and deep white matter most compatible with chronic small vessel ischemic disease. Vascular calcifications within the carotid siphons. Overall, these changes are stable. No acute intracranial hemorrhage. No acute large vessel territory infarct. No mass lesion, midline shift, or mass effect. Ventricular prominence related to global parenchymal volume loss present, stable. No hydrocephalus. No extra-axial fluid collection. Scalp soft tissues demonstrate no acute abnormality. No acute abnormality about the orbits. Paranasal sinuses are clear.  No mastoid effusion. Calvarium intact. IMPRESSION: 1. No acute intracranial process. 2. Moderate diffuse atrophy with chronic small vessel ischemic disease, stable. Electronically Signed   By: Jeannine Boga M.D.   On: 05/28/2015 01:25   Pelvis Portable  05/30/2015  CLINICAL DATA:  Postop from left hip arthroplasty. Left femoral neck fracture. EXAM: PORTABLE PELVIS 1-2 VIEWS COMPARISON:  05/27/2015 FINDINGS: A left hip prosthesis is now seen in expected position with overlying skin staples. There is no evidence of fracture or dislocation. Previously demonstrated right hip prosthesis is unchanged. Generalized osteopenia noted. IMPRESSION: Expected postoperative appearance of left hip prosthesis. No complication identified. Electronically Signed   By: Earle Gell M.D.   On: 05/30/2015 19:51   Dg Chest Port 1 View  05/31/2015  CLINICAL DATA:  Shortness of breath EXAM: PORTABLE CHEST 1 VIEW COMPARISON:  05/27/2015 FINDINGS: Chronic hyperinflation. Postoperative changes on the left with volume loss and scarring. Stable right apical thickening that is scar  based on recent chest CT. Enlarged appearance of the heart, accentuated by postoperative distortion. Stable cardiomediastinal contours. IMPRESSION: 1. Stable.  No evidence of acute disease. 2. COPD and postoperative changes. Electronically Signed   By: Monte Fantasia M.D.   On: 05/31/2015 08:20   Dg Hip Operative Unilat W Or W/o Pelvis Left  05/30/2015  CLINICAL DATA:  Status post total hip replacement EXAM: OPERATIVE LEFT HIP   1 VIEW TECHNIQUE: Fluoroscopic spot image(s) were submitted for interpretation post-operatively. COMPARISON:  May a 2017 FLUOROSCOPY TIME:  0 minutes 22 seconds; 3 acquired images FINDINGS: Frontal view obtained. There is a total hip replacement on the left with prosthetic component well-seated on this single view. No fracture or dislocation evident. Total hip replacement also noted on the right. IMPRESSION: Left total hip prosthesis well-seated on frontal view. No demonstrable fracture or dislocation. Electronically Signed   By: Gwyndolyn Saxon  Jasmine December III M.D.   On: 05/30/2015 18:35   Dg Hip Unilat With Pelvis 2-3 Views Left  05/27/2015  CLINICAL DATA:  Left hip injury and pain after jumping out of wheelchair. Initial encounter. EXAM: DG HIP (WITH OR WITHOUT PELVIS) 2-3V LEFT COMPARISON:  None. FINDINGS: A subcapital left femoral neck fracture is seen. No evidence of dislocation. Generalized osteopenia noted. No pelvic fracture identified. Bipolar right hip prosthesis noted as well as generalized osteopenia. IMPRESSION: Subcapital left femoral neck fracture. Diffuse osteopenia. Electronically Signed   By: Earle Gell M.D.   On: 05/27/2015 22:46     LOS: 3 days   Oren Binet, MD  [facility] Pager:[phone]  If 7PM-7AM, please contact night-coverage [email or web address] Password [facility] 05/31/2015, 11:51 AM

## 2015-05-31 NOTE — Progress Notes (Signed)
[facility] MSW note: This is a hospice related admission. Met with spouse-Ralph who was sitting in the room with pt. Hospice Chaplain-Hillary present on visit.  Pt slept during visit. Staff RN - Danny reported that pt ate 10-20% of her breakfast. Pt has not eaten lunch. Spouse felt pt was comfortable. MSW explored spouse's coping. Spouse expressed concern about pt's future care and prognosis. MSW encouraged spouse to discuss pt's prognosis with doctor now that pt's condition has changed. MSW discussed hospital discharge plans with spouse. It is the family's understanding that pt will go to Skilled facility for rehabilitation at hospital discharge. MSW educated spouse that if pt goes to nursing home for skilled care then pt/family can choose to revoke [facility] services so medicare will help pay snf. . MSW gave spouse copy of Revocation form to show daughter. MSW offered support. MSW met with Danny, staff RN to discuss case. MSW spoke with Nadia, social worker to discuss case.  ° °Debbie Garner, MSW  °

## 2015-05-31 NOTE — Care Management Note (Signed)
Case Management Note  Patient Details  Name: Alexa Hardin MRN: [id number] Date of Birth: [birth year]  Subjective/Objective:                    Action/Plan: Patient s/p surgery. Awaiting PT/OT recs. CM following for discharge disposition.   Expected Discharge Date:                  Expected Discharge Plan:     In-House Referral:     Discharge planning Services     Post Acute Care Choice:    Choice offered to:     DME Arranged:    DME Agency:     HH Arranged:    HH Agency:     Status of Service:  In process, will continue to follow  Medicare Important Message Given:    Date Medicare IM Given:    Medicare IM give by:    Date Additional Medicare IM Given:    Additional Medicare Important Message give by:     If discussed at [place] of Stay Meetings, dates discussed:    Additional Comments:  Pollie Friar, RN 05/31/2015, 1:58 PM

## 2015-05-31 NOTE — Progress Notes (Signed)
   Subjective:  Patient stable  Objective:   VITALS:   Filed Vitals:   05/30/15 2100 05/31/15 0108 05/31/15 0523 05/31/15 1049  BP: 115/57 132/65 106/69 164/83  Pulse: 88 86 87   Temp: 98 F (36.7 C) 100.8 F (38.2 C) 101.1 F (38.4 C) 99.6 F (37.6 C)  TempSrc: Axillary Axillary Axillary Axillary  Resp: '20 21 24   '$ Weight:      SpO2: 100% 100% 99%     Neurovascular intact Sensation intact distally Intact pulses distally Dorsiflexion/Plantar flexion intact Incision: dressing C/D/I and no drainage No cellulitis present Compartment soft   Lab Results  Component Value Date   WBC 7.6 05/31/2015   HGB 9.5* 05/31/2015   HCT 31.5* 05/31/2015   MCV 80.2 05/31/2015   PLT 218 05/31/2015     Assessment/Plan:  1 Day Post-Op   - Expected postop acute blood loss anemia - will monitor for symptoms - Up with PT/OT - DVT ppx - SCDs, ambulation, lovenox - WBAT operative extremity - Pain control - Discharge planning - will need SNF  Marianna Payment 05/31/2015, 12:55 PM [phone]

## 2015-05-31 NOTE — Evaluation (Signed)
Clinical/Bedside Swallow Evaluation Patient Details  Name: Alexa Hardin MRN: [id number] Date of Birth: May 21, 1932  Today's Date: 05/31/2015 Time: SLP Start Time (ACUTE ONLY): 2725 SLP Stop Time (ACUTE ONLY): 3664 SLP Time Calculation (min) (ACUTE ONLY): 26 min  Past Medical History:  Past Medical History  Diagnosis Date  . Hypertension   . Cancer ([place])     Breast/melanoma  . Dementia   . Alzheimer disease   . Unspecified protein-calorie malnutrition ([place])   . Thyroid disease   . Hyperlipemia    Past Surgical History:  Past Surgical History  Procedure Laterality Date  . Lung removal, partial Left 2013  . Mastectomy Bilateral 2013  . Abdominal wall mass resection  06/15/2012  . Anterior approach hemi hip arthroplasty Left 05/30/2015    Procedure: ANTERIOR APPROACH LEFT HIP HEMIARTHROPLASTY;  Surgeon: Leandrew Koyanagi, MD;  Location: [facility];  Service: Orthopedics;  Laterality: Left;   HPI:  82 y.o. female with a past medical history significant for severe dementia, HTN, hx of CVA who presents with fall and LEFT hip fracture. Per chart patient currently enrolled in Hospice.Chart reports pt has history of metastatic melanoma to lung s/p resection and last PET scan without recurrence, however, has had progression of dementia, and in January was admitted for pneumonia, but since then has had a decline in functional status now wheelchair bound, profoundly demented, and enrolled in Hospice. CXR Stable. No evidence of acute disease, COPD and postoperative changes. BSE 01/2015 recommended regular diet and thin liquids with baseline intermittent cough.   Assessment / Plan / Recommendation Clinical Impression  Pt significantly internally distracted (whimpering), poor sustained attention posing increased risk of aspiration. Max-total multimodal assist given throughout assessment. Unable to use straw with SLP and spouse and cup/teaspoon nonfunctional at this time. Mild oral delay with puree with  adequate appearing swallow initiation. Husband reported pt ate steak last week. Discussed/educated factors impacting current status including anethesia (yesterday) medications, severe dementia and unfamiliar environment. Educated various behaviors often seen with advanced dementia in regards to swallowing ability. Recommend downgrade diet to puree, continue thin liquids as pt able and eat only when pt alert and able to attend. Crush meds. ST will follow.         Aspiration Risk  Severe aspiration risk;Risk for inadequate nutrition/hydration    Diet Recommendation Dysphagia 1 (Puree);Thin liquid   Liquid Administration via: Cup;Straw Medication Administration: Crushed with puree Supervision: Staff to assist with self feeding;Full supervision/cueing for compensatory strategies Compensations: Minimize environmental distractions;Slow rate;Small sips/bites Postural Changes: Seated upright at 90 degrees    Other  Recommendations Oral Care Recommendations: Oral care BID   Follow up Recommendations  None    Frequency and Duration min 2x/week  2 weeks       Prognosis Prognosis for Safe Diet Advancement: Fair Barriers to Reach Goals: Cognitive deficits;Severity of deficits      Swallow Study   General HPI: 82 y.o. female with a past medical history significant for severe dementia, HTN, hx of CVA who presents with fall and LEFT hip fracture. Per chart patient currently enrolled in Hospice.Chart reports pt has history of metastatic melanoma to lung s/p resection and last PET scan without recurrence, however, has had progression of dementia, and in January was admitted for pneumonia, but since then has had a decline in functional status now wheelchair bound, profoundly demented, and enrolled in Hospice. CXR Stable. No evidence of acute disease, COPD and postoperative changes. BSE 01/2015 recommended regular diet and thin liquids with  baseline intermittent cough. Type of Study: Bedside Swallow  Evaluation Previous Swallow Assessment:  (see HPI) Diet Prior to this Study: Regular;Thin liquids Temperature Spikes Noted: Yes Respiratory Status: Room air History of Recent Intubation: No Behavior/Cognition: Confused;Requires cueing;Distractible Oral Cavity Assessment: Dry Oral Care Completed by SLP: Yes Oral Cavity - Dentition: Adequate natural dentition Self-Feeding Abilities: Total assist Patient Positioning: Upright in bed Baseline Vocal Quality: Low vocal intensity Volitional Cough: Cognitively unable to elicit Volitional Swallow: Unable to elicit    Oral/Motor/Sensory Function Overall Oral Motor/Sensory Function:  (no focal deficits)   Ice Chips Ice chips: Impaired Presentation: Spoon Oral Phase Impairments: Reduced labial seal;Reduced lingual movement/coordination;Poor awareness of bolus Oral Phase Functional Implications: Prolonged oral transit Pharyngeal Phase Impairments: Suspected delayed Swallow   Thin Liquid Thin Liquid: Impaired Presentation: Cup;Straw;Spoon Oral Phase Impairments: Reduced labial seal;Poor awareness of bolus;Reduced lingual movement/coordination Oral Phase Functional Implications: Right anterior spillage;Left anterior spillage;Prolonged oral transit Pharyngeal  Phase Impairments: Suspected delayed Swallow (question if any bolus swallowed)    Nectar Thick Nectar Thick Liquid: Not tested   Honey Thick Honey Thick Liquid: Not tested   Puree Puree: Impaired Presentation: Spoon Oral Phase Impairments: Reduced labial seal Oral Phase Functional Implications: Prolonged oral transit (mild)   Solid   GO   Solid: Not tested        Houston Siren 05/31/2015,4:25 PM   Orbie Pyo Colvin Caroli.Ed [employer] [phone]

## 2015-06-01 LAB — BASIC METABOLIC PANEL
Anion gap: 10 (ref 5–15)
BUN: 13 mg/dL (ref 6–20)
CHLORIDE: 109 mmol/L (ref 101–111)
CO2: 24 mmol/L (ref 22–32)
CREATININE: 0.67 mg/dL (ref 0.44–1.00)
Calcium: 7.8 mg/dL — ABNORMAL LOW (ref 8.9–10.3)
Glucose, Bld: 118 mg/dL — ABNORMAL HIGH (ref 65–99)
POTASSIUM: 3 mmol/L — AB (ref 3.5–5.1)
SODIUM: 143 mmol/L (ref 135–145)

## 2015-06-01 LAB — CBC
HCT: 28.6 % — ABNORMAL LOW (ref 36.0–46.0)
HEMOGLOBIN: 9 g/dL — AB (ref 12.0–15.0)
MCH: 25.8 pg — AB (ref 26.0–34.0)
MCHC: 31.5 g/dL (ref 30.0–36.0)
MCV: 81.9 fL (ref 78.0–100.0)
PLATELETS: 188 10*3/uL (ref 150–400)
RBC: 3.49 MIL/uL — AB (ref 3.87–5.11)
RDW: 14.8 % (ref 11.5–15.5)
WBC: 6.2 10*3/uL (ref 4.0–10.5)

## 2015-06-01 MED ORDER — POTASSIUM CHLORIDE CRYS ER 20 MEQ PO TBCR
40.0000 meq | EXTENDED_RELEASE_TABLET | Freq: Once | ORAL | Status: AC
  Administered 2015-06-01: 40 meq via ORAL
  Filled 2015-06-01: qty 2

## 2015-06-01 MED ORDER — SODIUM CHLORIDE 0.9 % IV BOLUS (SEPSIS)
250.0000 mL | Freq: Once | INTRAVENOUS | Status: AC
  Administered 2015-06-01: 250 mL via INTRAVENOUS

## 2015-06-01 NOTE — Progress Notes (Signed)
Tech checked vitals and pt BP low, rn alerted. rn did manual BP 80/40. Pt did not get atenolol this am, but did receive nifedipine.  md paged and made aware.

## 2015-06-01 NOTE — Progress Notes (Addendum)
PROGRESS NOTE        PATIENT DETAILS Name: Alexa Hardin Age: 82 y.o. Sex: female Date of Birth: 02/11/1932 Admit Date: 05/27/2015 Admitting Physician Edwin Dada, MD TKZ:[id number],FTDDU F, MD Outpatient Specialists:Dr Burr Medico  Brief Narrative: Patient is a 82 y.o. female with hx of breast cancer, stage 4 Melanoma, advanced dementia being followed by hospice at home, presented to the ED after a mechanical fall at [facility] she was for respite care. Further evaluation revealed left hip fracture, after d/w family, she underwent left hip repair on 5/11. Briefly febrile in the immediate postoperative course, without any obvious foci. Cultures continue to be negative, plans are to discharge back to SNF or back home with hospice in the next few days.   Subjective: Much more calm compared to yesterday. Sleeping comfortably in bed. Occasionally follows commands. Afebrile for the past 24 hours  Assessment/Plan: Principal Problem: Closed subcapital fracture of neck of left femur:Secondary to a mechanical fall-patient very minimally ambulatory with very advanced dementia-family contemplated non operative vs operative treatment,however after further d/w Ortho- Dr Erlinda Hong -undwernt left hip repair on 5/11. Febrile post operatively however is now afebrile for the past 24 hours-UA/CXR negative for infection, blood cultures negative as well, some suspicion for microaspiration- chest x-ray negative for any obvious infiltrates. Plan is to continue to monitor off antibiotics  Active Problems: Dementia with behavioral disturbance:currently-pleasantly confused-occasionally follow blood s commands. Continue supportive care with Seroquel,prn Lorazepam  Dysphagia: Seen by speech therapy, diet has been downgraded to dysphagia 1 diet. Will advise family of risk of aspiration when they are at bedside.  Anemia: Mild perioperative blood loss anemia-no indication of blood transfusion at  present. Follow CBC  Hypothyroidism:continue synthroid  Hypertension: Blood pressure soft today-we will discontinue all antihypertensives (previously on atenolol and ferritin), continue IV fluids and monitor.  Hypokalemia:repleted  Hx of B/L Breast cancer/Stage 4 Melanoma:followed by Dr Ina Homes outpatient Oncology notes-ast CT scan on 03/07/2015 showed no evidence of disease.She was enrolled in hospice program in early 03/2015 due to her overall deconditioning and worsening fatigue and dementia   DVT Prophylaxis: SCD's  Code Status: DNR  Family Communication: NONE at bedside  Disposition Plan: Remain inpatient-suspect will require SNF with palliative/hospice follow up on discharge  Antimicrobial agents: None  Procedures: None  CONSULTS:  orthopedic surgery  Time spent: 25- minutes-Greater than 50% of this time was spent in counseling, explanation of diagnosis, planning of further management, and coordination of care.  MEDICATIONS: Anti-infectives    Start     Dose/Rate Route Frequency Ordered Stop   05/30/15 2200  ceFAZolin (ANCEF) IVPB 2g/100 mL premix     2 g 200 mL/hr over 30 Minutes Intravenous Every 6 hours 05/30/15 2018 05/31/15 0819      Scheduled Meds: . acetaminophen  1,000 mg Oral TID  . docusate sodium  100 mg Oral BID  . enoxaparin (LOVENOX) injection  40 mg Subcutaneous Q24H  . feeding supplement (ENSURE ENLIVE)  237 mL Oral BID BM  . levothyroxine  137 mcg Oral QAC breakfast  . mirtazapine  15 mg Oral QHS  . potassium chloride  20 mEq Oral Daily  . QUEtiapine  25 mg Oral BID  . senna-docusate  2 tablet Oral BID  . sodium chloride  250 mL Intravenous Once  . tranexamic acid (CYKLOKAPRON) topical -INTRAOP  2,000 mg Topical Once  Continuous Infusions: . 0.9 % NaCl with KCl 40 mEq / L 40 mL/hr (06/01/15 1001)  . lactated ringers     PRN Meds:.alum & mag hydroxide-simeth, bisacodyl, hydrALAZINE, LORazepam, menthol-cetylpyridinium **OR**  phenol, methocarbamol **OR** methocarbamol (ROBAXIN)  IV, metoCLOPramide **OR** metoCLOPramide (REGLAN) injection, morphine injection, morphine injection, ondansetron **OR** ondansetron (ZOFRAN) IV, oxyCODONE, polyethylene glycol   PHYSICAL EXAM: Vital signs: Filed Vitals:   06/01/15 1010 06/01/15 1441 06/01/15 1449 06/01/15 1500  BP: 1'03/46 83/36 80/40 '$ 84/36  Pulse: 67 79 80   Temp: 98 F (36.7 C) 98.4 F (36.9 C)    TempSrc: Axillary Oral    Resp: '14 16 15   '$ Weight:      SpO2: 100% 100%     Filed Weights   05/28/15 0433  Weight: 51.211 kg (112 lb 14.4 oz)   Body mass index is 16.66 kg/(m^2).   Gen Exam: Awake, pleasantly confused. Slightly agitated this am Neck: Supple, No JVD.   Chest: B/L Clear.   CVS: S1 S2 Regular Abdomen: soft, BS +, non tender, non distended.  Extremities: no edema, lower extremities warm to touch. Neurologic: Moves all 4 ext-but difficult Exam Skin: No Rash or lesions   Wounds: N/A.    LABORATORY DATA: CBC:  Recent Labs Lab 05/27/15 2249 05/28/15 0835 05/30/15 2112 05/31/15 0409 06/01/15 0309  WBC 8.5 9.6 8.9 7.6 6.2  NEUTROABS 7.1  --   --   --   --   HGB 11.6* 11.2* 10.3* 9.5* 9.0*  HCT 36.7 36.7 33.1* 31.5* 28.6*  MCV 81.0 81.0 80.0 80.2 81.9  PLT 327 293 223 218 638    Basic Metabolic Panel:  Recent Labs Lab 05/27/15 2249 05/28/15 0835 05/30/15 2112 05/31/15 0409 06/01/15 0309  NA 143 145  --  140 143  K 3.0* 3.7  --  4.0 3.0*  CL 102 107  --  105 109  CO2 27 27  --  24 24  GLUCOSE 100* 97  --  107* 118*  BUN 20 16  --  12 13  CREATININE 0.72 0.72 0.76 0.68 0.67  CALCIUM 9.3 8.9  --  7.9* 7.8*  MG  --  1.5*  --   --   --     GFR: Estimated Creatinine Clearance: 45.3 mL/min (by C-G formula based on Cr of 0.67).  Liver Function Tests: No results for input(s): AST, ALT, ALKPHOS, BILITOT, PROT, ALBUMIN in the last 168 hours. No results for input(s): LIPASE, AMYLASE in the last 168 hours. No results for input(s):  AMMONIA in the last 168 hours.  Coagulation Profile:  Recent Labs Lab 05/27/15 2249  INR 1.04    Cardiac Enzymes: No results for input(s): CKTOTAL, CKMB, CKMBINDEX, TROPONINI in the last 168 hours.  BNP (last 3 results) No results for input(s): PROBNP in the last 8760 hours.  HbA1C: No results for input(s): HGBA1C in the last 72 hours.  CBG: No results for input(s): GLUCAP in the last 168 hours.  Lipid Profile: No results for input(s): CHOL, HDL, LDLCALC, TRIG, CHOLHDL, LDLDIRECT in the last 72 hours.  Thyroid Function Tests: No results for input(s): TSH, T4TOTAL, FREET4, T3FREE, THYROIDAB in the last 72 hours.  Anemia Panel: No results for input(s): VITAMINB12, FOLATE, FERRITIN, TIBC, IRON, RETICCTPCT in the last 72 hours.  Urine analysis:    Component Value Date/Time   COLORURINE YELLOW 05/31/2015 0755   APPEARANCEUR HAZY* 05/31/2015 0755   LABSPEC 1.028 05/31/2015 0755   PHURINE 5.5 05/31/2015 0755   GLUCOSEU  NEGATIVE 05/31/2015 0755   HGBUR SMALL* 05/31/2015 0755   BILIRUBINUR NEGATIVE 05/31/2015 0755   KETONESUR 15* 05/31/2015 0755   PROTEINUR 30* 05/31/2015 0755   NITRITE NEGATIVE 05/31/2015 0755   LEUKOCYTESUR NEGATIVE 05/31/2015 0755    Sepsis Labs: Lactic Acid, Venous No results found for: LATICACIDVEN  MICROBIOLOGY: Recent Results (from the past 240 hour(s))  Surgical pcr screen     Status: None   Collection Time: 05/30/15  6:16 AM  Result Value Ref Range Status   MRSA, PCR NEGATIVE NEGATIVE Final   Staphylococcus aureus NEGATIVE NEGATIVE Final    Comment:        The Xpert SA Assay (FDA approved for NASAL specimens in patients over 16 years of age), is one component of a comprehensive surveillance program.  Test performance has been validated by [facility] for patients greater than or equal to 25 year old. It is not intended to diagnose infection nor to guide or monitor treatment.   Culture, blood (routine x 2)     Status: None  (Preliminary result)   Collection Time: 05/31/15 12:00 PM  Result Value Ref Range Status   Specimen Description BLOOD LEFT ANTECUBITAL  Final   Special Requests IN PEDIATRIC BOTTLE 3CC  Final   Culture NO GROWTH < 24 HOURS  Final   Report Status PENDING  Incomplete  Culture, blood (routine x 2)     Status: None (Preliminary result)   Collection Time: 05/31/15 12:08 PM  Result Value Ref Range Status   Specimen Description BLOOD BLOOD LEFT HAND  Final   Special Requests IN PEDIATRIC BOTTLE 5ML  Final   Culture NO GROWTH < 24 HOURS  Final   Report Status PENDING  Incomplete    RADIOLOGY STUDIES/RESULTS: Dg Chest 1 View  05/27/2015  CLINICAL DATA:  Left hip fracture. Malignant melanoma in bilateral breast carcinoma. Pre-op respiratory exam EXAM: CHEST 1 VIEW COMPARISON:  03/17/2015 FINDINGS: Right-sided Port-A-Cath remains in appropriate position. Mild cardiomegaly is stable. Pulmonary hyperinflation is seen, consistent with COPD. No evidence of acute infiltrate or pleural effusion. IMPRESSION: Stable COPD and mild cardiomegaly.  No acute findings. Electronically Signed   By: Earle Gell M.D.   On: 05/27/2015 22:48   Ct Head Wo Contrast  05/28/2015  CLINICAL DATA:  Initial valuation for acute trauma, fall. EXAM: CT HEAD WITHOUT CONTRAST TECHNIQUE: Contiguous axial images were obtained from the base of the skull through the vertex without intravenous contrast. COMPARISON:  Prior study from 03/17/2015. FINDINGS: Diffuse prominence of the CSF containing spaces is compatible with generalized age-related cerebral atrophy. Patchy hypodensity within the periventricular and deep white matter most compatible with chronic small vessel ischemic disease. Vascular calcifications within the carotid siphons. Overall, these changes are stable. No acute intracranial hemorrhage. No acute large vessel territory infarct. No mass lesion, midline shift, or mass effect. Ventricular prominence related to global parenchymal  volume loss present, stable. No hydrocephalus. No extra-axial fluid collection. Scalp soft tissues demonstrate no acute abnormality. No acute abnormality about the orbits. Paranasal sinuses are clear.  No mastoid effusion. Calvarium intact. IMPRESSION: 1. No acute intracranial process. 2. Moderate diffuse atrophy with chronic small vessel ischemic disease, stable. Electronically Signed   By: Jeannine Boga M.D.   On: 05/28/2015 01:25   Pelvis Portable  05/30/2015  CLINICAL DATA:  Postop from left hip arthroplasty. Left femoral neck fracture. EXAM: PORTABLE PELVIS 1-2 VIEWS COMPARISON:  05/27/2015 FINDINGS: A left hip prosthesis is now seen in expected position with overlying skin staples.  There is no evidence of fracture or dislocation. Previously demonstrated right hip prosthesis is unchanged. Generalized osteopenia noted. IMPRESSION: Expected postoperative appearance of left hip prosthesis. No complication identified. Electronically Signed   By: Earle Gell M.D.   On: 05/30/2015 19:51   Dg Chest Port 1 View  05/31/2015  CLINICAL DATA:  Shortness of breath EXAM: PORTABLE CHEST 1 VIEW COMPARISON:  05/27/2015 FINDINGS: Chronic hyperinflation. Postoperative changes on the left with volume loss and scarring. Stable right apical thickening that is scar based on recent chest CT. Enlarged appearance of the heart, accentuated by postoperative distortion. Stable cardiomediastinal contours. IMPRESSION: 1. Stable.  No evidence of acute disease. 2. COPD and postoperative changes. Electronically Signed   By: Monte Fantasia M.D.   On: 05/31/2015 08:20   Dg Hip Operative Unilat W Or W/o Pelvis Left  05/30/2015  CLINICAL DATA:  Status post total hip replacement EXAM: OPERATIVE LEFT HIP   1 VIEW TECHNIQUE: Fluoroscopic spot image(s) were submitted for interpretation post-operatively. COMPARISON:  May a 2017 FLUOROSCOPY TIME:  0 minutes 22 seconds; 3 acquired images FINDINGS: Frontal view obtained. There is a total  hip replacement on the left with prosthetic component well-seated on this single view. No fracture or dislocation evident. Total hip replacement also noted on the right. IMPRESSION: Left total hip prosthesis well-seated on frontal view. No demonstrable fracture or dislocation. Electronically Signed   By: Lowella Grip III M.D.   On: 05/30/2015 18:35   Dg Hip Unilat With Pelvis 2-3 Views Left  05/27/2015  CLINICAL DATA:  Left hip injury and pain after jumping out of wheelchair. Initial encounter. EXAM: DG HIP (WITH OR WITHOUT PELVIS) 2-3V LEFT COMPARISON:  None. FINDINGS: A subcapital left femoral neck fracture is seen. No evidence of dislocation. Generalized osteopenia noted. No pelvic fracture identified. Bipolar right hip prosthesis noted as well as generalized osteopenia. IMPRESSION: Subcapital left femoral neck fracture. Diffuse osteopenia. Electronically Signed   By: Earle Gell M.D.   On: 05/27/2015 22:46     LOS: 4 days   Oren Binet, MD  [facility] Pager:[phone]  If 7PM-7AM, please contact night-coverage [email or web address] Password [facility] 06/01/2015, 3:04 PM

## 2015-06-01 NOTE — Progress Notes (Signed)
Pt has decreased appetite. Has not been eating. Pt barely takes spoonful of icecream with medications. md made aware and states pt is at her baseline.   Will continue to monitor pt for urinary retention, if continues will have to insert foley catheter.   Per husband pt received PNA vaccine in last year.

## 2015-06-01 NOTE — Progress Notes (Signed)
[facility]20[facility] RN Visit  This is a related, covered GIP admission from 05/28/15 to [facility] diagnosis of Alzheimer's Disease, per MD Monguilod. Code status is DNR. She was admitted status post fall and sustained left hip fracture  Patient was seen in room. She is resting comfortably, did not arouse.  Appears relaxed and comfortable. Has 02 in place at 2L.   Breakfast tray has not been touched.   There was no family present.  Per chart review patient has not been eating and is refusing oral supplement as well.   She has been seen by speech therapy and determined to be high risk for aspiration.   Foley catheter was removed  yesterday. Patient required in/out catheterization this morning due to urinary retention.    Pain is now being managed with po tylenol 1000 mg TID.  She has not received any IV pain medications in the past 24 hours.  Received one prn dose of oxycodone  '5mg'$   Yesterday morning.  She is receiving IVF with potassium.    Hospice willl continue to follow patient and anticipate discharge needs.  Please call with any questions or concerns.    Thank you!   Mickie Kay, [place]  [phone]

## 2015-06-01 NOTE — Progress Notes (Signed)
Patient bladder scan, >400 mL. RN completed in/out urinary catheter removing 500 mL. MD notified.

## 2015-06-01 NOTE — Progress Notes (Signed)
Daughter at bedside with pts husband   Daughter was under impression pt had a UTI and kidney issues. From what this rn can tell from reading notes, pt is not being treated for UTI/ not on antibiotics, or have any kidney issues listed as chief complaints.  Daughter seems confused and agitated. Asked if over night if staff had gotten pt up to bedside commode. rn explained that pt was not gotten up and was having urinary retention and had in and out performed this morning.   Daughter does not want pt to be discharged on Monday, is not happy with current facility pt was at, was not happy with the list of facilities provided to her. Wants to look at facilities in [facility].   rn explained that per ortho note, pt was stable, and that hospitalist had been by room twice today, and spoke with pts husband.

## 2015-06-01 NOTE — Progress Notes (Signed)
   Subjective:  Patient stable  Objective:   VITALS:   Filed Vitals:   05/31/15 1755 05/31/15 2138 06/01/15 0116 06/01/15 0540  BP: 115/53  116/45 103/48  Pulse: 95 94 76 72  Temp: 98 F (36.7 C) 98.4 F (36.9 C) 98.8 F (37.1 C) 98.2 F (36.8 C)  TempSrc: Axillary Axillary Axillary Axillary  Resp: '20  18 18  '$ Weight:      SpO2: 95% 98% 100% 100%    Neurovascular intact Sensation intact distally Intact pulses distally Dorsiflexion/Plantar flexion intact Incision: dressing C/D/I and no drainage No cellulitis present Compartment soft   Lab Results  Component Value Date   WBC 6.2 06/01/2015   HGB 9.0* 06/01/2015   HCT 28.6* 06/01/2015   MCV 81.9 06/01/2015   PLT 188 06/01/2015     Assessment/Plan:  2 Days Post-Op   - stable from ortho stand point  Marianna Payment 06/01/2015, 9:03 AM [phone]

## 2015-06-01 NOTE — Progress Notes (Signed)
Husband attempting to feed pt sherbert, pt started coughing after a few bites. rn told husband to stop feeding sherbert at this time.

## 2015-06-02 DIAGNOSIS — S72011A Unspecified intracapsular fracture of right femur, initial encounter for closed fracture: Secondary | ICD-10-CM

## 2015-06-02 DIAGNOSIS — E038 Other specified hypothyroidism: Secondary | ICD-10-CM

## 2015-06-02 DIAGNOSIS — E876 Hypokalemia: Secondary | ICD-10-CM

## 2015-06-02 DIAGNOSIS — F0391 Unspecified dementia with behavioral disturbance: Secondary | ICD-10-CM

## 2015-06-02 LAB — BASIC METABOLIC PANEL
ANION GAP: 8 (ref 5–15)
BUN: 15 mg/dL (ref 6–20)
CALCIUM: 8.2 mg/dL — AB (ref 8.9–10.3)
CO2: 26 mmol/L (ref 22–32)
CREATININE: 0.53 mg/dL (ref 0.44–1.00)
Chloride: 114 mmol/L — ABNORMAL HIGH (ref 101–111)
GFR calc Af Amer: >60 mL/min (ref >60)
GLUCOSE: 99 mg/dL (ref 65–99)
Potassium: 4 mmol/L (ref 3.5–5.1)
Sodium: 148 mmol/L — ABNORMAL HIGH (ref 135–145)

## 2015-06-02 MED ORDER — POTASSIUM CHLORIDE CRYS ER 20 MEQ PO TBCR: 20.0000 meq | EXTENDED_RELEASE_TABLET | Freq: Every day | ORAL | Status: DC

## 2015-06-02 MED ORDER — TAMSULOSIN HCL 0.4 MG PO CAPS
0.4000 mg | ORAL_CAPSULE | Freq: Every day | ORAL | Status: DC
  Administered 2015-06-02 – 2015-06-04 (×3): 0.4 mg via ORAL
  Filled 2015-06-02 (×3): qty 1

## 2015-06-02 MED ORDER — DEXTROSE-NACL 5-0.45 % IV SOLN: INTRAVENOUS | Status: DC

## 2015-06-02 MED ORDER — DEXTROSE 5 % IV SOLN
INTRAVENOUS | Status: AC
  Administered 2015-06-02: 09:00:00 via INTRAVENOUS
  Administered 2015-06-03: 1000 mL via INTRAVENOUS

## 2015-06-02 NOTE — Progress Notes (Signed)
[facility] - w/e Social Work Visit  This is a related and covered GIP admission from 05/28/15 with [facility] diagnosis of Alzheimer's Disease per Dr. Allie Dimmer Monguilod. Code status is DNR. Patient was admitted with left hip fracture s/p fall at [facility] during a respite stay. She s/p left hip hemiarthroplasty 05/30/15.   Patient seen in room, sitting up in bed, no family present. She greeted me and continued speaking but no meaningful conversation. She appeared to grimace from time to time and touched her hands together. She is currently in mitts. Her breakfast tray has not been touched. She continues on 2L 02. She is currently being monitored for urinary retention and is scheduled for bladder scan at 11:00. She is being managed with PO Tylenol 1000 mg TID. She received PRN ativan overnight.     Hospice will continue to follow and anticipate discharge needs. Please call with questions or concerns.   Thank you,  Erling Conte, [place] Liaison [phone]

## 2015-06-02 NOTE — Progress Notes (Signed)
Bladder scan 391 ml obtained by NT Tamara. MD [facility] paged with results. Order received to place Foley cath.

## 2015-06-02 NOTE — Progress Notes (Addendum)
PROGRESS NOTE        PATIENT DETAILS Name: Alexa Hardin Age: 82 y.o. Sex: female Date of Birth: 03-22-1932 Admit Date: 05/27/2015 Admitting Physician Edwin Dada, MD URK:[id number],EGBTD F, MD Outpatient Specialists:Dr Burr Medico  Brief Narrative: Patient is a 82 y.o. female with hx of breast cancer, stage 4 Melanoma, advanced dementia being followed by hospice at home, presented to the ED after a mechanical fall at [facility] she was for respite care. Further evaluation revealed left hip fracture, after d/w family, she underwent left hip repair on 5/11. Briefly febrile in the immediate postoperative course, without any obvious foci. Cultures continue to be negative, plans are to discharge back to SNF or back home with hospice in the next few days.   Subjective:  Calm but confused. Sleeping comfortably in bed. Occasionally follows commands. Afebrile for the past 24 hours  Assessment/Plan:   Closed subcapital fracture of neck of left femur: Secondary to a mechanical fall-patient very minimally ambulatory with very advanced dementia-post ORIF by Ortho- Dr Erlinda Hong on 5/11. Febrile post operatively however is now afebrile for the past 48 hours- UA/CXR negative for infection, blood cultures negative as well, some suspicion for microaspiration-  chest x-ray negative for any obvious infiltrates. Plan is to continue to monitor off antibiotics. Mild perioperative blood loss related anemia - monitor, Lovenox for DVT prophylaxis, WBAT per Ortho, will need SNF.   Dementia with behavioral disturbance:currently-pleasantly confused-occasionally follow blood s commands. Continue supportive care with Seroquel,prn Lorazepam  Dysphagia: Seen by speech therapy, diet has been downgraded to dysphagia 1 diet. Will advise family of risk of aspiration when they are at bedside.  Anemia: Mild perioperative blood loss anemia-no indication of blood transfusion at present. Follow  CBC  Hypothyroidism:continue synthroid  Hypertension: Blood pressure soft today-we will discontinue all antihypertensives (previously on atenolol and ferritin), continue IV fluids and monitor.  Hypokalemia:repleted  Hx of B/L Breast cancer/Stage 4 Melanoma: followed by Dr Ina Homes outpatient Oncology notes-ast CT scan on 03/07/2015 showed no evidence of disease.She was enrolled in hospice program in early 03/2015 due to her overall deconditioning and worsening fatigue and dementia   Ur Retention - Flomax, if continues Foley  Hypernatremia - gentle D5W.   DVT Prophylaxis: Lovenox - SCD's  Code Status: DNR  Family Communication: NONE at bedside  Disposition Plan: SNF   Antimicrobial agents: None  Procedures: L Hip ORIF by Dr Erlinda Hong 05-31-15  CONSULTS:  orthopedic surgery  Time spent: 25- minutes-Greater than 50% of this time was spent in counseling, explanation of diagnosis, planning of further management, and coordination of care.  MEDICATIONS: Anti-infectives    Start     Dose/Rate Route Frequency Ordered Stop   05/30/15 2200  ceFAZolin (ANCEF) IVPB 2g/100 mL premix     2 g 200 mL/hr over 30 Minutes Intravenous Every 6 hours 05/30/15 2018 05/31/15 0819      Scheduled Meds: . acetaminophen  1,000 mg Oral TID  . docusate sodium  100 mg Oral BID  . enoxaparin (LOVENOX) injection  40 mg Subcutaneous Q24H  . feeding supplement (ENSURE ENLIVE)  237 mL Oral BID BM  . levothyroxine  137 mcg Oral QAC breakfast  . mirtazapine  15 mg Oral QHS  . potassium chloride  20 mEq Oral Daily  . QUEtiapine  25 mg Oral BID  . senna-docusate  2 tablet Oral BID  .  tranexamic acid (CYKLOKAPRON) topical -INTRAOP  2,000 mg Topical Once   Continuous Infusions: . dextrose 50 mL/hr at 06/02/15 0846   PRN Meds:.alum & mag hydroxide-simeth, bisacodyl, hydrALAZINE, LORazepam, morphine injection, morphine injection, [DISCONTINUED] ondansetron **OR** ondansetron (ZOFRAN) IV, oxyCODONE,  polyethylene glycol   PHYSICAL EXAM: Vital signs: Filed Vitals:   06/02/15 0225 06/02/15 0450 06/02/15 0532 06/02/15 0600  BP: 97/39 112/47 103/46 113/43  Pulse: 76 76 75 71  Temp: 97.5 F (36.4 C) 97.6 F (36.4 C)    TempSrc: Axillary Axillary    Resp: '18 18 18 18  '$ Height:      Weight:      SpO2: 97% 97% 98% 100%   Filed Weights   05/28/15 0433 06/01/15 1700  Weight: 51.211 kg (112 lb 14.4 oz) 51.2 kg (112 lb 14 oz)   Body mass index is 17.67 kg/(m^2).   Gen Exam: Awake, pleasantly confused. Slightly agitated this am Neck: Supple, No JVD.   Chest: B/L Clear.   CVS: S1 S2 Regular Abdomen: soft, BS +, non tender, non distended.  Extremities: no edema, lower extremities warm to touch. Neurologic: Moves all 4 ext-but difficult Exam Skin: No Rash or lesions, L hip scar site stable.   Wounds: N/A.    LABORATORY DATA: CBC:  Recent Labs Lab 05/27/15 2249 05/28/15 0835 05/30/15 2112 05/31/15 0409 06/01/15 0309  WBC 8.5 9.6 8.9 7.6 6.2  NEUTROABS 7.1  --   --   --   --   HGB 11.6* 11.2* 10.3* 9.5* 9.0*  HCT 36.7 36.7 33.1* 31.5* 28.6*  MCV 81.0 81.0 80.0 80.2 81.9  PLT 327 293 223 218 892    Basic Metabolic Panel:  Recent Labs Lab 05/27/15 2249 05/28/15 0835 05/30/15 2112 05/31/15 0409 06/01/15 0309 06/02/15 0435  NA 143 145  --  140 143 148*  K 3.0* 3.7  --  4.0 3.0* 4.0  CL 102 107  --  105 109 114*  CO2 27 27  --  '24 24 26  '$ GLUCOSE 100* 97  --  107* 118* 99  BUN 20 16  --  '12 13 15  '$ CREATININE 0.72 0.72 0.76 0.68 0.67 0.53  CALCIUM 9.3 8.9  --  7.9* 7.8* 8.2*  MG  --  1.5*  --   --   --   --     GFR: Estimated Creatinine Clearance: 45.3 mL/min (by C-G formula based on Cr of 0.53).  Liver Function Tests: No results for input(s): AST, ALT, ALKPHOS, BILITOT, PROT, ALBUMIN in the last 168 hours. No results for input(s): LIPASE, AMYLASE in the last 168 hours. No results for input(s): AMMONIA in the last 168 hours.  Coagulation Profile:  Recent  Labs Lab 05/27/15 2249  INR 1.04    Cardiac Enzymes: No results for input(s): CKTOTAL, CKMB, CKMBINDEX, TROPONINI in the last 168 hours.  BNP (last 3 results) No results for input(s): PROBNP in the last 8760 hours.  HbA1C: No results for input(s): HGBA1C in the last 72 hours.  CBG: No results for input(s): GLUCAP in the last 168 hours.  Lipid Profile: No results for input(s): CHOL, HDL, LDLCALC, TRIG, CHOLHDL, LDLDIRECT in the last 72 hours.  Thyroid Function Tests: No results for input(s): TSH, T4TOTAL, FREET4, T3FREE, THYROIDAB in the last 72 hours.  Anemia Panel: No results for input(s): VITAMINB12, FOLATE, FERRITIN, TIBC, IRON, RETICCTPCT in the last 72 hours.  Urine analysis:    Component Value Date/Time   COLORURINE YELLOW 05/31/2015 0755   APPEARANCEUR  HAZY* 05/31/2015 0755   LABSPEC 1.028 05/31/2015 0755   PHURINE 5.5 05/31/2015 0755   GLUCOSEU NEGATIVE 05/31/2015 0755   HGBUR SMALL* 05/31/2015 0755   BILIRUBINUR NEGATIVE 05/31/2015 0755   KETONESUR 15* 05/31/2015 0755   PROTEINUR 30* 05/31/2015 0755   NITRITE NEGATIVE 05/31/2015 0755   LEUKOCYTESUR NEGATIVE 05/31/2015 0755    Sepsis Labs: Lactic Acid, Venous No results found for: LATICACIDVEN  MICROBIOLOGY: Recent Results (from the past 240 hour(s))  Surgical pcr screen     Status: None   Collection Time: 05/30/15  6:16 AM  Result Value Ref Range Status   MRSA, PCR NEGATIVE NEGATIVE Final   Staphylococcus aureus NEGATIVE NEGATIVE Final    Comment:        The Xpert SA Assay (FDA approved for NASAL specimens in patients over 21 years of age), is one component of a comprehensive surveillance program.  Test performance has been validated by [facility] for patients greater than or equal to 50 year old. It is not intended to diagnose infection nor to guide or monitor treatment.   Culture, blood (routine x 2)     Status: None (Preliminary result)   Collection Time: 05/31/15 12:00 PM  Result  Value Ref Range Status   Specimen Description BLOOD LEFT ANTECUBITAL  Final   Special Requests IN PEDIATRIC BOTTLE 3CC  Final   Culture NO GROWTH < 24 HOURS  Final   Report Status PENDING  Incomplete  Culture, blood (routine x 2)     Status: None (Preliminary result)   Collection Time: 05/31/15 12:08 PM  Result Value Ref Range Status   Specimen Description BLOOD BLOOD LEFT HAND  Final   Special Requests IN PEDIATRIC BOTTLE 5ML  Final   Culture NO GROWTH < 24 HOURS  Final   Report Status PENDING  Incomplete    RADIOLOGY STUDIES/RESULTS: Dg Chest 1 View  05/27/2015  CLINICAL DATA:  Left hip fracture. Malignant melanoma in bilateral breast carcinoma. Pre-op respiratory exam EXAM: CHEST 1 VIEW COMPARISON:  03/17/2015 FINDINGS: Right-sided Port-A-Cath remains in appropriate position. Mild cardiomegaly is stable. Pulmonary hyperinflation is seen, consistent with COPD. No evidence of acute infiltrate or pleural effusion. IMPRESSION: Stable COPD and mild cardiomegaly.  No acute findings. Electronically Signed   By: Earle Gell M.D.   On: 05/27/2015 22:48   Ct Head Wo Contrast  05/28/2015  CLINICAL DATA:  Initial valuation for acute trauma, fall. EXAM: CT HEAD WITHOUT CONTRAST TECHNIQUE: Contiguous axial images were obtained from the base of the skull through the vertex without intravenous contrast. COMPARISON:  Prior study from 03/17/2015. FINDINGS: Diffuse prominence of the CSF containing spaces is compatible with generalized age-related cerebral atrophy. Patchy hypodensity within the periventricular and deep white matter most compatible with chronic small vessel ischemic disease. Vascular calcifications within the carotid siphons. Overall, these changes are stable. No acute intracranial hemorrhage. No acute large vessel territory infarct. No mass lesion, midline shift, or mass effect. Ventricular prominence related to global parenchymal volume loss present, stable. No hydrocephalus. No extra-axial fluid  collection. Scalp soft tissues demonstrate no acute abnormality. No acute abnormality about the orbits. Paranasal sinuses are clear.  No mastoid effusion. Calvarium intact. IMPRESSION: 1. No acute intracranial process. 2. Moderate diffuse atrophy with chronic small vessel ischemic disease, stable. Electronically Signed   By: Jeannine Boga M.D.   On: 05/28/2015 01:25   Pelvis Portable  05/30/2015  CLINICAL DATA:  Postop from left hip arthroplasty. Left femoral neck fracture. EXAM: PORTABLE PELVIS 1-2 VIEWS  COMPARISON:  05/27/2015 FINDINGS: A left hip prosthesis is now seen in expected position with overlying skin staples. There is no evidence of fracture or dislocation. Previously demonstrated right hip prosthesis is unchanged. Generalized osteopenia noted. IMPRESSION: Expected postoperative appearance of left hip prosthesis. No complication identified. Electronically Signed   By: Earle Gell M.D.   On: 05/30/2015 19:51   Dg Chest Port 1 View  05/31/2015  CLINICAL DATA:  Shortness of breath EXAM: PORTABLE CHEST 1 VIEW COMPARISON:  05/27/2015 FINDINGS: Chronic hyperinflation. Postoperative changes on the left with volume loss and scarring. Stable right apical thickening that is scar based on recent chest CT. Enlarged appearance of the heart, accentuated by postoperative distortion. Stable cardiomediastinal contours. IMPRESSION: 1. Stable.  No evidence of acute disease. 2. COPD and postoperative changes. Electronically Signed   By: Monte Fantasia M.D.   On: 05/31/2015 08:20   Dg Hip Operative Unilat W Or W/o Pelvis Left  05/30/2015  CLINICAL DATA:  Status post total hip replacement EXAM: OPERATIVE LEFT HIP   1 VIEW TECHNIQUE: Fluoroscopic spot image(s) were submitted for interpretation post-operatively. COMPARISON:  May a 2017 FLUOROSCOPY TIME:  0 minutes 22 seconds; 3 acquired images FINDINGS: Frontal view obtained. There is a total hip replacement on the left with prosthetic component well-seated on  this single view. No fracture or dislocation evident. Total hip replacement also noted on the right. IMPRESSION: Left total hip prosthesis well-seated on frontal view. No demonstrable fracture or dislocation. Electronically Signed   By: Lowella Grip III M.D.   On: 05/30/2015 18:35   Dg Hip Unilat With Pelvis 2-3 Views Left  05/27/2015  CLINICAL DATA:  Left hip injury and pain after jumping out of wheelchair. Initial encounter. EXAM: DG HIP (WITH OR WITHOUT PELVIS) 2-3V LEFT COMPARISON:  None. FINDINGS: A subcapital left femoral neck fracture is seen. No evidence of dislocation. Generalized osteopenia noted. No pelvic fracture identified. Bipolar right hip prosthesis noted as well as generalized osteopenia. IMPRESSION: Subcapital left femoral neck fracture. Diffuse osteopenia. Electronically Signed   By: Earle Gell M.D.   On: 05/27/2015 22:46     LOS: 5 days   Thurnell Lose, MD  [facility] Pager:[phone]  If 7PM-7AM, please contact night-coverage [email or web address] Password [facility] 06/02/2015, 9:56 AM

## 2015-06-02 NOTE — Progress Notes (Signed)
Patient very anxious and agitated, 0.5 ativan PO administered at 2004. Patient refused medications. RN will re-attempt medication administration once patient is more calm. RN encouraged patients family to minimize environmental stimuli. RN turned lights down low, closed patients door to decrease noise. RN will continue to monitor.

## 2015-06-02 NOTE — Progress Notes (Signed)
Spoke with oncall MD. Plan for urinary retention is to continue to monitor for the next 6 hours per protocol and MD will determine if urinary catheter is necessary. Patient lab results reviewed, orders being placed by MD.

## 2015-06-02 NOTE — Progress Notes (Signed)
   06/02/15 0515  Urine Characteristics  Urinary Incontinence (patient not voiding)  Urine Color Amber  Urine Appearance Other (Comment) (one small dark brown piece of sediment )  Urine Odor Other (Comment) (concentrated odor)  Urinary Interventions Bladder scan;Intermittent/Straight cath  Bladder Scan Volume (mL) 550 mL  Intermittent/Straight Cath (mL) 725 mL   Intermittent, straight urinary catheter x 2 0513/17 and 06/02/15. Urine sample collected if needed. On-call MD notified. RN will continue to monitor

## 2015-06-03 DIAGNOSIS — S72012A Unspecified intracapsular fracture of left femur, initial encounter for closed fracture: Principal | ICD-10-CM

## 2015-06-03 LAB — BASIC METABOLIC PANEL
ANION GAP: 7 (ref 5–15)
BUN: 11 mg/dL (ref 6–20)
CALCIUM: 7.9 mg/dL — AB (ref 8.9–10.3)
CO2: 25 mmol/L (ref 22–32)
Chloride: 109 mmol/L (ref 101–111)
Creatinine, Ser: 0.51 mg/dL (ref 0.44–1.00)
GFR calc Af Amer: >60 mL/min (ref >60)
GFR calc non Af Amer: >60 mL/min (ref >60)
GLUCOSE: 131 mg/dL — AB (ref 65–99)
Potassium: 3.7 mmol/L (ref 3.5–5.1)
Sodium: 141 mmol/L (ref 135–145)

## 2015-06-03 LAB — CBC
HEMATOCRIT: 27.8 % — AB (ref 36.0–46.0)
HEMOGLOBIN: 8.4 g/dL — AB (ref 12.0–15.0)
MCH: 24.6 pg — AB (ref 26.0–34.0)
MCHC: 30.2 g/dL (ref 30.0–36.0)
MCV: 81.3 fL (ref 78.0–100.0)
Platelets: 255 10*3/uL (ref 150–400)
RBC: 3.42 MIL/uL — ABNORMAL LOW (ref 3.87–5.11)
RDW: 14.6 % (ref 11.5–15.5)
WBC: 7 10*3/uL (ref 4.0–10.5)

## 2015-06-03 MED ORDER — HYDROCODONE-ACETAMINOPHEN 7.5-325 MG PO TABS: 1.0000 | ORAL_TABLET | Freq: Four times a day (QID) | ORAL | Status: AC | PRN

## 2015-06-03 MED ORDER — LORAZEPAM 0.5 MG PO TABS: 0.5000 mg | ORAL_TABLET | Freq: Three times a day (TID) | ORAL | Status: AC | PRN

## 2015-06-03 MED ORDER — TAMSULOSIN HCL 0.4 MG PO CAPS: 0.4000 mg | ORAL_CAPSULE | Freq: Every day | ORAL | Status: AC

## 2015-06-03 MED ORDER — ENSURE ENLIVE PO LIQD: 237.0000 mL | Freq: Two times a day (BID) | ORAL | Status: AC

## 2015-06-03 MED ORDER — DOCUSATE SODIUM 100 MG PO CAPS
200.0000 mg | ORAL_CAPSULE | Freq: Two times a day (BID) | ORAL | Status: DC
  Administered 2015-06-03 – 2015-06-04 (×2): 200 mg via ORAL
  Filled 2015-06-03 (×2): qty 2

## 2015-06-03 NOTE — Progress Notes (Signed)
Physical Therapy Treatment Patient Details Name: Alexa Hardin MRN: [id number] DOB: 18-May-1932 Today's Date: 06/03/2015    History of Present Illness Patient is a 82 y.o. female with hx of breast cancer, stage 4 Melanoma, advanced dementia being followed by hospice at home, presented to the ED after a mechanical fall at [facility] she was for respite care.Presented with L hip fracture, now s/p left hip hemiarthroplasty on 5/11.    PT Comments    Patient progressing with improved sitting balance and talking with words throughout this session.  Fatigued sitting EOB and was confused by lift equipment attempted for sit to stand, but seems as if tolerating mobility better and may have potential with continued skilled PT in SNF setting to regain some function.  Still would defer to family as to d/c plan.  Follow Up Recommendations  SNF     Equipment Recommendations  None recommended by PT    Recommendations for Other Services       Precautions / Restrictions Precautions Precautions: Fall Restrictions LLE Weight Bearing: Weight bearing as tolerated    Mobility  Bed Mobility Overal bed mobility: Needs Assistance Bed Mobility: Rolling Rolling: Max assist   Supine to sit: +2 for physical assistance;Max assist Sit to supine: Max assist;+2 for physical assistance   General bed mobility comments: assist for legs off bed and heavy assist to lift trunk due to retropulsion; rolling for hygiene, with assist due to pt resistant  Transfers Overall transfer level: Needs assistance   Transfers: Sit to/from Stand Sit to Stand: +2 physical assistance;Total assist         General transfer comment: attempted several times to stand with assist lifting pt with pad under her and attempting to have her hold rail on Stedy, but unable to bring hips over feet and shoulders over hips due to posterior lean; unable to get safely to chair so returned to supine  Ambulation/Gait                  Stairs            Wheelchair Mobility    Modified Rankin (Stroke Patients Only)       Balance Overall balance assessment: Needs assistance   Sitting balance-Leahy Scale: Poor Sitting balance - Comments: initially posterior with max A for balance, then able to achieve sitting wtih min to mod A over about 8 minutes while engaging pt in brief moments of looking for items and discussing her fear of falling Postural control: Posterior lean Standing balance support: Bilateral upper extremity supported Standing balance-Leahy Scale: Zero Standing balance comment: leaning too much posterior for achieving upright                    Cognition Arousal/Alertness: Awake/alert Behavior During Therapy: Anxious Overall Cognitive Status: History of cognitive impairments - at baseline                      Exercises      General Comments General comments (skin integrity, edema, etc.): spouse in room and hired aide asking questions sent by daughter       Pertinent Vitals/Pain Pain Assessment: Faces Faces Pain Scale: Hurts a little bit Pain Location: legs with movement Pain Descriptors / Indicators: Grimacing Pain Intervention(s): Repositioned;Monitored during session    Home Living                      Prior Function  PT Goals (current goals can now be found in the care plan section) Progress towards PT goals: Progressing toward goals    Frequency  Min 3X/week    PT Plan Current plan remains appropriate    Co-evaluation             End of Session   Activity Tolerance: Patient limited by fatigue Patient left: in bed;with bed alarm set;with call bell/phone within reach;with family/visitor present     Time: 1030-1058 PT Time Calculation (min) (ACUTE ONLY): 28 min  Charges:  $Therapeutic Activity: 23-37 mins                    G CodesReginia Naas 2015-06-26, 12:56 PM  Magda Kiel, [place] Jun 26, 2015

## 2015-06-03 NOTE — Progress Notes (Signed)
[facility] RN Visit  This is a related, covered GIP admission from 05/28/15 to [facility] diagnosis of Alzheimer's Disease, per MD Monguilod. Code status is DNR. Patient seen in room with husband and caregiver, Rosanne Sack at bedside.  Patient alert, disoriented.  Patient unable to participate in meaningful conversation. She has bilateral mitten restraints to hands.  Rosanne Sack reports that patient was able to swallow her medications with applesauce and ate 100% of her breakfast this morning, being fed. He also reported that patient's daughter, [name] is currently looking into SNF's in the [facility] area for the patient to discharge for rehab.  Moderate amount of dark, yellow urine noted to patient's F/C. Patient has received 3 doses of 10 mg Oxycodone IR for pain in the past 24 hours, per chart review. IVF's have now been discontinued.  Patient appears comfortable, with no facial grimacing noted.  Family and caregiver deny any questions or concerns. [facility] will continue to follow daily.  Please call w/ any hospice-related questions or concerns.   Freddi Starr RN, [facility] Liaison [phone]

## 2015-06-03 NOTE — Progress Notes (Signed)
PROGRESS NOTE        PATIENT DETAILS Name: Alexa Hardin Age: 82 y.o. Sex: female Date of Birth: [birth year] Admit Date: 05/27/2015 Admitting Physician Edwin Dada, MD OJJ:[id number] F, MD Outpatient Specialists: Dr Burr Medico  Brief Narrative:  Patient is a 82 y.o. female with hx of breast cancer, stage 4 Melanoma, advanced dementia being followed by hospice at home, presented to the ED after a mechanical fall at [facility] she was for respite care. Further evaluation revealed left hip fracture, after d/w family, she underwent left hip repair on 5/11. Briefly febrile in the immediate postoperative course, without any obvious foci. Cultures continue to be negative, plans are to discharge back to SNF or back home with hospice in the next few days.   I took over her care on 06/02/2015 on day 5 of her hospital stay.  On 06/03/2015 husband and daughter addressed a lot of nursing grievances with me, I patiently talked to the husband for over 15 minutes in the room, daughter over the phone for over 25 minutes, got the nursing staff and nursing supervisor to the husband but he kept on talking non stop about nursing issues ( pt not being fed - not having regular bowel movements, functional status has declined).  Per daughter patient has very advanced dementia and she was on hospice before she came here, also has underlying malignancy, she had baseline cannot converse or make any sense when she talks, I told the family in detail that patient is frail, old and with very poor baseline to begin with. With her present illness functional decline is expected and it will take a few days to weeks for any recovery to happen, this will be a gradual process and her new baseline might be lower than previous.  Also explained to them that patient had a bowel movement this morning, she is already on bowel regimen, she has developed urinary retention for which she is on Flomax and Foley,  daughter requested for me to keep the patient here until she is back to her baseline, I informed her that she will be kept here until medically necessary and then she might have to be discharged to a SNF under monitored setting where monitoring can be provided as good as here. If she is medically not ready then of course she will be kept in the hospital. Husband also wanted to talk to hospital administration. Requested nursing staff to provide him with the details.  Subjective:  Calm but confused. Sleeping comfortably in bed. Occasionally follows commands. Afebrile for the past 24 hours  Assessment/Plan:   Closed subcapital fracture of neck of left femur: Secondary to a mechanical fall-patient very minimally ambulatory with very advanced dementia-post ORIF by Ortho- Dr Erlinda Hong on 5/11. Febrile post operatively however is now afebrile for the past 48 hours- UA/CXR negative for infection, blood cultures negative as well, some suspicion for microaspiration-  chest x-ray negative for any obvious infiltrates. Plan is to continue to monitor off antibiotics. Mild perioperative blood loss related anemia - monitor, Lovenox for DVT prophylaxis, WBAT per Ortho, will need SNF.   Dementia with behavioral disturbance:currently-pleasantly confused-occasionally follow blood s commands. Continue supportive care with Seroquel,prn Lorazepam  Dysphagia: Seen by speech therapy, diet has been downgraded to dysphagia 1 diet. Will advise family of risk of aspiration when they are at bedside.  Anemia: Mild perioperative  blood loss anemia-no indication of blood transfusion at present. Follow CBC  Hypothyroidism:continue synthroid  Hypertension: Blood pressure soft today-we will discontinue all antihypertensives (previously on atenolol and ferritin), continue IV fluids and monitor.  Hypokalemia:repleted  Hx of B/L Breast cancer/Stage 4 Melanoma: followed by Dr Ina Homes outpatient Oncology notes-ast CT scan on 03/07/2015  showed no evidence of disease.She was enrolled in hospice program in early 03/2015 due to her overall deconditioning and worsening fatigue and dementia   Ur Retention - Flomax, and Foley, bladder rest for 7-10 days. Then remove Foley.  Hypernatremia -resolved after gentle D5W.   DVT Prophylaxis: Lovenox - SCD's  Code Status: DNR  Family Communication: Husband bedside, daughter over the phone on 06/03/2015  Disposition Plan: SNF   Antimicrobial agents: None  Procedures: L Hip ORIF by Dr Erlinda Hong 05-31-15  CONSULTS:  orthopedic surgery  Time spent: 25- minutes-Greater than 50% of this time was spent in counseling, explanation of diagnosis, planning of further management, and coordination of care.  MEDICATIONS: Anti-infectives    Start     Dose/Rate Route Frequency Ordered Stop   05/30/15 2200  ceFAZolin (ANCEF) IVPB 2g/100 mL premix     2 g 200 mL/hr over 30 Minutes Intravenous Every 6 hours 05/30/15 2018 05/31/15 0819      Scheduled Meds: . acetaminophen  1,000 mg Oral TID  . docusate sodium  200 mg Oral BID  . enoxaparin (LOVENOX) injection  40 mg Subcutaneous Q24H  . feeding supplement (ENSURE ENLIVE)  237 mL Oral BID BM  . levothyroxine  137 mcg Oral QAC breakfast  . mirtazapine  15 mg Oral QHS  . potassium chloride  20 mEq Oral Daily  . QUEtiapine  25 mg Oral BID  . senna-docusate  2 tablet Oral BID  . tamsulosin  0.4 mg Oral Daily  . tranexamic acid (CYKLOKAPRON) topical -INTRAOP  2,000 mg Topical Once   Continuous Infusions:   PRN Meds:.alum & mag hydroxide-simeth, bisacodyl, hydrALAZINE, LORazepam, morphine injection, [DISCONTINUED] ondansetron **OR** ondansetron (ZOFRAN) IV, oxyCODONE, polyethylene glycol   PHYSICAL EXAM: Vital signs: Filed Vitals:   06/02/15 2109 06/03/15 0055 06/03/15 0521 06/03/15 0958  BP: 144/68 129/61 135/60 155/71  Pulse: 96 85 85 86  Temp: 98.3 F (36.8 C) 97.7 F (36.5 C) 98 F (36.7 C) 98.1 F (36.7 C)  TempSrc: Axillary  Axillary Oral Oral  Resp: '20 20 20 20  '$ Height:      Weight:      SpO2:  100% 100% 96%   Filed Weights   05/28/15 0433 06/01/15 1700  Weight: 51.211 kg (112 lb 14.4 oz) 51.2 kg (112 lb 14 oz)   Body mass index is 17.67 kg/(m^2).   Gen Exam: Awake, pleasantly confused.   Neck: Supple, No JVD.   Chest: B/L Clear.   CVS: S1 S2 Regular Abdomen: soft, BS +, non tender, non distended.  Extremities: no edema, lower extremities warm to touch. Neurologic: Moves all 4 ext-but difficult Exam Skin: No Rash or lesions, L hip scar site stable.   Wounds: N/A.    LABORATORY DATA: CBC:  Recent Labs Lab 05/27/15 2249 05/28/15 0835 05/30/15 2112 05/31/15 0409 06/01/15 0309 06/03/15 0343  WBC 8.5 9.6 8.9 7.6 6.2 7.0  NEUTROABS 7.1  --   --   --   --   --   HGB 11.6* 11.2* 10.3* 9.5* 9.0* 8.4*  HCT 36.7 36.7 33.1* 31.5* 28.6* 27.8*  MCV 81.0 81.0 80.0 80.2 81.9 81.3  PLT 327 293 223 218  188 601    Basic Metabolic Panel:  Recent Labs Lab 05/28/15 0835 05/30/15 2112 05/31/15 0409 06/01/15 0309 06/02/15 0435 06/03/15 0343  NA 145  --  140 143 148* 141  K 3.7  --  4.0 3.0* 4.0 3.7  CL 107  --  105 109 114* 109  CO2 27  --  '24 24 26 25  '$ GLUCOSE 97  --  107* 118* 99 131*  BUN 16  --  '12 13 15 11  '$ CREATININE 0.72 0.76 0.68 0.67 0.53 0.51  CALCIUM 8.9  --  7.9* 7.8* 8.2* 7.9*  MG 1.5*  --   --   --   --   --     GFR: Estimated Creatinine Clearance: 45.3 mL/min (by C-G formula based on Cr of 0.51).  Liver Function Tests: No results for input(s): AST, ALT, ALKPHOS, BILITOT, PROT, ALBUMIN in the last 168 hours. No results for input(s): LIPASE, AMYLASE in the last 168 hours. No results for input(s): AMMONIA in the last 168 hours.  Coagulation Profile:  Recent Labs Lab 05/27/15 2249  INR 1.04    Cardiac Enzymes: No results for input(s): CKTOTAL, CKMB, CKMBINDEX, TROPONINI in the last 168 hours.  BNP (last 3 results) No results for input(s): PROBNP in the last 8760  hours.  HbA1C: No results for input(s): HGBA1C in the last 72 hours.  CBG: No results for input(s): GLUCAP in the last 168 hours.  Lipid Profile: No results for input(s): CHOL, HDL, LDLCALC, TRIG, CHOLHDL, LDLDIRECT in the last 72 hours.  Thyroid Function Tests: No results for input(s): TSH, T4TOTAL, FREET4, T3FREE, THYROIDAB in the last 72 hours.  Anemia Panel: No results for input(s): VITAMINB12, FOLATE, FERRITIN, TIBC, IRON, RETICCTPCT in the last 72 hours.  Urine analysis:    Component Value Date/Time   COLORURINE YELLOW 05/31/2015 0755   APPEARANCEUR HAZY* 05/31/2015 0755   LABSPEC 1.028 05/31/2015 0755   PHURINE 5.5 05/31/2015 0755   GLUCOSEU NEGATIVE 05/31/2015 0755   HGBUR SMALL* 05/31/2015 0755   BILIRUBINUR NEGATIVE 05/31/2015 0755   KETONESUR 15* 05/31/2015 0755   PROTEINUR 30* 05/31/2015 0755   NITRITE NEGATIVE 05/31/2015 0755   LEUKOCYTESUR NEGATIVE 05/31/2015 0755    Sepsis Labs: Lactic Acid, Venous No results found for: LATICACIDVEN  MICROBIOLOGY: Recent Results (from the past 240 hour(s))  Surgical pcr screen     Status: None   Collection Time: 05/30/15  6:16 AM  Result Value Ref Range Status   MRSA, PCR NEGATIVE NEGATIVE Final   Staphylococcus aureus NEGATIVE NEGATIVE Final    Comment:        The Xpert SA Assay (FDA approved for NASAL specimens in patients over 31 years of age), is one component of a comprehensive surveillance program.  Test performance has been validated by [facility] for patients greater than or equal to 63 year old. It is not intended to diagnose infection nor to guide or monitor treatment.   Culture, blood (routine x 2)     Status: None (Preliminary result)   Collection Time: 05/31/15 12:00 PM  Result Value Ref Range Status   Specimen Description BLOOD LEFT ANTECUBITAL  Final   Special Requests IN PEDIATRIC BOTTLE 3CC  Final   Culture NO GROWTH 2 DAYS  Final   Report Status PENDING  Incomplete  Culture, blood  (routine x 2)     Status: None (Preliminary result)   Collection Time: 05/31/15 12:08 PM  Result Value Ref Range Status   Specimen Description BLOOD BLOOD  LEFT HAND  Final   Special Requests IN PEDIATRIC BOTTLE 5ML  Final   Culture NO GROWTH 2 DAYS  Final   Report Status PENDING  Incomplete    RADIOLOGY STUDIES/RESULTS:  Dg Chest 1 View  05/27/2015  CLINICAL DATA:  Left hip fracture. Malignant melanoma in bilateral breast carcinoma. Pre-op respiratory exam EXAM: CHEST 1 VIEW COMPARISON:  03/17/2015 FINDINGS: Right-sided Port-A-Cath remains in appropriate position. Mild cardiomegaly is stable. Pulmonary hyperinflation is seen, consistent with COPD. No evidence of acute infiltrate or pleural effusion. IMPRESSION: Stable COPD and mild cardiomegaly.  No acute findings. Electronically Signed   By: Earle Gell M.D.   On: 05/27/2015 22:48   Ct Head Wo Contrast  05/28/2015  CLINICAL DATA:  Initial valuation for acute trauma, fall. EXAM: CT HEAD WITHOUT CONTRAST TECHNIQUE: Contiguous axial images were obtained from the base of the skull through the vertex without intravenous contrast. COMPARISON:  Prior study from 03/17/2015. FINDINGS: Diffuse prominence of the CSF containing spaces is compatible with generalized age-related cerebral atrophy. Patchy hypodensity within the periventricular and deep white matter most compatible with chronic small vessel ischemic disease. Vascular calcifications within the carotid siphons. Overall, these changes are stable. No acute intracranial hemorrhage. No acute large vessel territory infarct. No mass lesion, midline shift, or mass effect. Ventricular prominence related to global parenchymal volume loss present, stable. No hydrocephalus. No extra-axial fluid collection. Scalp soft tissues demonstrate no acute abnormality. No acute abnormality about the orbits. Paranasal sinuses are clear.  No mastoid effusion. Calvarium intact. IMPRESSION: 1. No acute intracranial process. 2.  Moderate diffuse atrophy with chronic small vessel ischemic disease, stable. Electronically Signed   By: Jeannine Boga M.D.   On: 05/28/2015 01:25   Pelvis Portable  05/30/2015  CLINICAL DATA:  Postop from left hip arthroplasty. Left femoral neck fracture. EXAM: PORTABLE PELVIS 1-2 VIEWS COMPARISON:  05/27/2015 FINDINGS: A left hip prosthesis is now seen in expected position with overlying skin staples. There is no evidence of fracture or dislocation. Previously demonstrated right hip prosthesis is unchanged. Generalized osteopenia noted. IMPRESSION: Expected postoperative appearance of left hip prosthesis. No complication identified. Electronically Signed   By: Earle Gell M.D.   On: 05/30/2015 19:51   Dg Chest Port 1 View  05/31/2015  CLINICAL DATA:  Shortness of breath EXAM: PORTABLE CHEST 1 VIEW COMPARISON:  05/27/2015 FINDINGS: Chronic hyperinflation. Postoperative changes on the left with volume loss and scarring. Stable right apical thickening that is scar based on recent chest CT. Enlarged appearance of the heart, accentuated by postoperative distortion. Stable cardiomediastinal contours. IMPRESSION: 1. Stable.  No evidence of acute disease. 2. COPD and postoperative changes. Electronically Signed   By: Monte Fantasia M.D.   On: 05/31/2015 08:20   Dg Hip Operative Unilat W Or W/o Pelvis Left  05/30/2015  CLINICAL DATA:  Status post total hip replacement EXAM: OPERATIVE LEFT HIP   1 VIEW TECHNIQUE: Fluoroscopic spot image(s) were submitted for interpretation post-operatively. COMPARISON:  May a 2017 FLUOROSCOPY TIME:  0 minutes 22 seconds; 3 acquired images FINDINGS: Frontal view obtained. There is a total hip replacement on the left with prosthetic component well-seated on this single view. No fracture or dislocation evident. Total hip replacement also noted on the right. IMPRESSION: Left total hip prosthesis well-seated on frontal view. No demonstrable fracture or dislocation.  Electronically Signed   By: Lowella Grip III M.D.   On: 05/30/2015 18:35   Dg Hip Unilat With Pelvis 2-3 Views Left  05/27/2015  CLINICAL  DATA:  Left hip injury and pain after jumping out of wheelchair. Initial encounter. EXAM: DG HIP (WITH OR WITHOUT PELVIS) 2-3V LEFT COMPARISON:  None. FINDINGS: A subcapital left femoral neck fracture is seen. No evidence of dislocation. Generalized osteopenia noted. No pelvic fracture identified. Bipolar right hip prosthesis noted as well as generalized osteopenia. IMPRESSION: Subcapital left femoral neck fracture. Diffuse osteopenia. Electronically Signed   By: Earle Gell M.D.   On: 05/27/2015 22:46     LOS: 6 days   Thurnell Lose, MD  [facility] Pager:[phone]  If 7PM-7AM, please contact night-coverage [email or web address] Password [email or web address] 06/03/2015, 11:20 AM

## 2015-06-03 NOTE — Progress Notes (Signed)
[facility] MSW note: This is a hospice related admission. Pt is a DNR. Met with Patient, spouse-Ralph, and daughter-Pamela. Pt was awake during visit. Pt talked in a soft tone. Pt was more quite than she was prior to hospitalization. Pt was confused during visit. Pt appeared comfortable during visit. Cedric Fishman, hospital social worker arrived during visit. MSW discussed hospital discharge plans with family. Family has decided to take patient home with continued hospice care after hospital discharge. Family discussed how they feel pt will get better care at home due to pt's new baseline, Daughter does not feel physical therapy will not be beneficial at skilled nursing facility for pt. Pt already has hosptial bed and bedside table at home. It pt will oxygen at home then oxygen equipment will be ordered for home. MSW provided support to family and answered their questions about return home. MSW discussed case with [place], social worker; Steffanie Dunn, Therapist, sports; and [place], [employer] RN liaison.   Marilynne Halsted, MSW

## 2015-06-03 NOTE — Care Management Note (Signed)
Case Management Note  Patient Details  Name: Alexa Hardin MRN: [id number] Date of Birth: [birth year]  Subjective/Objective:                    Action/Plan: Plan is SNF with hospice vs home with hospice. CM following for discharge needs.   Expected Discharge Date:                  Expected Discharge Plan:     In-House Referral:     Discharge planning Services     Post Acute Care Choice:    Choice offered to:     DME Arranged:    DME Agency:     HH Arranged:    HH Agency:     Status of Service:  In process, will continue to follow  Medicare Important Message Given:    Date Medicare IM Given:    Medicare IM give by:    Date Additional Medicare IM Given:    Additional Medicare Important Message give by:     If discussed at [place] of Stay Meetings, dates discussed:    Additional Comments:  Pollie Friar, RN 06/03/2015, 11:58 AM

## 2015-06-03 NOTE — Progress Notes (Signed)
CSW spoke with patient's daughter, spouse, and Hospice Liason Jackelyn Poling) at bedside to discuss care plan. Patient's family would like to take patient home with hospice services and believe patient would receive better care and attention at home.  CSW signing off.  Percell Locus Kailen Hinkle LCSWA [phone]

## 2015-06-03 NOTE — Progress Notes (Signed)
Speech Language Pathology Treatment: Dysphagia  Patient Details Name: Alexa Hardin MRN: [id number] DOB: 1932/04/24 Today's Date: 06/03/2015 Time: 5397-6734 SLP Time Calculation (min) (ACUTE ONLY): 24 min  Assessment / Plan / Recommendation Clinical Impression  Pt's intake was very limited this session due to impaired sustained attention and bolus awareness. Tiny amounts of thin liquid and puree accepted off of spoon and two ice chips consumed. Pt's spouse and caregiver report better intake at breakfast with oral holding but without overt signs of aspiration. Provided education about cognitively-based dysphagia. Will continue to monitor.   HPI HPI: 82 y.o. female with a past medical history significant for severe dementia, HTN, hx of CVA who presents with fall and LEFT hip fracture. Per chart patient currently enrolled in Hospice.Chart reports pt has history of metastatic melanoma to lung s/p resection and last PET scan without recurrence, however, has had progression of dementia, and in January was admitted for pneumonia, but since then has had a decline in functional status now wheelchair bound, profoundly demented, and enrolled in Hospice. CXR Stable. No evidence of acute disease, COPD and postoperative changes. BSE 01/2015 recommended regular diet and thin liquids with baseline intermittent cough.      SLP Plan  Continue with current plan of care     Recommendations  Diet recommendations: Thin liquid;Dysphagia 1 (puree) Liquids provided via: Cup Medication Administration: Crushed with puree Supervision: Staff to assist with self feeding;Full supervision/cueing for compensatory strategies Compensations: Minimize environmental distractions;Slow rate;Small sips/bites Postural Changes and/or Swallow Maneuvers: Seated upright 90 degrees;Upright 30-60 min after meal             Oral Care Recommendations: Oral care BID Follow up Recommendations: 24 hour supervision/assistance Plan:  Continue with current plan of care     GO               Germain Osgood, M.A. CCC-SLP [phone]  Germain Osgood 06/03/2015, 11:58 AM

## 2015-06-03 NOTE — Progress Notes (Addendum)
Nutrition Follow-up  DOCUMENTATION CODES:   Underweight  INTERVENTION:  Continue Ensure Enlive po BID, each supplement provides 350 kcal and 20 grams of protein Continue Magic Cup TID with meals, each supplement provides 290 kcal and 9 grams of protein Provided and discussed "Suggestions for Increasing Calories and Protein", "High Calorie, High Protein Recipes", and "Dysphagia Level 1: Pureed Foods" handouts from the Academy of [employer] and Dietetics with patient's husband   NUTRITION DIAGNOSIS:   Predicted suboptimal nutrient intake related to lethargy/confusion, acute illness as evidenced by NPO status, moderate depletions of muscle mass.  Ongoing  GOAL:   Patient will meet greater than or equal to 90% of their needs  Unmet  MONITOR:   Diet advancement, PO intake, Supplement acceptance, Labs, Weight trends, Skin, I & O's  REASON FOR ASSESSMENT:   Consult Hip fracture protocol  ASSESSMENT:   82 y.o. female with a past medical history significant for severe dementia, HTN, hx of CVA who presents with fall and LEFT hip fracture.  RD met with pt's husband at bedside. He reports that pt had been clenching teeth and eating very little, but her intake has improved the past couple days with pt eating 95% of dinner last night and 100% of breakfast this morning. Husband reports plans to take patient home and asking about pureed foods diet. RD provided and discussed handouts as listed above regarding appropriate foods and textures.   Labs: low calcium, low hemoglobin  Diet Order:  DIET - DYS 1 Room service appropriate?: Yes; Fluid consistency:: Thin  Skin:  Wound (see comment) (closed incision on L hip)  Last BM:  5/15  Height:   Ht Readings from Last 1 Encounters:  06/01/15 _0  (1.702 m)    Weight:   Wt Readings from Last 1 Encounters:  06/01/15 112 lb 14 oz (51.2 kg)    Ideal Body Weight:  65.9 kg  BMI:  Body mass index is 17.67 kg/(m^2).  Estimated  Nutritional Needs:   Kcal:  1500-1700  Protein:  70-80 grams  Fluid:  >/= 1.5 L/day  EDUCATION NEEDS:   No education needs identified at this time  [place], LDN Inpatient Clinical Dietitian Pager: [phone] After Hours Pager: [phone]

## 2015-06-04 MED ORDER — METOPROLOL TARTRATE 50 MG PO TABS: 50.0000 mg | ORAL_TABLET | Freq: Two times a day (BID) | ORAL | Status: DC

## 2015-06-04 NOTE — Care Management Note (Signed)
Case Management Note  Patient Details  Name: Alexa Hardin MRN: [id number] Date of Birth: 1932-06-17  Subjective/Objective:                    Action/Plan: Patient discharging home with hospice services. Patient was already active with Hospice and [facility] prior to admission. CM called and spoke with Nelson Chimes patients daughter about any needs for home. She had some concerns about home lovenox, transportation home, and transportation to future appointments. Per [name] [name] has given the patient lovenox shots in the past he is just concerned with her confusion it may be more difficult. CM called Stacey with [facility] and informed her of the daughters concerns. Erline Levine said they will have a RN go over the shots at home with the patients husband and support them as needed. She also stated that the SW for [facility] will arrange for transportation to the future appointments. Erline Levine stated she would call and talk to [name] and go over her concerns. CM will arrange transportation home from the hospital per PTAR. [name] aware of transportation home plans and CM will speak to [name] when he arrives to the hospital. Will update the bedside RN.  Expected Discharge Date:                  Expected Discharge Plan:  Home w Hospice Care  In-House Referral:  Clinical Social Work  Discharge planning Services  CM Consult  Post Acute Care Choice:    Choice offered to:     DME Arranged:    DME Agency:     HH Arranged:    [place] Agency:  [facility]  Status of Service:  Completed, signed off  Medicare Important Message Given:    Date Medicare IM Given:    Medicare IM give by:    Date Additional Medicare IM Given:    Additional Medicare Important Message give by:     If discussed at [place] of Stay Meetings, dates discussed:    Additional Comments:  Pollie Friar, RN 06/04/2015, 11:25 AM

## 2015-06-04 NOTE — Discharge Summary (Addendum)
Alexa Hardin, is a 82 y.o. female  DOB 14-Mar-1932  MRN [id number].  Admission date:  05/27/2015  Admitting Physician  Edwin Dada, MD  Discharge Date:  06/04/2015   Primary MD  Marylene Land, MD  Recommendations for primary care physician for things to follow:   Being discharged with home hospice or family request. Goal of care is comfort   Admission Diagnosis  Hypokalemia [E87.6] Elevated blood pressure [R03.0] Closed subcapital fracture of neck of left femur, initial encounter ([place]) [S72.012A]   Discharge Diagnosis  Hypokalemia [E87.6] Elevated blood pressure [R03.0] Closed subcapital fracture of neck of left femur, initial encounter ([place]) [S72.012A]     Principal Problem:   Closed subcapital fracture of neck of left femur ([place]) Active Problems:   Dementia with behavioral disturbance   Hypothyroidism   Hypertension   Hypokalemia   Closed subcapital fracture of neck of femur ([place])      Past Medical History  Diagnosis Date  . Hypertension   . Cancer ([place])     Breast/melanoma  . Dementia   . Alzheimer disease   . Unspecified protein-calorie malnutrition ([place])   . Thyroid disease   . Hyperlipemia     Past Surgical History  Procedure Laterality Date  . Lung removal, partial Left 2013  . Mastectomy Bilateral 2013  . Abdominal wall mass resection  06/15/2012  . Anterior approach hemi hip arthroplasty Left 05/30/2015    Procedure: ANTERIOR APPROACH LEFT HIP HEMIARTHROPLASTY;  Surgeon: Leandrew Koyanagi, MD;  Location: [facility];  Service: Orthopedics;  Laterality: Left;       HPI  from the history and physical done on the day of admission:    Patient is a 82 y.o. female with hx of breast cancer, stage 4 Melanoma, advanced dementia being followed by hospice at home, presented to the ED after  a mechanical fall at [facility] she was for respite care. Further evaluation revealed left hip fracture, after d/w family, she underwent left hip repair on 5/11. Briefly febrile in the immediate postoperative course, without any obvious foci. Cultures continue to be negative, plans are to discharge back to SNF or back home with hospice in the next few days.   I took over her care on 06/02/2015 on day 5 of her hospital stay.  See yesterdays note for details.  Patient's daughter and husband have now decided that they want to take her home with hospice. [facility] and husband said she got very poor care there.     Hospital Course:     Closed subcapital fracture of neck of left femur: Secondary to a mechanical fall-patient very minimally ambulatory with very advanced dementia-post ORIF by Ortho- Dr Erlinda Hong on 5/11. [place] family wanted conservative treatment and no surgery but later they agreed.   Mild perioperative blood loss related anemia - monitor, Lovenox for DVT prophylaxis, WBAT per Ortho, she qualified for SNF but husband and daughter decided that they will take her home with home hospice.  Dementia with behavioral  disturbance:currently-pleasantly confused-occasionally follow blood s commands. According to the daughter her baseline is pretty poor and she cannot converse at baseline, supportive care. Now being discharged with home hospice.  Dysphagia: Seen by speech therapy, diet has been downgraded to dysphagia 1 diet. [place] family to continue feeding assistance and aspiration precautions.  Anemia: Mild perioperative blood loss anemia-no indication of blood transfusion at present. Follow CBC  Hypothyroidism:continue synthroid  Hypertension: Continue home regimen.  Hypokalemia:repleted  Hx of B/L Breast cancer/Stage 4 Melanoma: followed by Dr Ina Homes outpatient Oncology notes-ast CT scan on 03/07/2015 showed no evidence of disease.She was enrolled in hospice  program in early 03/2015 due to her overall deconditioning and worsening fatigue and dementia   Ur Retention - Flomax, and Foley, bladder rest for 7-10 days. Then remove Foley, If fails reinsert Foley with outpatient urology follow-up.  Hypernatremia -resolved after gentle D5W.     Follow UP  Follow-up Information    Follow up with Marianna Payment, MD In 2 weeks.   Specialty:  Orthopedic Surgery   Why:  For suture removal, For wound re-check   Contact information:   [place] [id number] [phone]       Follow up with Marylene Land, MD. Schedule an appointment as soon as possible for a visit in 1 week.   Specialty:  Family Medicine   Contact information:   [place] [address] [phone]       Follow up with [facility]. Schedule an appointment as soon as possible for a visit in 1 week.   Contact information:   [address] [place] [phone]       Consults obtained [facility]  Discharge Condition: guarded   Diet and Activity recommendation: See Discharge Instructions below  Discharge Instructions           Discharge Instructions    Discharge instructions    Complete by:  As directed   Follow with Primary MD Marylene Land, MD in 7 days   Get CBC, CMP, 2 view Chest X ray checked  by Primary MD next visit.    Activity: As tolerated with Full fall precautions use walker/cane & assistance as needed   Disposition Home with hospice   Diet:   Dysphagia 1 diet with feeding assistance and aspiration precautions.  For Heart failure patients - Check your Weight same time everyday, if you gain over 2 pounds, or you develop in leg swelling, experience more shortness of breath or chest pain, call your Primary MD immediately. Follow Cardiac Low Salt Diet and 1.5 lit/day fluid restriction.   On your next visit with your primary care physician please Get  Medicines reviewed and adjusted.   Please request your Prim.MD to go over all Hospital Tests and Procedure/Radiological results at the follow up, please get all Hospital records sent to your Prim MD by signing hospital release before you go home.   If you experience worsening of your admission symptoms, develop shortness of breath, life threatening emergency, suicidal or homicidal thoughts you must seek medical attention immediately by calling 911 or calling your MD immediately  if symptoms less severe.  You Must read complete instructions/literature along with all the possible adverse reactions/side effects for all the Medicines you take and that have been prescribed to you. Take any new Medicines after you have completely understood and accpet all the possible adverse reactions/side effects.   Do not drive, operating  heavy machinery, perform activities at heights, swimming or participation in water activities or provide baby sitting services if your were admitted for syncope or siezures until you have seen by Primary MD or a Neurologist and advised to do so again.  Do not drive when taking Pain medications.    Do not take more than prescribed Pain, Sleep and Anxiety Medications  Special Instructions: If you have smoked or chewed Tobacco  in the last 2 yrs please stop smoking, stop any regular Alcohol  and or any Recreational drug use.  Wear Seat belts while driving.   Please note  You were cared for by a hospitalist during your hospital stay. If you have any questions about your discharge medications or the care you received while you were in the hospital after you are discharged, you can call the unit and asked to speak with the hospitalist on call if the hospitalist that took care of you is not available. Once you are discharged, your primary care physician will handle any further medical issues. Please note that NO REFILLS for any discharge medications will be authorized once you are  discharged, as it is imperative that you return to your primary care physician (or establish a relationship with a primary care physician if you do not have one) for your aftercare needs so that they can reassess your need for medications and monitor your lab values.     Increase activity slowly    Complete by:  As directed      Weight bearing as tolerated    Complete by:  As directed              Discharge Medications       Medication List    TAKE these medications        aspirin 81 MG tablet  Take 81 mg by mouth daily.     atenolol 50 MG tablet  Commonly known as:  TENORMIN  Take 50 mg by mouth daily.     atenolol 25 MG tablet  Commonly known as:  TENORMIN  Take 25 mg by mouth every evening.     bisacodyl 10 MG suppository  Commonly known as:  DULCOLAX  Place 10 mg rectally as needed for moderate constipation.     enoxaparin 40 MG/0.4ML injection  Commonly known as:  LOVENOX  Inject 0.4 mLs (40 mg total) into the skin daily.     feeding supplement (ENSURE ENLIVE) Liqd  Take 237 mLs by mouth 2 (two) times daily between meals.     HYDROcodone-acetaminophen 7.5-325 MG tablet  Commonly known as:  NORCO  Take 1 tablet by mouth every 6 (six) hours as needed for moderate pain.     letrozole 2.5 MG tablet  Commonly known as:  FEMARA  Take 1 tablet (2.5 mg total) by mouth daily.     levothyroxine 137 MCG tablet  Commonly known as:  SYNTHROID, LEVOTHROID  Take 137 mcg by mouth daily before breakfast. Reported on 05/10/2015     LORazepam 0.5 MG tablet  Commonly known as:  ATIVAN  Take 1 tablet (0.5 mg total) by mouth every 8 (eight) hours as needed for anxiety.     magnesium hydroxide 400 MG/5ML suspension  Commonly known as:  MILK OF MAGNESIA  Take 30 mLs by mouth daily as needed for mild constipation.     megestrol 625 MG/5ML suspension  Commonly known as:  MEGACE ES  Take 5 mLs (625 mg total) by mouth daily.  mirtazapine 15 MG tablet  Commonly known as:   REMERON  TAKE 1 TABLET BY MOUTH AT BEDTIME     NIFEdipine 30 MG 24 hr tablet  Commonly known as:  PROCARDIA-XL/ADALAT-CC/NIFEDICAL-XL  Take 30 mg by mouth daily.     potassium chloride 20 MEQ packet  Commonly known as:  KLOR-CON  Take 20 mEq by mouth daily.     PROBIOTIC PO  Take 1 capsule by mouth daily.     QUEtiapine 25 MG tablet  Commonly known as:  SEROQUEL  Take 25 mg by mouth 2 (two) times daily.     RA SALINE ENEMA 19-7 GM/118ML Enem  Place 1 each rectally as needed (for constipation).     SENEXON-S 8.6-50 MG tablet  Generic drug:  senna-docusate  Take 2 tablets by mouth 2 (two) times daily.     sorbitol 70 % solution  Take 30 mLs by mouth at bedtime.     tamsulosin 0.4 MG Caps capsule  Commonly known as:  FLOMAX  Take 1 capsule (0.4 mg total) by mouth daily.     Vitamin D3 1000 units Caps  Take 1 capsule by mouth daily.        Major procedures and Radiology Reports - PLEASE review detailed and final reports for all details, in brief -       Dg Chest 1 View  05/27/2015  CLINICAL DATA:  Left hip fracture. Malignant melanoma in bilateral breast carcinoma. Pre-op respiratory exam EXAM: CHEST 1 VIEW COMPARISON:  03/17/2015 FINDINGS: Right-sided Port-A-Cath remains in appropriate position. Mild cardiomegaly is stable. Pulmonary hyperinflation is seen, consistent with COPD. No evidence of acute infiltrate or pleural effusion. IMPRESSION: Stable COPD and mild cardiomegaly.  No acute findings. Electronically Signed   By: Earle Gell M.D.   On: 05/27/2015 22:48   Ct Head Wo Contrast  05/28/2015  CLINICAL DATA:  Initial valuation for acute trauma, fall. EXAM: CT HEAD WITHOUT CONTRAST TECHNIQUE: Contiguous axial images were obtained from the base of the skull through the vertex without intravenous contrast. COMPARISON:  Prior study from 03/17/2015. FINDINGS: Diffuse prominence of the CSF containing spaces is compatible with generalized age-related cerebral atrophy. Patchy  hypodensity within the periventricular and deep white matter most compatible with chronic small vessel ischemic disease. Vascular calcifications within the carotid siphons. Overall, these changes are stable. No acute intracranial hemorrhage. No acute large vessel territory infarct. No mass lesion, midline shift, or mass effect. Ventricular prominence related to global parenchymal volume loss present, stable. No hydrocephalus. No extra-axial fluid collection. Scalp soft tissues demonstrate no acute abnormality. No acute abnormality about the orbits. Paranasal sinuses are clear.  No mastoid effusion. Calvarium intact. IMPRESSION: 1. No acute intracranial process. 2. Moderate diffuse atrophy with chronic small vessel ischemic disease, stable. Electronically Signed   By: Jeannine Boga M.D.   On: 05/28/2015 01:25   Pelvis Portable  05/30/2015  CLINICAL DATA:  Postop from left hip arthroplasty. Left femoral neck fracture. EXAM: PORTABLE PELVIS 1-2 VIEWS COMPARISON:  05/27/2015 FINDINGS: A left hip prosthesis is now seen in expected position with overlying skin staples. There is no evidence of fracture or dislocation. Previously demonstrated right hip prosthesis is unchanged. Generalized osteopenia noted. IMPRESSION: Expected postoperative appearance of left hip prosthesis. No complication identified. Electronically Signed   By: Earle Gell M.D.   On: 05/30/2015 19:51   Dg Chest Port 1 View  05/31/2015  CLINICAL DATA:  Shortness of breath EXAM: PORTABLE CHEST 1 VIEW COMPARISON:  05/27/2015 FINDINGS: Chronic  hyperinflation. Postoperative changes on the left with volume loss and scarring. Stable right apical thickening that is scar based on recent chest CT. Enlarged appearance of the heart, accentuated by postoperative distortion. Stable cardiomediastinal contours. IMPRESSION: 1. Stable.  No evidence of acute disease. 2. COPD and postoperative changes. Electronically Signed   By: Monte Fantasia M.D.   On:  05/31/2015 08:20   Dg Hip Operative Unilat W Or W/o Pelvis Left  05/30/2015  CLINICAL DATA:  Status post total hip replacement EXAM: OPERATIVE LEFT HIP   1 VIEW TECHNIQUE: Fluoroscopic spot image(s) were submitted for interpretation post-operatively. COMPARISON:  May a 2017 FLUOROSCOPY TIME:  0 minutes 22 seconds; 3 acquired images FINDINGS: Frontal view obtained. There is a total hip replacement on the left with prosthetic component well-seated on this single view. No fracture or dislocation evident. Total hip replacement also noted on the right. IMPRESSION: Left total hip prosthesis well-seated on frontal view. No demonstrable fracture or dislocation. Electronically Signed   By: Lowella Grip III M.D.   On: 05/30/2015 18:35   Dg Hip Unilat With Pelvis 2-3 Views Left  05/27/2015  CLINICAL DATA:  Left hip injury and pain after jumping out of wheelchair. Initial encounter. EXAM: DG HIP (WITH OR WITHOUT PELVIS) 2-3V LEFT COMPARISON:  None. FINDINGS: A subcapital left femoral neck fracture is seen. No evidence of dislocation. Generalized osteopenia noted. No pelvic fracture identified. Bipolar right hip prosthesis noted as well as generalized osteopenia. IMPRESSION: Subcapital left femoral neck fracture. Diffuse osteopenia. Electronically Signed   By: Earle Gell M.D.   On: 05/27/2015 22:46    Micro Results      Recent Results (from the past 240 hour(s))  Surgical pcr screen     Status: None   Collection Time: 05/30/15  6:16 AM  Result Value Ref Range Status   MRSA, PCR NEGATIVE NEGATIVE Final   Staphylococcus aureus NEGATIVE NEGATIVE Final    Comment:        The Xpert SA Assay (FDA approved for NASAL specimens in patients over 8 years of age), is one component of a comprehensive surveillance program.  Test performance has been validated by [facility] for patients greater than or equal to 45 year old. It is not intended to diagnose infection nor to guide or monitor treatment.     Culture, blood (routine x 2)     Status: None (Preliminary result)   Collection Time: 05/31/15 12:00 PM  Result Value Ref Range Status   Specimen Description BLOOD LEFT ANTECUBITAL  Final   Special Requests IN PEDIATRIC BOTTLE 3CC  Final   Culture NO GROWTH 2 DAYS  Final   Report Status PENDING  Incomplete  Culture, blood (routine x 2)     Status: None (Preliminary result)   Collection Time: 05/31/15 12:08 PM  Result Value Ref Range Status   Specimen Description BLOOD BLOOD LEFT HAND  Final   Special Requests IN PEDIATRIC BOTTLE 5ML  Final   Culture NO GROWTH 2 DAYS  Final   Report Status PENDING  Incomplete       Today   Subjective    Suvi Archuletta today has no headache,no chest abdominal pain,no new weakness tingling or numbness, feels much better , unreliable historian.   Objective   Blood pressure 144/73, pulse 82, temperature 98.6 F (37 C), temperature source Oral, resp. rate 20, height '5\' 7"'$  (1.702 m), weight 51.2 kg (112 lb 14 oz), SpO2 98 %.   Intake/Output Summary (Last 24 hours) at  06/04/15 1059 Last data filed at 06/04/15 0900  Gross per 24 hour  Intake    260 ml  Output   1200 ml  Net   -940 ml    Exam  Awake, Pleasantly confused, No new F.N deficits, Normal affect [place].AT,PERRAL Supple Neck,No JVD, No cervical lymphadenopathy appriciated.  Symmetrical Chest wall movement, Good air movement bilaterally, CTAB RRR,No Gallops,Rubs or new Murmurs, No Parasternal Heave +ve B.Sounds, Abd Soft, Non tender, No organomegaly appriciated, No rebound -guarding or rigidity. No Cyanosis, Clubbing or edema, No new Rash or bruise L. Hip scar stable   Data Review   CBC w Diff:  Lab Results  Component Value Date   WBC 7.0 06/03/2015   WBC 7.3 03/26/2015   HGB 8.4* 06/03/2015   HGB 11.7 03/26/2015   HCT 27.8* 06/03/2015   HCT 36.3 03/26/2015   PLT 255 06/03/2015   PLT 336 03/26/2015   LYMPHOPCT 9 05/27/2015   LYMPHOPCT 14.4 03/26/2015   MONOPCT 6  05/27/2015   MONOPCT 10.9 03/26/2015   EOSPCT 1 05/27/2015   EOSPCT 1.4 03/26/2015   BASOPCT 0 05/27/2015   BASOPCT 0.3 03/26/2015    CMP:  Lab Results  Component Value Date   NA 141 06/03/2015   NA 141 03/26/2015   K 3.7 06/03/2015   K 3.9 03/26/2015   CL 109 06/03/2015   CO2 25 06/03/2015   CO2 26 03/26/2015   BUN 11 06/03/2015   BUN 20.1 03/26/2015   CREATININE 0.51 06/03/2015   CREATININE 0.8 03/26/2015   PROT 7.0 03/26/2015   PROT 7.5 03/17/2015   ALBUMIN 3.1* 03/26/2015   ALBUMIN 3.6 03/17/2015   BILITOT 0.54 03/26/2015   BILITOT 0.7 03/17/2015   ALKPHOS 68 03/26/2015   ALKPHOS 54 03/17/2015   AST 21 03/26/2015   AST 24 03/17/2015   ALT 10 03/26/2015   ALT 14 03/17/2015  .   Total Time in preparing paper work, data evaluation and todays exam - 35 minutes  Thurnell Lose M.D on 06/04/2015 at [place]  [phone]

## 2015-06-04 NOTE — Discharge Instructions (Signed)
° ° °  1. Change dressings as needed 2. May shower but keep incisions covered and dry 3. Take lovenox to prevent blood clots 4. Take stool softeners as needed 5. Take pain meds as needed   Follow with Primary MD Marylene Land, MD in 7 days   Get CBC, CMP, 2 view Chest X ray checked  by Primary MD next visit.    Activity: As tolerated with Full fall precautions use walker/cane & assistance as needed   Disposition Home with hospice   Diet:   Dysphagia 1 diet with feeding assistance and aspiration precautions.  For Heart failure patients - Check your Weight same time everyday, if you gain over 2 pounds, or you develop in leg swelling, experience more shortness of breath or chest pain, call your Primary MD immediately. Follow Cardiac Low Salt Diet and 1.5 lit/day fluid restriction.   On your next visit with your primary care physician please Get Medicines reviewed and adjusted.   Please request your Prim.MD to go over all Hospital Tests and Procedure/Radiological results at the follow up, please get all Hospital records sent to your Prim MD by signing hospital release before you go home.   If you experience worsening of your admission symptoms, develop shortness of breath, life threatening emergency, suicidal or homicidal thoughts you must seek medical attention immediately by calling 911 or calling your MD immediately  if symptoms less severe.  You Must read complete instructions/literature along with all the possible adverse reactions/side effects for all the Medicines you take and that have been prescribed to you. Take any new Medicines after you have completely understood and accpet all the possible adverse reactions/side effects.   Do not drive, operating heavy machinery, perform activities at heights, swimming or participation in water activities or provide baby sitting services if your were admitted for syncope or siezures until you have seen by Primary MD or a Neurologist and  advised to do so again.  Do not drive when taking Pain medications.    Do not take more than prescribed Pain, Sleep and Anxiety Medications  Special Instructions: If you have smoked or chewed Tobacco  in the last 2 yrs please stop smoking, stop any regular Alcohol  and or any Recreational drug use.  Wear Seat belts while driving.   Please note  You were cared for by a hospitalist during your hospital stay. If you have any questions about your discharge medications or the care you received while you were in the hospital after you are discharged, you can call the unit and asked to speak with the hospitalist on call if the hospitalist that took care of you is not available. Once you are discharged, your primary care physician will handle any further medical issues. Please note that NO REFILLS for any discharge medications will be authorized once you are discharged, as it is imperative that you return to your primary care physician (or establish a relationship with a primary care physician if you do not have one) for your aftercare needs so that they can reassess your need for medications and monitor your lab values.

## 2015-06-04 NOTE — Progress Notes (Signed)
CM spoke with patients husband and has arranged transportation home via [place]. Bedside RN updated.

## 2015-06-05 LAB — CULTURE, BLOOD (ROUTINE X 2)
CULTURE: NO GROWTH
Culture: NO GROWTH

## 2015-06-14 ENCOUNTER — Telehealth: Payer: Self-pay | Admitting: Hematology

## 2015-06-14 NOTE — Telephone Encounter (Signed)
Mailed pt medical records to [place]. 06/14/15 Too many to faxe.

## 2015-07-11 ENCOUNTER — Other Ambulatory Visit: Payer: Self-pay | Admitting: Nurse Practitioner

## 2015-07-20 DEATH — deceased

## 2015-09-04 ENCOUNTER — Ambulatory Visit: Payer: Medicare HMO | Admitting: Nurse Practitioner

## 2015-10-07 ENCOUNTER — Telehealth: Payer: Self-pay | Admitting: *Deleted

## 2015-10-07 NOTE — Telephone Encounter (Signed)
"  This is Alexa Hardin calling about an  [place] and Disability form I need completed.  My wife died 23-Jul-2015, don't her records show she is deceased?  We paid for this a long time and Hospice said it should be signed by the oncology nurse.  Who do I leave this with."  Advised leave at front desk for the Managed Care Department.  "You're not giving out the person's name."  Advised staff is working together and will work to completion within three to five working days.  Will notify H.I.M.

## 2015-10-15 ENCOUNTER — Telehealth: Payer: Self-pay

## 2015-10-15 NOTE — Telephone Encounter (Signed)
Faxed ltc benefits form to [place]

## 2016-01-07 ENCOUNTER — Telehealth: Payer: Self-pay | Admitting: Hematology

## 2016-01-07 NOTE — Telephone Encounter (Signed)
Mailed records to [facility]/ risk adjustment

## 2016-05-01 IMAGING — CT CT CHEST W/O CM
2 of 4 series · 15 of 36 positions shown, 18 images · non-contrast
Comparison: 11/16/2014, 01/29/2015

CLINICAL DATA: Persistent cough

EXAM:
CT CHEST WITHOUT CONTRAST
TECHNIQUE: Multidetector CT imaging of the chest was performed following the
standard protocol without IV contrast.

[Series 2: thorax 5.0 i31f 1 · axial · 0.73mm/px · z∈[+1122,+1422]mm · 12 of 72 slices shown, 15 images]
[im 6/72  mediastinal]
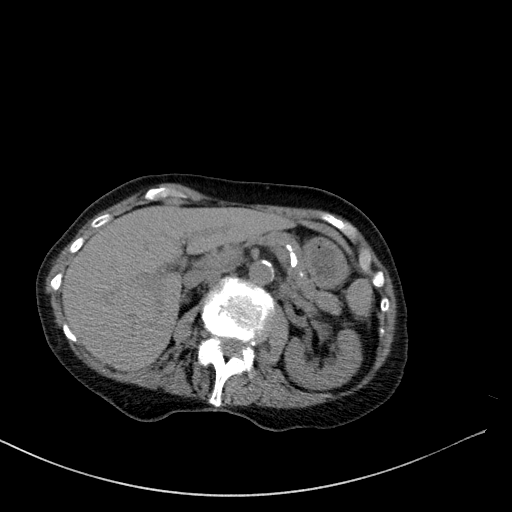
[im 6/72  lung]
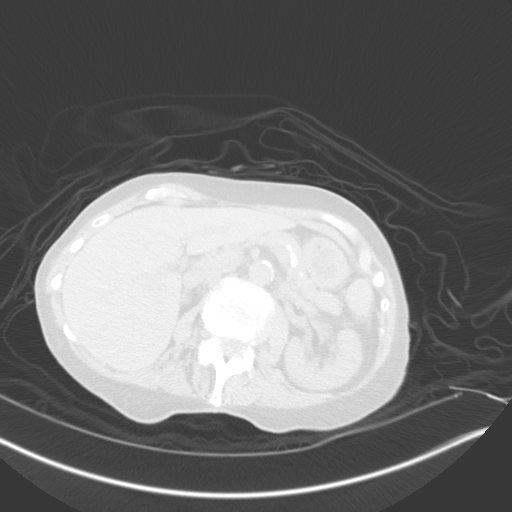
[im 11/72  lung]
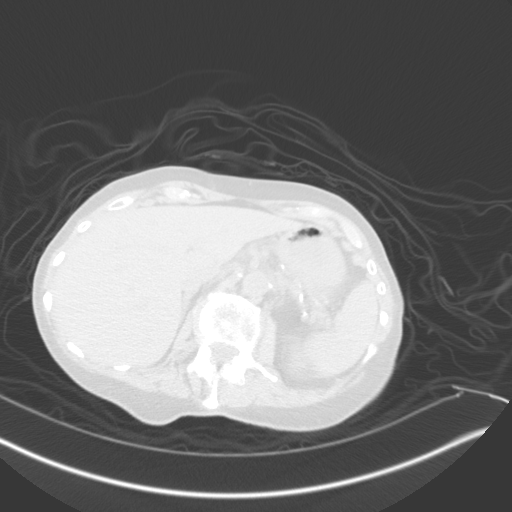
[im 16/72  lung]
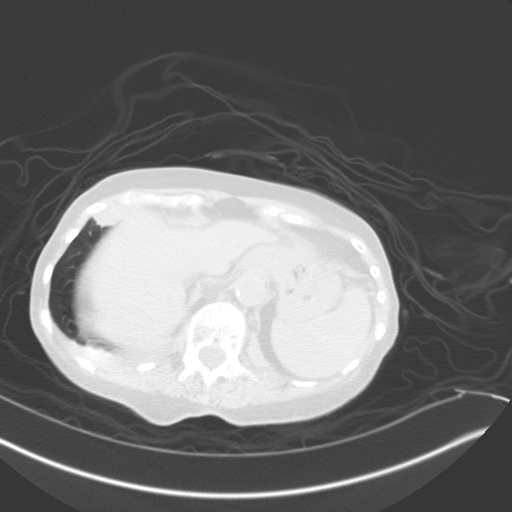
[im 21/72  lung]
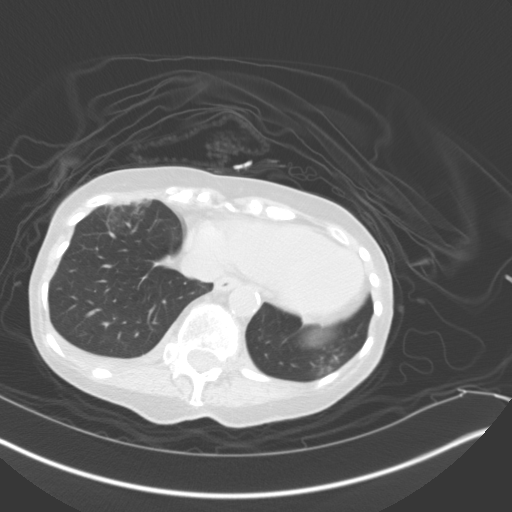
[im 26/72  mediastinal]
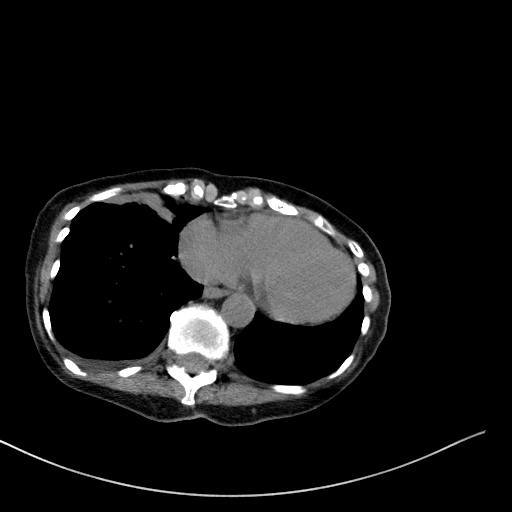
[im 26/72  lung]
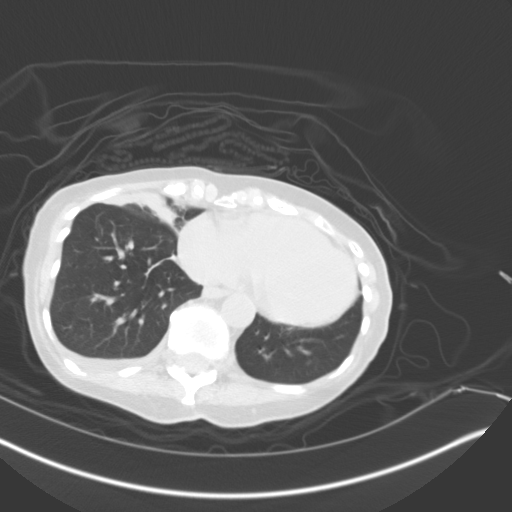
[im 31/72  lung]
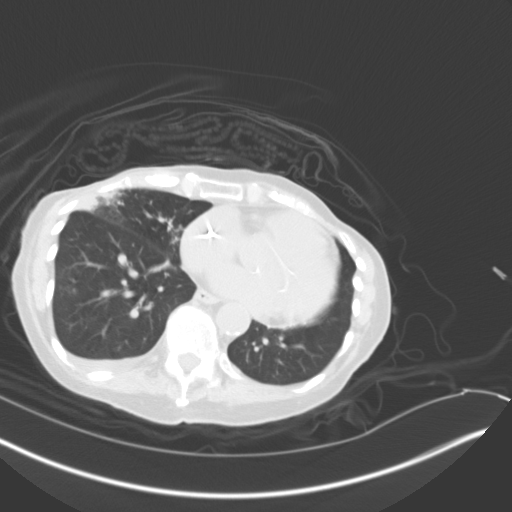
[im 41/72  lung]
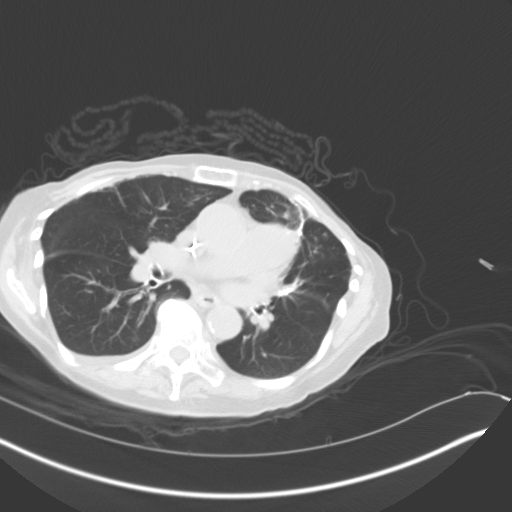
[im 46/72  lung]
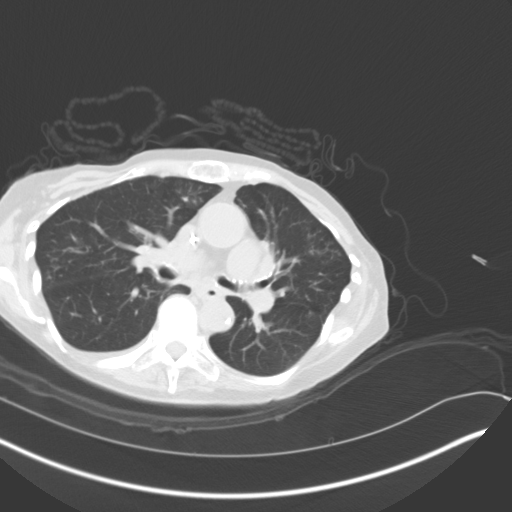
[im 51/72  mediastinal]
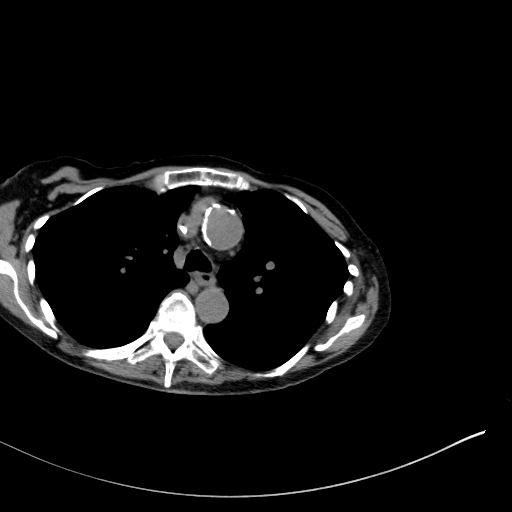
[im 51/72  lung]
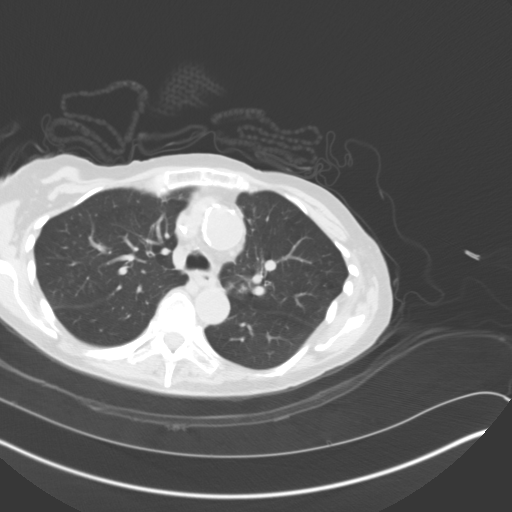
[im 56/72  lung]
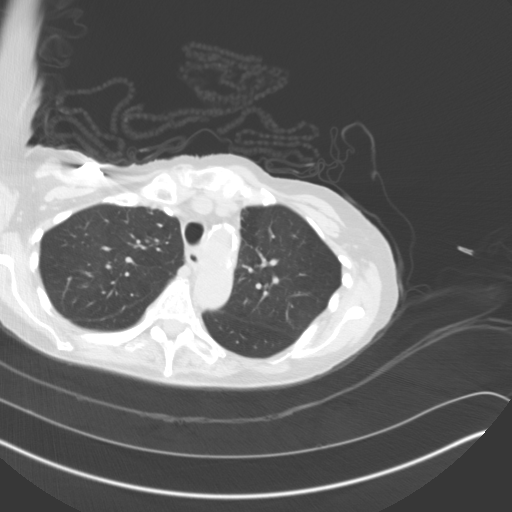
[im 61/72  lung]
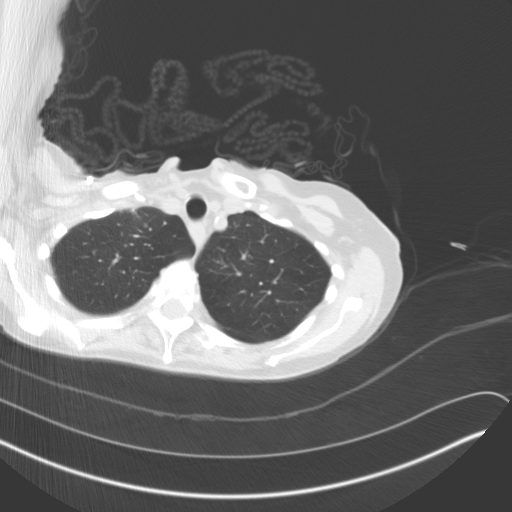
[im 66/72  lung]
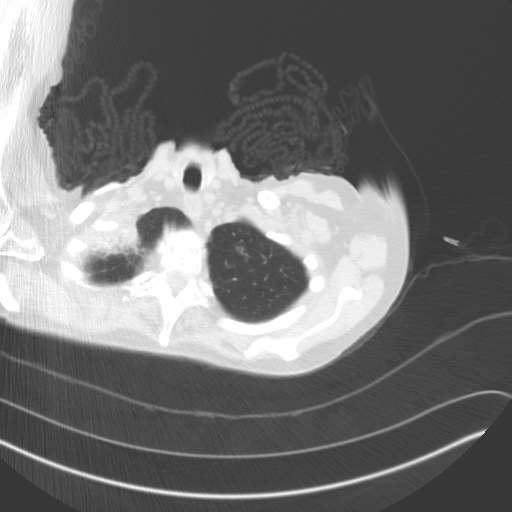

[Series 5: coronal · coronal · 0.70mm/px · 3 of 71 slices shown]
[im 15/71  lung]
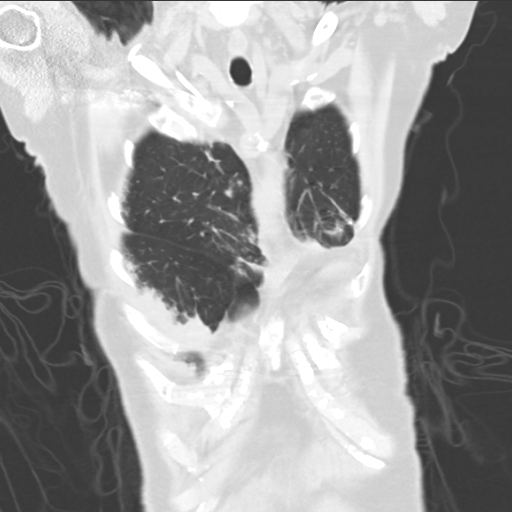
[im 29/71  lung]
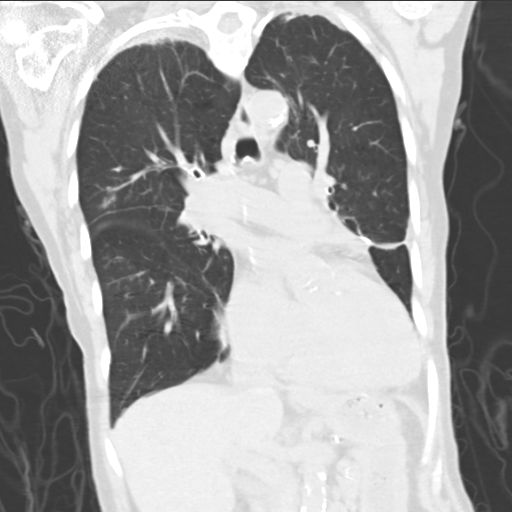
[im 43/71  lung]
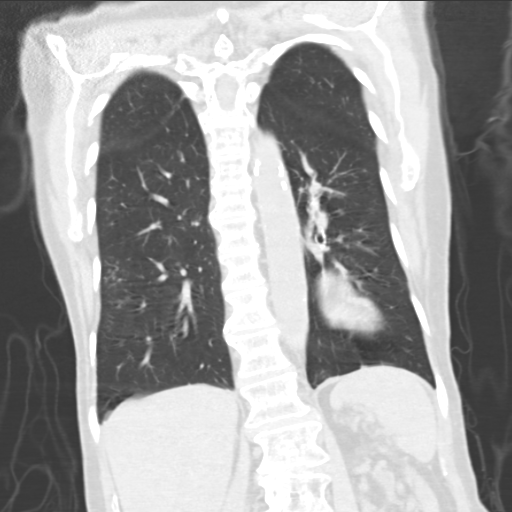

[15 of 36 positions shown; findings below may reference images not displayed]

FINDINGS: The lungs are well aerated bilaterally. Stable changes are noted in
the anterior aspect of the right upper lobe when compared with the
prior PET-CT examination. New areas of patchy infiltrate are noted
in the lateral aspect of the right middle lobe as well as the
anterior aspect of the right lower lobe. These are consistent with
acute pneumonic infiltrates. Minimal infiltrative changes are also
noted in the inferior aspect of the right upper lobe. Scarring is
again noted in the medial aspect of the right middle lobe and
lingula which is stable. A new small right-sided pleural effusion is
noted.

Aortic calcifications are seen without aneurysmal dilatation. A
right chest wall port is seen. No significant hilar or mediastinal
adenopathy is noted.

The upper abdomen reveals no acute abnormality. The osseous
structures show mild degenerative change without acute abnormality.
IMPRESSION: New areas of patchy infiltrate within the right lung as described.
Followup PA and lateral chest X-ray is recommended in 3-4 weeks
following trial of antibiotic therapy to ensure resolution and
exclude underlying malignancy.

No other focal abnormality is noted.

These results will be called to the ordering clinician or
representative by the Radiologist Assistant, and communication
documented in the PACS or [employer] Dashboard.
